# Patient Record
Sex: Male | Born: 1940
Health system: Southern US, Community
[De-identification: ages and names within clinical notes are randomized; demographics above are authoritative.]

## PROBLEM LIST (undated history)

## (undated) DIAGNOSIS — B029 Zoster without complications: Secondary | ICD-10-CM

## (undated) DIAGNOSIS — I639 Cerebral infarction, unspecified: Secondary | ICD-10-CM

## (undated) DIAGNOSIS — Z8619 Personal history of other infectious and parasitic diseases: Secondary | ICD-10-CM

## (undated) DIAGNOSIS — I10 Essential (primary) hypertension: Secondary | ICD-10-CM

## (undated) DIAGNOSIS — Z8673 Personal history of transient ischemic attack (TIA), and cerebral infarction without residual deficits: Secondary | ICD-10-CM

## (undated) DIAGNOSIS — N400 Enlarged prostate without lower urinary tract symptoms: Secondary | ICD-10-CM

## (undated) DIAGNOSIS — I252 Old myocardial infarction: Secondary | ICD-10-CM

## (undated) DIAGNOSIS — I2510 Atherosclerotic heart disease of native coronary artery without angina pectoris: Secondary | ICD-10-CM

## (undated) DIAGNOSIS — N529 Male erectile dysfunction, unspecified: Secondary | ICD-10-CM

## (undated) DIAGNOSIS — K449 Diaphragmatic hernia without obstruction or gangrene: Secondary | ICD-10-CM

## (undated) DIAGNOSIS — E785 Hyperlipidemia, unspecified: Secondary | ICD-10-CM

## (undated) HISTORY — PX: CORONARY ANGIOPLASTY: SHX604

## (undated) HISTORY — DX: Personal history of other infectious and parasitic diseases: Z86.19

## (undated) HISTORY — DX: Old myocardial infarction: I25.2

## (undated) HISTORY — DX: Personal history of transient ischemic attack (TIA), and cerebral infarction without residual deficits: Z86.73

## (undated) HISTORY — DX: Hyperlipidemia, unspecified: E78.5

## (undated) HISTORY — DX: Benign prostatic hyperplasia without lower urinary tract symptoms: N40.0

## (undated) HISTORY — PX: CARDIAC CATHETERIZATION: SHX172

## (undated) HISTORY — DX: Male erectile dysfunction, unspecified: N52.9

## (undated) HISTORY — DX: Essential (primary) hypertension: I10

## (undated) HISTORY — DX: Zoster without complications: B02.9

---

## 1998-02-05 ENCOUNTER — Inpatient Hospital Stay (HOSPITAL_COMMUNITY): Admission: EM | Admit: 1998-02-05 | Discharge: 1998-02-09 | Payer: Self-pay | Admitting: Emergency Medicine

## 1998-03-25 ENCOUNTER — Encounter (HOSPITAL_COMMUNITY): Admission: RE | Admit: 1998-03-25 | Discharge: 1998-06-23 | Payer: Self-pay | Admitting: *Deleted

## 1998-06-24 ENCOUNTER — Encounter (HOSPITAL_COMMUNITY): Admission: RE | Admit: 1998-06-24 | Discharge: 1998-09-22 | Payer: Self-pay | Admitting: *Deleted

## 2004-04-13 ENCOUNTER — Ambulatory Visit: Payer: Self-pay | Admitting: Cardiology

## 2004-04-16 ENCOUNTER — Ambulatory Visit: Payer: Self-pay | Admitting: Cardiology

## 2004-05-03 DIAGNOSIS — I639 Cerebral infarction, unspecified: Secondary | ICD-10-CM

## 2004-05-03 HISTORY — DX: Cerebral infarction, unspecified: I63.9

## 2004-08-06 ENCOUNTER — Ambulatory Visit: Payer: Self-pay | Admitting: Cardiology

## 2004-10-23 ENCOUNTER — Ambulatory Visit: Payer: Self-pay | Admitting: Physical Medicine & Rehabilitation

## 2004-10-23 ENCOUNTER — Inpatient Hospital Stay (HOSPITAL_COMMUNITY): Admission: EM | Admit: 2004-10-23 | Discharge: 2004-10-30 | Payer: Self-pay | Admitting: *Deleted

## 2004-10-23 ENCOUNTER — Ambulatory Visit: Payer: Self-pay | Admitting: Cardiology

## 2004-10-23 ENCOUNTER — Encounter: Payer: Self-pay | Admitting: Cardiology

## 2004-10-30 ENCOUNTER — Inpatient Hospital Stay (HOSPITAL_COMMUNITY)
Admission: RE | Admit: 2004-10-30 | Discharge: 2004-12-09 | Payer: Self-pay | Admitting: Physical Medicine & Rehabilitation

## 2004-11-06 ENCOUNTER — Ambulatory Visit: Payer: Self-pay | Admitting: Cardiology

## 2005-01-27 ENCOUNTER — Encounter
Admission: RE | Admit: 2005-01-27 | Discharge: 2005-04-27 | Payer: Self-pay | Admitting: Physical Medicine & Rehabilitation

## 2005-03-17 ENCOUNTER — Ambulatory Visit: Payer: Self-pay | Admitting: Cardiology

## 2005-04-15 ENCOUNTER — Ambulatory Visit: Payer: Self-pay | Admitting: Cardiology

## 2005-07-21 ENCOUNTER — Ambulatory Visit: Payer: Self-pay | Admitting: Cardiology

## 2005-10-13 ENCOUNTER — Ambulatory Visit: Payer: Self-pay | Admitting: Family Medicine

## 2005-11-15 ENCOUNTER — Ambulatory Visit: Payer: Self-pay | Admitting: Internal Medicine

## 2005-12-10 ENCOUNTER — Ambulatory Visit: Payer: Self-pay | Admitting: Cardiology

## 2006-01-17 ENCOUNTER — Ambulatory Visit: Payer: Self-pay | Admitting: Internal Medicine

## 2006-01-28 ENCOUNTER — Ambulatory Visit: Payer: Self-pay | Admitting: Internal Medicine

## 2006-02-16 ENCOUNTER — Emergency Department (HOSPITAL_COMMUNITY): Admission: EM | Admit: 2006-02-16 | Discharge: 2006-02-16 | Payer: Self-pay | Admitting: *Deleted

## 2006-03-17 ENCOUNTER — Ambulatory Visit: Payer: Self-pay | Admitting: Internal Medicine

## 2006-03-17 LAB — CONVERTED CEMR LAB
Albumin: 3.6 g/dL (ref 3.5–5.2)
Alkaline Phosphatase: 84 units/L (ref 39–117)
LDL DIRECT: 206.8 mg/dL
Total Protein: 6.2 g/dL (ref 6.0–8.3)
Triglyceride fasting, serum: 138 mg/dL (ref 0–149)

## 2006-07-14 ENCOUNTER — Ambulatory Visit: Payer: Self-pay | Admitting: Internal Medicine

## 2006-07-14 LAB — CONVERTED CEMR LAB
Eosinophils Absolute: 0.2 10*3/uL (ref 0.0–0.6)
Eosinophils Relative: 2.8 % (ref 0.0–5.0)
Folate: >20 ng/mL
HCT: 42.2 % (ref 39.0–52.0)
LDL Cholesterol: 84 mg/dL (ref 0–99)
Lymphocytes Relative: 30.7 % (ref 12.0–46.0)
Monocytes Absolute: 0.5 10*3/uL (ref 0.2–0.7)
Monocytes Relative: 8.2 % (ref 3.0–11.0)
Neutro Abs: 3.7 10*3/uL (ref 1.4–7.7)
Platelets: 268 10*3/uL (ref 150–400)
RDW: 12.4 % (ref 11.5–14.6)
Triglycerides: 146 mg/dL (ref 0–149)
VLDL: 29 mg/dL (ref 0–40)
WBC: 6.3 10*3/uL (ref 4.5–10.5)

## 2006-08-04 ENCOUNTER — Encounter: Payer: Self-pay | Admitting: Internal Medicine

## 2006-10-10 ENCOUNTER — Ambulatory Visit: Payer: Self-pay | Admitting: Internal Medicine

## 2006-10-10 LAB — CONVERTED CEMR LAB
ALT: 20 units/L (ref 0–40)
Albumin: 3.5 g/dL (ref 3.5–5.2)
Alkaline Phosphatase: 80 units/L (ref 39–117)
Cholesterol: 141 mg/dL (ref 0–200)
LDL Cholesterol: 88 mg/dL (ref 0–99)
PSA: 3.92 ng/mL (ref 0.10–4.00)
Total Bilirubin: 0.5 mg/dL (ref 0.3–1.2)
Total Protein: 5.9 g/dL — ABNORMAL LOW (ref 6.0–8.3)
Triglycerides: 120 mg/dL (ref 0–149)

## 2006-11-28 ENCOUNTER — Telehealth: Payer: Self-pay | Admitting: Internal Medicine

## 2006-12-13 ENCOUNTER — Telehealth (INDEPENDENT_AMBULATORY_CARE_PROVIDER_SITE_OTHER): Payer: Self-pay | Admitting: *Deleted

## 2007-01-05 DIAGNOSIS — I252 Old myocardial infarction: Secondary | ICD-10-CM | POA: Insufficient documentation

## 2007-01-05 DIAGNOSIS — N400 Enlarged prostate without lower urinary tract symptoms: Secondary | ICD-10-CM | POA: Insufficient documentation

## 2007-01-05 DIAGNOSIS — Z86718 Personal history of other venous thrombosis and embolism: Secondary | ICD-10-CM | POA: Insufficient documentation

## 2007-01-05 DIAGNOSIS — G9511 Acute infarction of spinal cord (embolic) (nonembolic): Secondary | ICD-10-CM | POA: Insufficient documentation

## 2007-01-05 DIAGNOSIS — E785 Hyperlipidemia, unspecified: Secondary | ICD-10-CM | POA: Insufficient documentation

## 2007-01-05 DIAGNOSIS — I10 Essential (primary) hypertension: Secondary | ICD-10-CM | POA: Insufficient documentation

## 2007-01-09 ENCOUNTER — Ambulatory Visit: Payer: Self-pay | Admitting: Internal Medicine

## 2007-01-09 DIAGNOSIS — N390 Urinary tract infection, site not specified: Secondary | ICD-10-CM | POA: Insufficient documentation

## 2007-03-06 ENCOUNTER — Telehealth: Payer: Self-pay | Admitting: Internal Medicine

## 2007-03-16 ENCOUNTER — Telehealth (INDEPENDENT_AMBULATORY_CARE_PROVIDER_SITE_OTHER): Payer: Self-pay | Admitting: *Deleted

## 2007-04-11 ENCOUNTER — Telehealth: Payer: Self-pay | Admitting: Internal Medicine

## 2007-05-01 ENCOUNTER — Telehealth: Payer: Self-pay | Admitting: Internal Medicine

## 2007-05-16 ENCOUNTER — Ambulatory Visit: Payer: Self-pay | Admitting: Internal Medicine

## 2007-05-16 DIAGNOSIS — N529 Male erectile dysfunction, unspecified: Secondary | ICD-10-CM

## 2007-05-16 HISTORY — DX: Male erectile dysfunction, unspecified: N52.9

## 2007-05-16 LAB — CONVERTED CEMR LAB
ALT: 25 units/L (ref 0–53)
AST: 22 units/L (ref 0–37)
Alkaline Phosphatase: 76 units/L (ref 39–117)
Bilirubin, Direct: 0.2 mg/dL (ref 0.0–0.3)
Cholesterol: 135 mg/dL (ref 0–200)
LDL Goal: 100 mg/dL
Total CHOL/HDL Ratio: 4.2
VLDL: 23 mg/dL (ref 0–40)

## 2007-06-08 ENCOUNTER — Telehealth: Payer: Self-pay | Admitting: *Deleted

## 2007-08-07 ENCOUNTER — Telehealth: Payer: Self-pay | Admitting: Internal Medicine

## 2007-08-24 ENCOUNTER — Ambulatory Visit: Payer: Self-pay | Admitting: Internal Medicine

## 2007-08-24 DIAGNOSIS — K219 Gastro-esophageal reflux disease without esophagitis: Secondary | ICD-10-CM | POA: Insufficient documentation

## 2007-11-16 ENCOUNTER — Ambulatory Visit: Payer: Self-pay | Admitting: Internal Medicine

## 2007-11-16 DIAGNOSIS — T887XXA Unspecified adverse effect of drug or medicament, initial encounter: Secondary | ICD-10-CM | POA: Insufficient documentation

## 2007-11-16 LAB — CONVERTED CEMR LAB
ALT: 20 units/L (ref 0–53)
AST: 18 units/L (ref 0–37)
Alkaline Phosphatase: 78 units/L (ref 39–117)
Total Protein: 6.1 g/dL (ref 6.0–8.3)

## 2007-11-23 ENCOUNTER — Ambulatory Visit: Payer: Self-pay | Admitting: Internal Medicine

## 2008-02-14 ENCOUNTER — Ambulatory Visit: Payer: Self-pay | Admitting: Internal Medicine

## 2008-02-14 LAB — CONVERTED CEMR LAB
ALT: 20 units/L (ref 0–53)
Bilirubin, Direct: 0.2 mg/dL (ref 0.0–0.3)
Cholesterol: 153 mg/dL (ref 0–200)
HDL: 32.9 mg/dL — ABNORMAL LOW (ref >39.0)
LDL Cholesterol: 97 mg/dL (ref 0–99)
Total CHOL/HDL Ratio: 4.7
Triglycerides: 115 mg/dL (ref 0–149)
VLDL: 23 mg/dL (ref 0–40)

## 2008-02-21 ENCOUNTER — Ambulatory Visit: Payer: Self-pay | Admitting: Internal Medicine

## 2008-05-23 ENCOUNTER — Ambulatory Visit: Payer: Self-pay | Admitting: Internal Medicine

## 2008-05-23 DIAGNOSIS — I2510 Atherosclerotic heart disease of native coronary artery without angina pectoris: Secondary | ICD-10-CM | POA: Insufficient documentation

## 2008-05-23 LAB — CONVERTED CEMR LAB
AST: 21 units/L (ref 0–37)
Alkaline Phosphatase: 80 units/L (ref 39–117)
BUN: 19 mg/dL (ref 6–23)
Basophils Relative: 0.1 % (ref 0.0–3.0)
Bilirubin, Direct: 0.1 mg/dL (ref 0.0–0.3)
CRP, High Sensitivity: <1 — ABNORMAL LOW (ref 0.00–5.00)
Calcium: 9.3 mg/dL (ref 8.4–10.5)
Cholesterol: 156 mg/dL (ref 0–200)
Creatinine, Ser: 0.8 mg/dL (ref 0.4–1.5)
GFR calc Af Amer: 125 mL/min
GFR calc non Af Amer: 103 mL/min
Glucose, Bld: 80 mg/dL (ref 70–99)
HCT: 42.1 % (ref 39.0–52.0)
LDL Cholesterol: 93 mg/dL (ref 0–99)
Lymphocytes Relative: 30.7 % (ref 12.0–46.0)
MCHC: 34.4 g/dL (ref 30.0–36.0)
Neutro Abs: 3.1 10*3/uL (ref 1.4–7.7)
PSA: 0.79 ng/mL (ref 0.10–4.00)
RDW: 12.1 % (ref 11.5–14.6)
Sodium: 141 meq/L (ref 135–145)
Total Bilirubin: 0.9 mg/dL (ref 0.3–1.2)
VLDL: 24 mg/dL (ref 0–40)
WBC: 5.5 10*3/uL (ref 4.5–10.5)

## 2008-05-29 ENCOUNTER — Encounter: Payer: Self-pay | Admitting: Internal Medicine

## 2008-05-29 ENCOUNTER — Ambulatory Visit: Payer: Self-pay

## 2008-06-27 ENCOUNTER — Telehealth: Payer: Self-pay | Admitting: *Deleted

## 2008-10-17 ENCOUNTER — Ambulatory Visit: Payer: Self-pay | Admitting: Internal Medicine

## 2008-10-17 DIAGNOSIS — E538 Deficiency of other specified B group vitamins: Secondary | ICD-10-CM | POA: Insufficient documentation

## 2008-10-17 LAB — CONVERTED CEMR LAB
Albumin: 3.8 g/dL (ref 3.5–5.2)
Alkaline Phosphatase: 81 units/L (ref 39–117)
Basophils Relative: 0 % (ref 0.0–3.0)
CO2: 28 meq/L (ref 19–32)
Direct LDL: 95.5 mg/dL
Eosinophils Absolute: 0.2 10*3/uL (ref 0.0–0.7)
Eosinophils Relative: 3 % (ref 0.0–5.0)
GFR calc non Af Amer: 102.82 mL/min (ref >60)
HDL: 38.8 mg/dL — ABNORMAL LOW (ref >39.00)
Lymphocytes Relative: 30 % (ref 12.0–46.0)
Monocytes Absolute: 0.5 10*3/uL (ref 0.1–1.0)
Monocytes Relative: 8.5 % (ref 3.0–12.0)
Potassium: 4.2 meq/L (ref 3.5–5.1)
RDW: 12.1 % (ref 11.5–14.6)
Sodium: 140 meq/L (ref 135–145)
Total Bilirubin: 0.9 mg/dL (ref 0.3–1.2)

## 2009-02-18 ENCOUNTER — Ambulatory Visit: Payer: Self-pay | Admitting: Internal Medicine

## 2009-02-18 LAB — CONVERTED CEMR LAB
Basophils Relative: 0.5 % (ref 0.0–3.0)
Lymphocytes Relative: 28.2 % (ref 12.0–46.0)
Lymphs Abs: 1.8 10*3/uL (ref 0.7–4.0)
MCHC: 34.3 g/dL (ref 30.0–36.0)
Monocytes Absolute: 0.5 10*3/uL (ref 0.1–1.0)
Neutro Abs: 4 10*3/uL (ref 1.4–7.7)
Neutrophils Relative %: 59.4 % (ref 43.0–77.0)
Platelets: 232 10*3/uL (ref 150.0–400.0)
RBC: 4.7 M/uL (ref 4.22–5.81)
RDW: 12.3 % (ref 11.5–14.6)
WBC: 6.5 10*3/uL (ref 4.5–10.5)

## 2009-04-05 ENCOUNTER — Telehealth: Payer: Self-pay | Admitting: Family Medicine

## 2009-04-07 ENCOUNTER — Ambulatory Visit: Payer: Self-pay | Admitting: Internal Medicine

## 2009-04-07 ENCOUNTER — Encounter (INDEPENDENT_AMBULATORY_CARE_PROVIDER_SITE_OTHER): Payer: Self-pay | Admitting: *Deleted

## 2009-04-07 ENCOUNTER — Telehealth: Payer: Self-pay | Admitting: Internal Medicine

## 2009-04-07 DIAGNOSIS — G909 Disorder of the autonomic nervous system, unspecified: Secondary | ICD-10-CM | POA: Insufficient documentation

## 2009-04-07 DIAGNOSIS — B029 Zoster without complications: Secondary | ICD-10-CM

## 2009-04-07 DIAGNOSIS — Z8619 Personal history of other infectious and parasitic diseases: Secondary | ICD-10-CM

## 2009-04-07 HISTORY — DX: Personal history of other infectious and parasitic diseases: Z86.19

## 2009-04-07 HISTORY — DX: Zoster without complications: B02.9

## 2009-04-08 ENCOUNTER — Telehealth: Payer: Self-pay | Admitting: Internal Medicine

## 2009-04-21 ENCOUNTER — Telehealth (INDEPENDENT_AMBULATORY_CARE_PROVIDER_SITE_OTHER): Payer: Self-pay | Admitting: *Deleted

## 2009-04-21 ENCOUNTER — Telehealth: Payer: Self-pay | Admitting: Internal Medicine

## 2009-07-07 ENCOUNTER — Telehealth: Payer: Self-pay | Admitting: Internal Medicine

## 2009-08-19 ENCOUNTER — Ambulatory Visit: Payer: Self-pay | Admitting: Internal Medicine

## 2009-08-19 LAB — CONVERTED CEMR LAB
ALT: 22 units/L (ref 0–53)
Albumin: 3.9 g/dL (ref 3.5–5.2)
Alkaline Phosphatase: 80 units/L (ref 39–117)
BUN: 14 mg/dL (ref 6–23)
Basophils Absolute: 0 10*3/uL (ref 0.0–0.1)
Basophils Relative: 0.6 % (ref 0.0–3.0)
Chloride: 105 meq/L (ref 96–112)
Eosinophils Absolute: 0.2 10*3/uL (ref 0.0–0.7)
Eosinophils Relative: 3.4 % (ref 0.0–5.0)
GFR calc non Af Amer: 89.52 mL/min (ref >60)
HDL: 44.3 mg/dL (ref >39.00)
Lymphocytes Relative: 29.3 % (ref 12.0–46.0)
Lymphs Abs: 1.8 10*3/uL (ref 0.7–4.0)
Platelets: 232 10*3/uL (ref 150.0–400.0)
Total Bilirubin: 0.5 mg/dL (ref 0.3–1.2)
Total Protein: 6.6 g/dL (ref 6.0–8.3)
WBC: 6 10*3/uL (ref 4.5–10.5)

## 2009-10-31 ENCOUNTER — Ambulatory Visit: Payer: Self-pay | Admitting: Internal Medicine

## 2009-12-09 ENCOUNTER — Telehealth: Payer: Self-pay | Admitting: *Deleted

## 2009-12-10 ENCOUNTER — Encounter: Payer: Self-pay | Admitting: Internal Medicine

## 2010-02-16 ENCOUNTER — Ambulatory Visit: Payer: Self-pay | Admitting: Internal Medicine

## 2010-02-16 LAB — CONVERTED CEMR LAB: Direct LDL: 90.2 mg/dL

## 2010-02-25 DIAGNOSIS — G9519 Other vascular myelopathies: Secondary | ICD-10-CM | POA: Insufficient documentation

## 2010-03-03 ENCOUNTER — Telehealth: Payer: Self-pay | Admitting: Internal Medicine

## 2010-03-05 ENCOUNTER — Telehealth: Payer: Self-pay | Admitting: Internal Medicine

## 2010-04-20 ENCOUNTER — Encounter: Payer: Self-pay | Admitting: Internal Medicine

## 2010-04-24 ENCOUNTER — Telehealth: Payer: Self-pay | Admitting: Internal Medicine

## 2010-05-08 ENCOUNTER — Encounter: Payer: Self-pay | Admitting: Internal Medicine

## 2010-06-02 NOTE — Miscellaneous (Signed)
Summary: Orders Update  Clinical Lists Changes  Orders: Added new Service order of UA Dipstick w/Micro (automated) (81001) - Signed  Appended Document: Orders Update  Laboratory Results   Urine Tests    Routine Urinalysis   Color: yellow Appearance: Clear Glucose: negative   (Normal Range: Negative) Bilirubin: negative   (Normal Range: Negative) Ketone: negative   (Normal Range: Negative) Spec. Gravity: 1.020   (Normal Range: 1.003-1.035) Blood: trace-lysed   (Normal Range: Negative) pH: 7.0   (Normal Range: 5.0-8.0) Protein: negative   (Normal Range: Negative) Urobilinogen: 0.2   (Normal Range: 0-1) Nitrite: negative   (Normal Range: Negative) Leukocyte Esterace: trace   (Normal Range: Negative)    Comments: Rita Ohara  October 31, 2009 2:33 PM

## 2010-06-02 NOTE — Progress Notes (Signed)
Summary:  fyi---requesting rx  Phone Note Call from Patient Call back at Home Phone [phone] Call back at [phone]   Caller: Spouse---triage vm Summary of Call: medical supply office will be contacting the office for rx of catherters. he would need a 90 day supply, because he uses them everyday. Initial call taken by: Warnell Forester,  March 03, 2010 2:59 PM  Follow-up for Phone Call        ok Follow-up by: Willy Eddy, LPN,  March 03, 2010 3:01 PM

## 2010-06-02 NOTE — Medication Information (Signed)
Summary: Geophysical data processor   Imported By: Maryln Gottron 12/11/2009 14:49:48  _____________________________________________________________________  External Attachment:    Type:   Image     Comment:   External Document

## 2010-06-02 NOTE — Progress Notes (Signed)
Summary: WC cushion  Phone Note From Other Clinic   Caller: [facility],DME provider,[phone] Summary of Call: Patient requesting WC cusion change,other one worn out according to wife.  Faxing request.   Initial call taken by: Rudy Jew, RN,  December 09, 2009 1:24 PM

## 2010-06-02 NOTE — Assessment & Plan Note (Signed)
Summary: 6 month follow up/cjr   Vital Signs:  Patient profile:   70 year old male Temp:     98.2 degrees F oral Pulse rate:   72 / minute Pulse rhythm:   regular Resp:     14 per minute BP sitting:   124 / 78  (left arm)  Vitals Entered By: Willy Eddy, LPN (August 19, 2009 10:16 AM) CC: roa, Hypertension Management, Lipid Management   CC:  roa, Hypertension Management, and Lipid Management.  History of Present Illness: Follow up still participating the PT mood is stable blood pressure is stable  Hypertension History:      He denies headache, chest pain, palpitations, dyspnea with exertion, orthopnea, PND, peripheral edema, visual symptoms, neurologic problems, syncope, and side effects from treatment.        Positive major cardiovascular risk factors include male age 41 years old or older, hyperlipidemia, and hypertension.  Negative major cardiovascular risk factors include negative family history for ischemic heart disease and non-tobacco-user status.        Positive history for target organ damage include ASHD (either angina/prior MI/prior CABG) and prior stroke (or TIA).  Further assessment for target organ damage reveals no history of peripheral vascular disease.    Lipid Management History:      Positive NCEP/ATP III risk factors include male age 58 years old or older, HDL cholesterol less than 40, hypertension, ASHD (either angina/prior MI/prior CABG), and prior stroke (or TIA).  Negative NCEP/ATP III risk factors include no family history for ischemic heart disease, non-tobacco-user status, no peripheral vascular disease, and no history of aortic aneurysm.      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 One By Mouth Three Times A Day As Needed 2)  Neurpath-B 3-35-2 Mg Tabs (L-Methylfolate-B6-B12) .Marland Kitchen.. 1 Two Times A Day 3)  Bl Aspirin 325 Mg  Tabs (Aspirin) .... Once Daily 4)  Metoprolol Tartrate 25 Mg  Tabs (Metoprolol Tartrate) .... 1/2 By Mouth Two Times A Day 5)  Viagra 100 Mg  Tabs (Sildenafil Citrate) .... One By Mouth Daily Prn 6)  Cipro 500 Mg  Tabs (Ciprofloxacin Hcl) .... Two Times A Day As Needed 7)  Omeprazole 20 Mg  Tbec (Omeprazole) .Marland Kitchen.. 1 Once Daily 8)  Zocor 40 Mg  Tabs (Simvastatin) .... One By Mouth  Daily 9)  Medrol (Pak) 4 Mg Tabs (Methylprednisolone) .... Take As Directed For 5 Days 10)  Bactroban 2 % Oint (Mupirocin) .... Apply Two Times A Day & As Needed To Affected Area 11)  Amitriptyline Hcl 25 Mg Tabs (Amitriptyline Hcl) .... One By Mouth Q Hs  Allergies (verified): No Known Drug Allergies  Past History:  Family History: Last updated: 05/16/2007 Family History of Alcoholism/Addiction Family History High cholesterol Family History Hypertension Family History of Cardiovascular disorder  father in 25's  Social History: Last updated: 01/05/2007 Occupation: Self employed Married Former Smoker  Risk Factors: Smoking Status: quit (08/19/2009) Passive Smoke Exposure: no (08/19/2009)  Past medical, surgical, family and social histories (including risk factors) reviewed, and no changes noted (except as noted below).  Past  Medical History: Reviewed history from 05/16/2007 and no changes required. Hyperlipidemia Hypertension Benign prostatic hypertrophy Cerebrovascular accident, hx of DVT, hx of Myocardial infarction, hx of CVA  Past Surgical History: Reviewed history from 01/09/2007 and no changes required. Denies surgical history  Family History: Reviewed history from 05/16/2007 and no changes required. Family History of Alcoholism/Addiction Family History High cholesterol Family History Hypertension Family History of Cardiovascular disorder  father in 31's  Social History: Reviewed history from 01/05/2007 and no changes required. Occupation: Self employed Married Former Smoker  Review of Systems  The patient denies anorexia, fever, weight loss, weight gain, vision loss, decreased hearing, hoarseness, chest pain, syncope, dyspnea on exertion, peripheral edema, prolonged cough, headaches, hemoptysis, abdominal pain, melena, hematochezia, severe indigestion/heartburn, hematuria, incontinence, genital sores, muscle weakness, suspicious skin lesions, transient blindness, difficulty walking, depression, unusual weight change, abnormal bleeding, enlarged lymph nodes, angioedema, breast masses, and testicular masses.    Physical Exam  General:  alert and pale.  well-developed.   Head:  atraumatic and male-pattern balding.   Eyes:  pupils equal, pupils round, and pupils reactive to light.   Ears:  R ear normal and L ear normal.   Nose:  rash with vesicles Neck:  No deformities, masses, or tenderness noted. Lungs:  Normal respiratory effort, chest expands symmetrically. Lungs are clear to auscultation, no crackles or wheezes. Heart:  normal rate and regular rhythm.   Abdomen:  Bowel sounds positive,abdomen soft and non-tender without masses, organomegaly or hernias noted.   Impression & Recommendations:  Problem # 1:  HYPERTENSION (ICD-401.9) Assessment Improved  His updated medication list for  this problem includes:    Metoprolol Tartrate 25 Mg Tabs (Metoprolol tartrate) .Marland Kitchen... 1/2 by mouth two times a day  BP today: 124/78 Prior BP: 130/80 (04/07/2009)  Prior 10 Yr Risk Heart Disease: N/A (01/09/2007)  Labs Reviewed: K+: 4.2 (10/17/2008) Creat: : 0.8 (10/17/2008)   Chol: 150 (10/17/2008)   HDL: 38.80 (10/17/2008)   LDL: 93 (05/23/2008)   TG: 120 (05/23/2008)  Orders: TLB-BMP (Basic Metabolic Panel-BMET) (80048-METABOL) Prescription Created Electronically [phone])  Problem # 2:  GERD (ICD-530.81) Assessment: Unchanged  has to take the priolosec daily but if overeat or lays down too soon has increased symtoms His updated medication list for this problem includes:    Omeprazole 20 Mg Tbec (Omeprazole) .Marland Kitchen... 1 once daily  Labs Reviewed: Hgb: 14.7 (02/18/2009)   Hct: 42.9 (02/18/2009)  Orders: Venipuncture (56213) TLB-CBC Platelet - w/Differential (85025-CBCD)  Problem # 3:  HYPERLIPIDEMIA (ICD-272.4)  His updated medication list for this problem includes:    Zocor 40 Mg Tabs (Simvastatin) ..... One by mouth  daily  Labs Reviewed: SGOT: 18 (10/17/2008)   SGPT: 22 (10/17/2008)  Lipid Goals: Chol Goal: 200 (05/16/2007)   HDL Goal: 40 (05/16/2007)   LDL Goal: 100 (05/16/2007)   TG Goal: 150 (05/16/2007)  Prior 10 Yr Risk Heart Disease: N/A (01/09/2007)   HDL:38.80 (10/17/2008), 38.8 (05/23/2008)  LDL:93 (05/23/2008), 97 (02/14/2008)  Chol:150 (10/17/2008), 156 (05/23/2008)  Trig:120 (05/23/2008), 115 (02/14/2008)  Orders: TLB-Cholesterol, HDL (83718-HDL) TLB-Cholesterol, Direct LDL (83721-DIRLDL) TLB-Cholesterol,  Total (82465-CHO)  Complete Medication List: 1)  Antivert 25 Mg Tabs (Meclizine hcl) .... One by mouth three times a day as needed 2)  Neurpath-b 3-35-2 Mg Tabs (L-methylfolate-b6-b12) .Marland Kitchen.. 1 two times a day 3)  Bl Aspirin 325 Mg Tabs (Aspirin) .... Once daily 4)  Metoprolol Tartrate 25 Mg Tabs (Metoprolol tartrate) .... 1/2 by mouth two times a day 5)   Viagra 100 Mg Tabs (Sildenafil citrate) .... One by mouth daily prn 6)  Cipro 500 Mg Tabs (Ciprofloxacin hcl) .... Two times a day as needed 7)  Omeprazole 20 Mg Tbec (Omeprazole) .Marland Kitchen.. 1 once daily 8)  Zocor 40 Mg Tabs (Simvastatin) .... One by mouth  daily 9)  Medrol (pak) 4 Mg Tabs (Methylprednisolone) .... Take as directed for 5 days 10)  Bactroban 2 % Oint (Mupirocin) .... Apply two times a day & as needed to affected area 11)  Amitriptyline Hcl 25 Mg Tabs (Amitriptyline hcl) .... One by mouth q hs  Other Orders: TLB-Hepatic/Liver Function Pnl (80076-HEPATIC)  Hypertension Assessment/Plan:      The patient's hypertensive risk group is category C: Target organ damage and/or diabetes.  Today's blood pressure is 124/78.  His blood pressure goal is < 140/90.  Lipid Assessment/Plan:      Based on NCEP/ATP III, the patient's risk factor category is "history of coronary disease, peripheral vascular disease, cerebrovascular disease, or aortic aneurysm".  The patient's lipid goals are as follows: Total cholesterol goal is 200; LDL cholesterol goal is 100; HDL cholesterol goal is 40; Triglyceride goal is 150.  His LDL cholesterol goal has been met.    Patient Instructions: 1)  will send a cup home for ua 2)  Please schedule a follow-up appointment in 6 months. Prescriptions: ZOCOR 40 MG  TABS (SIMVASTATIN) ONE by mouth  DAILY  #90 x 2   Entered by:   Willy Eddy, LPN   Authorized by:   [name] MD   Signed by:   Willy Eddy, LPN on 81/19/1478   Method used:   [facility]        [facility] (retail)       [address]       [place], [place]  [address]       Ph: [phone] or [phone]       Fax: [phone]   RxID:   [id number] CIPRO 500 MG  TABS (CIPROFLOXACIN HCL) two times a day as needed  #20 Tablet x 3   Entered by:   Willy Eddy, LPN   Authorized by:   [name] MD   Signed by:   Willy Eddy, LPN on 87/56/4332    Method used:   [facility]        [id number] (retail)       [address]       [place], [place]  [address]       Ph: [phone] or [phone]       Fax: [phone]   RxID:   [id number] METOPROLOL TARTRATE 25 MG  TABS (METOPROLOL TARTRATE) 1/2 by mouth two times a day  #90 Tablet x 2   Entered by:   Willy Eddy, LPN   Authorized by:   [name] MD   Signed by:   Willy Eddy, LPN on 10/27/9483   Method used:   [facility]        [facility] [phone][facility] (retail)  [address]       [place], [place]  [address]       Ph: [phone] or [phone]       Fax: [phone]   RxID:   [id number] NEURPATH-B 3-35-2 MG TABS (L-METHYLFOLATE-B6-B12) 1 two times a day  #60 x 5   Entered by:   Willy Eddy, LPN   Authorized by:   [name] MD   Signed by:   Willy Eddy, LPN on 27/25/3664   Method used:   [facility]        [facility]  [place] (retail)       [address]       [place], [place]  [address]       Ph: [phone] or [phone]       Fax: [phone]   RxID:   [id number]

## 2010-06-02 NOTE — Progress Notes (Signed)
Summary: Call-A-Nurse Report  Phone Note Call from Patient   Reason for Call: Acute Illness Summary of Call: talked with wife and they request cipr be refilled - cipro was refilled  Initial call taken by: Willy Eddy, LPN,  July 07, 2009 7:58 AM      Call-A-Nurse Triage Call Report Triage Record Num: [id number] Operator: Coralee [place] Patient Name: [facility] Call Date & Time: 07/04/2009 7:01:05PM Patient Phone: [phone] PCP: Darryll Capers Patient Gender: Male PCP Fax : [phone] Patient DOB: 1941-02-06 Practice Name: [facility] Reason for Call: Calling about having urinary tract symptoms. Onset: 3/04, afebrile. Cloudy urine. Pt self caths. Bactrim DS per standing order [facility] [address] @ [phone] and spoke with pharmacist. Dr Karleen Hampshire Copland on call. Pt advised to f/u with office on monday Protocol(s) Used: Urinary Symptoms / Prostate Problems Recommended Outcome per Protocol: Call Provider Immediately Reason for Outcome: Current or recent urinary tract instrumentation (e.g., temporary catheter) AND urinary tract symptoms OR no urine flow Care Advice:  ~ IMMEDIATE ACTION 07/04/2009 7:18:19PM Page 1 of 1 [email or web address]

## 2010-06-02 NOTE — Progress Notes (Signed)
Summary: Evaluation form  Phone Note Call from Patient   Reason for Call: Talk to Nurse, Lab or Test Results Summary of Call: Patient's wife would like wheelchair evaluation form faxed to: [phone] Atten: Diannia Ruder.  Thank you Initial call taken by: Everrett Coombe,  March 05, 2010 9:35 AM  Follow-up for Phone Call        faxed Follow-up by: Willy Eddy, LPN,  March 05, 2010 9:43 AM

## 2010-06-02 NOTE — Progress Notes (Signed)
Summary:  lab results request  Phone Note Call from Patient   Caller: Spouse Call For: Joseph Hayes Summary of Call: Pt wife called, Metoprolol 25 mg recalled, her husbands Rx wa filled with Toprol.  FYI Also, they are waiting for results of lab work done in January. Best number [phone], message left on machine OK Initial call taken by: Sid Falcon LPN,  June 08, 2007 9:31 AM  Follow-up for Phone Call        to correct thing the message is stated in the opposite... toprol has been recalled and metoprolol is the substitue Follow-up by: [name] MD,  June 08, 2007 1:24 PM  Additional Follow-up for Phone Call Additional follow up Details #1::        Pt also requesting results of labs done in January    Additional Follow-up for Phone Call Additional follow up Details #2::    my give copy of results they were all good Follow-up by: [name] MD,  June 08, 2007 3:32 PM  Additional Follow-up for Phone Call Additional follow up Details #3:: Details for Additional Follow-up Action Taken: left message on machine all labs wnl Additional Follow-up by: Willy Eddy, LPN,  June 08, 2007 3:56 PM

## 2010-06-02 NOTE — Assessment & Plan Note (Signed)
Summary: 6 month rov/njr   Vital Signs:  Patient profile:   70 year old male Temp:     98.2 degrees F oral Pulse rate:   72 / minute Pulse rhythm:   regular Resp:     14 per minute BP sitting:   120 / 80  (left arm)  Vitals Entered By: Willy Eddy, LPN (February 16, 2010 9:23 AM) CC: roa, Hypertension Management Is Patient Diabetic? No   Primary Care Provider:  [name] MD  CC:  roa and Hypertension Management.  History of Present Illness: follow up  the pt has been on zocar for lipids he has been in continupous rehab for CVA blood pressure control excelent the pt has been taking viagra 1/2 every other day  and this has helped his muscle?!   Hypertension History:      He denies headache, chest pain, palpitations, dyspnea with exertion, orthopnea, PND, peripheral edema, visual symptoms, neurologic problems, syncope, and side effects from treatment.        Positive major cardiovascular risk factors include male age 13 years old or older, hyperlipidemia, and hypertension.  Negative major cardiovascular risk factors include negative family history for ischemic heart disease and non-tobacco-user status.        Positive history for target organ damage include ASHD (either angina/prior MI/prior CABG) and prior stroke (or TIA).  Further assessment for target organ damage reveals no history of peripheral vascular disease.     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) 2)  Shingles  (ICD-053.9) 3)  Vitamin B12 Deficiency  (ICD-266.2) 4)  Cad  (ICD-414.00) 5)  Uns Advrs Eff Uns Rx Medicinal&biological Sbstnc  (ICD-995.20) 6)  Gerd  (ICD-530.81) 7)  Erectile Dysfunction, Organic  (ICD-607.84) 8)  Uti's, Recurrent  (ICD-599.0) 9)  Family History of Alcoholism/addiction  (ICD-V61.41) 10)  Myocardial Infarction, Hx  of  (ICD-412) 11)  Dvt, Hx of  (ICD-V12.51) 12)  Cerebrovascular Accident, Hx of  (ICD-V12.50) 13)  Benign Prostatic Hypertrophy  (ICD-600.00) 14)  Hypertension  (ICD-401.9) 15)  Hyperlipidemia  (ICD-272.4)  Current Problems (verified): 1)  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) 2)  Shingles  (ICD-053.9) 3)  Vitamin B12 Deficiency  (ICD-266.2) 4)  Cad  (ICD-414.00) 5)  Uns Advrs Eff Uns Rx Medicinal&biological Sbstnc  (ICD-995.20) 6)  Gerd  (ICD-530.81) 7)  Erectile Dysfunction, Organic  (ICD-607.84) 8)  Uti's, Recurrent  (ICD-599.0) 9)  Family History of Alcoholism/addiction  (ICD-V61.41) 10)  Myocardial Infarction, Hx of  (ICD-412) 11)  Dvt, Hx of  (ICD-V12.51) 12)  Cerebrovascular Accident, Hx of  (ICD-V12.50) 13)  Benign Prostatic Hypertrophy  (ICD-600.00) 14)  Hypertension  (ICD-401.9) 15)  Hyperlipidemia  (ICD-272.4)  Medications Prior to Update: 1)  Antivert 25 Mg  Tabs (Meclizine Hcl) .... One By Mouth Three Times A Day As Needed 2)  Neurpath-B 3-35-2 Mg Tabs (L-Methylfolate-B6-B12) .Marland Kitchen.. 1 Two Times A Day 3)  Bl Aspirin 325 Mg  Tabs (Aspirin) .... Once Daily 4)  Metoprolol Tartrate 25 Mg  Tabs (Metoprolol Tartrate) .... 1/2 By Mouth Two Times A Day 5)  Viagra 100 Mg  Tabs (Sildenafil Citrate) .... One By Mouth Daily Prn 6)  Cipro 500 Mg  Tabs (Ciprofloxacin Hcl) .... Two Times A Day As Needed 7)  Omeprazole 20 Mg  Tbec (Omeprazole) .Marland Kitchen.. 1 Once  Daily 8)  Zocor 40 Mg  Tabs (Simvastatin) .... One By Mouth  Daily 9)  Medrol (Pak) 4 Mg Tabs (Methylprednisolone) .... Take As Directed For 5 Days 10)  Bactroban 2 % Oint (Mupirocin) .... Apply Two Times A Day & As Needed To Affected Area 11)  Amitriptyline Hcl 25 Mg Tabs (Amitriptyline Hcl) .... One By Mouth Q Hs  Current Medications (verified): 1)  Antivert 25 Mg  Tabs (Meclizine Hcl) .... One By Mouth Three Times A Day As Needed 2)  Neurpath-B 3-35-2 Mg Tabs (L-Methylfolate-B6-B12) .Marland Kitchen.. 1 Two Times A Day 3)  Bl Aspirin 325 Mg  Tabs  (Aspirin) .... Once Daily 4)  Metoprolol Tartrate 25 Mg  Tabs (Metoprolol Tartrate) .... 1/2 By Mouth Two Times A Day 5)  Viagra 100 Mg  Tabs (Sildenafil Citrate) .... One By Mouth Daily Prn 6)  Cipro 500 Mg  Tabs (Ciprofloxacin Hcl) .... Two Times A Day As Needed 7)  Omeprazole 20 Mg  Tbec (Omeprazole) .Marland Kitchen.. 1 Once Daily 8)  Zocor 40 Mg  Tabs (Simvastatin) .... One By Mouth  Daily 9)  Medrol (Pak) 4 Mg Tabs (Methylprednisolone) .... Take As Directed For 5 Days 10)  Bactroban 2 % Oint (Mupirocin) .... Apply Two Times A Day & As Needed To Affected Area 11)  Amitriptyline Hcl 25 Mg Tabs (Amitriptyline Hcl) .... One By Mouth Q Hs  Allergies (verified): No Known Drug Allergies  Past History:  Family History: Last updated: 05/16/2007 Family History of Alcoholism/Addiction Family History High cholesterol Family History Hypertension Family History of Cardiovascular disorder  father in 58's  Social History: Last updated: 01/05/2007 Occupation: Self employed Married Former Smoker  Risk Factors: Smoking Status: quit (02/16/2010) Passive Smoke Exposure: no (02/16/2010)  Past medical, surgical, family and social histories (including risk factors) reviewed, and no changes noted (except as noted below).  Past Medical History: Reviewed history from 05/16/2007 and no changes required. Hyperlipidemia Hypertension Benign prostatic hypertrophy Cerebrovascular accident, hx of DVT, hx of Myocardial infarction, hx of CVA  Past Surgical History: Reviewed history from 01/09/2007 and no changes required. Denies surgical history  Family History: Reviewed history from 05/16/2007 and no changes required. Family History of Alcoholism/Addiction Family History High cholesterol Family History Hypertension Family History of Cardiovascular disorder  father in 58's  Social History: Reviewed history from 01/05/2007 and no changes required. Occupation: Self employed Married Former  Smoker  Review of Systems  The patient denies anorexia, fever, weight loss, weight gain, vision loss, decreased hearing, hoarseness, chest pain, syncope, dyspnea on exertion, peripheral edema, prolonged cough, headaches, hemoptysis, abdominal pain, melena, hematochezia, severe indigestion/heartburn, hematuria, incontinence, genital sores, muscle weakness, suspicious skin lesions, transient blindness, difficulty walking, depression, unusual weight change, abnormal bleeding, enlarged lymph nodes, angioedema, and breast masses.         Flu Vaccine Consent Questions     Do you have a history of severe allergic reactions to this vaccine? no    Any prior history of allergic reactions to egg and/or gelatin? no    Do you have a sensitivity to the preservative Thimersol? no    Do you have a past history of Guillan-Barre Syndrome? no    Do you currently have an acute febrile illness? no    Have you ever had a severe reaction to latex? no    Vaccine information given and explained to patient? yes    Are you currently pregnant? no    Lot Number:[id number]   Exp Date:10/31/2010   Site Given  Left Deltoid IM   Physical Exam  General:  alert and pale.  well-developed.   Head:  atraumatic and male-pattern balding.   Eyes:  pupils equal, pupils round, and pupils reactive to light.   Ears:  R ear normal and L ear normal.   Nose:  rash with vesicles Mouth:  pharynx pink and moist and no erythema.   Neck:  No deformities, masses, or tenderness noted. Lungs:  Normal respiratory effort, chest expands symmetrically. Lungs are clear to auscultation, no crackles or wheezes. Heart:  normal rate and regular rhythm.   Abdomen:  Bowel sounds positive,abdomen soft and non-tender without masses, organomegaly or hernias noted. Msk:  No deformity or scoliosis noted of thoracic or lumbar spine.   Neurologic:  alert & oriented X3, cranial nerves II-XII intact, analert & oriented X3, cranial nerves II-XII intact,  aphasic, RUE hyporeflexia, and RUE weakness.     Impression & Recommendations:  Problem # 1:  ERECTILE DYSFUNCTION, ORGANIC (ICD-607.84)  His updated medication list for this problem includes:    Viagra 100 Mg Tabs (Sildenafil citrate) ..... One by mouth daily prn  Discussed proper use of medications, as well as side effects.   Problem # 2:  HYPERTENSION (ICD-401.9) Assessment: Unchanged  His updated medication list for this problem includes:    Metoprolol Tartrate 25 Mg Tabs (Metoprolol tartrate) .Marland Kitchen... 1/2 by mouth two times a day  BP today: 120/80 Prior BP: 124/78 (08/19/2009)  Prior 10 Yr Risk Heart Disease: N/A (01/09/2007)  Labs Reviewed: K+: 4.1 (08/19/2009) Creat: : 0.9 (08/19/2009)   Chol: 149 (08/19/2009)   HDL: 44.30 (08/19/2009)   LDL: 93 (05/23/2008)   TG: 120 (05/23/2008)  Problem # 3:  HYPERLIPIDEMIA (ICD-272.4)  His updated medication list for this problem includes:    Zocor 40 Mg Tabs (Simvastatin) ..... One by mouth  daily  Labs Reviewed: SGOT: 18 (08/19/2009)   SGPT: 22 (08/19/2009)  Lipid Goals: Chol Goal: 200 (05/16/2007)   HDL Goal: 40 (05/16/2007)   LDL Goal: 100 (05/16/2007)   TG Goal: 150 (05/16/2007)  Prior 10 Yr Risk Heart Disease: N/A (01/09/2007)   HDL:44.30 (08/19/2009), 38.80 (10/17/2008)  LDL:93 (05/23/2008), 97 (02/14/2008)  Chol:149 (08/19/2009), 150 (10/17/2008)  Trig:120 (05/23/2008), 115 (02/14/2008)  Orders: TLB-Cholesterol, HDL (83718-HDL) TLB-Cholesterol, Direct LDL (83721-DIRLDL) TLB-Cholesterol, Total (82465-CHO)  Complete Medication List: 1)  Antivert 25 Mg Tabs (Meclizine hcl) .... One by mouth three times a day as needed 2)  Neurpath-b 3-35-2 Mg Tabs (L-methylfolate-b6-b12) .Marland Kitchen.. 1 two times a day 3)  Bl Aspirin 325 Mg Tabs (Aspirin) .... Once daily 4)  Metoprolol Tartrate 25 Mg Tabs (Metoprolol tartrate) .... 1/2 by mouth two times a day 5)  Viagra 100 Mg Tabs (Sildenafil citrate) .... One by mouth daily prn 6)  Cipro 500  Mg Tabs (Ciprofloxacin hcl) .... Two times a day as needed 7)  Omeprazole 20 Mg Tbec (Omeprazole) .Marland Kitchen.. 1 once daily 8)  Zocor 40 Mg Tabs (Simvastatin) .... One by mouth  daily 9)  Medrol (pak) 4 Mg Tabs (Methylprednisolone) .... Take as directed for 5 days 10)  Bactroban 2 % Oint (Mupirocin) .... Apply two times a day & as needed to affected area 11)  Amitriptyline Hcl 25 Mg Tabs (Amitriptyline hcl) .... One by mouth q hs  Other Orders: Flu Vaccine 45yrs + MEDICARE PATIENTS (D2202) Administration Flu vaccine - MCR (R4270) TLB-Hepatic/Liver Function Pnl (80076-HEPATIC) Venipuncture (62376)  Hypertension Assessment/Plan:      The patient's hypertensive risk group is category C: Target  organ damage and/or diabetes.  Today's blood pressure is 120/80.  His blood pressure goal is < 140/90.  Patient Instructions: 1)  Please schedule a follow-up appointment in 6 months. 2)  medicare wellness exam 30 min Prescriptions: VIAGRA 100 MG  TABS (SILDENAFIL CITRATE) one by mouth daily prn  #15 x 11   Entered and Authorized by:   [name] MD   Signed by:   [name] MD on 02/16/2010   Method used:   Print then Give to Patient   RxID:   [id number]    Orders Added: 1)  Flu Vaccine 82yrs + MEDICARE PATIENTS [Q2039] 2)  Administration Flu vaccine - MCR [G0008] 3)  Est. Patient Level IV [14782] 4)  TLB-Cholesterol, HDL [83718-HDL] 5)  TLB-Cholesterol, Direct LDL [83721-DIRLDL] 6)  TLB-Cholesterol, Total [82465-CHO] 7)  TLB-Hepatic/Liver Function Pnl [80076-HEPATIC] 8)  Venipuncture [95621]  Appended Document: Orders Update    Clinical Lists Changes  Orders: Added new Service order of Specimen Handling (30865) - Signed      Appended Document: Orders Update    Clinical Lists Changes  Problems: Added new problem of VASCULAR MYELOPATHIES (ICD-336.1) Orders: Added new Referral order of Misc. Referral (Misc. Ref) - Signed      Appended Document: Orders  Update    Clinical Lists Changes  Orders: Added new Referral order of Misc. Referral (Misc. Ref) - Signed

## 2010-06-04 NOTE — Progress Notes (Signed)
  Phone Note Call from Patient   Reason for Call: Lab or Test Results Summary of Call: pt's wife called and states Joseph Hayes has rash like shingles that he had 1 year ago on face- requesting valacyclovir as before- per dr swords ok to send in valacyclovir 1 gm three times a day for 10 days  Initial call taken by: Willy Eddy, LPN,  April 24, 2010 8:44 AM    New/Updated Medications: VALACYCLOVIR HCL 1 GM TABS (VALACYCLOVIR HCL) 1 three times a day for 10 days Prescriptions: VALACYCLOVIR HCL 1 GM TABS (VALACYCLOVIR HCL) 1 three times a day for 10 days  #30 x 0   Entered by:   Willy Eddy, LPN   Authorized by:   [name] MD   Signed by:   Willy Eddy, LPN on 16/02/9603   Method used:   Electronically to        [facility] (retail)       [address]       [place], [place]  [address]       Ph: [phone] or [phone]       Fax: [phone]   RxID:   [id number]

## 2010-06-04 NOTE — Miscellaneous (Signed)
Summary: Initial Assessment and Plan of Care/[facility]  Initial Assessment and Plan of Care/[facility]   Imported By: Maryln Gottron 05/14/2010 09:19:43  _____________________________________________________________________  External Attachment:    Type:   Image     Comment:   External Document

## 2010-06-04 NOTE — Miscellaneous (Signed)
Summary: Initial Assessment/[facility]  Initial Assessment/[facility]   Imported By: Maryln Gottron 04/28/2010 14:32:48  _____________________________________________________________________  External Attachment:    Type:   Image     Comment:   External Document

## 2010-07-15 ENCOUNTER — Other Ambulatory Visit: Payer: Self-pay | Admitting: Internal Medicine

## 2010-08-11 ENCOUNTER — Encounter: Payer: Self-pay | Admitting: Internal Medicine

## 2010-08-26 ENCOUNTER — Encounter: Payer: Self-pay | Admitting: Internal Medicine

## 2010-09-03 ENCOUNTER — Ambulatory Visit (INDEPENDENT_AMBULATORY_CARE_PROVIDER_SITE_OTHER): Payer: Medicare Other | Admitting: Internal Medicine

## 2010-09-03 ENCOUNTER — Other Ambulatory Visit: Payer: Self-pay | Admitting: *Deleted

## 2010-09-03 ENCOUNTER — Encounter: Payer: Self-pay | Admitting: Internal Medicine

## 2010-09-03 VITALS — BP 130/80 | HR 76 | Temp 98.2°F | Resp 16

## 2010-09-03 DIAGNOSIS — Z125 Encounter for screening for malignant neoplasm of prostate: Secondary | ICD-10-CM

## 2010-09-03 DIAGNOSIS — N390 Urinary tract infection, site not specified: Secondary | ICD-10-CM

## 2010-09-03 DIAGNOSIS — E559 Vitamin D deficiency, unspecified: Secondary | ICD-10-CM

## 2010-09-03 DIAGNOSIS — E785 Hyperlipidemia, unspecified: Secondary | ICD-10-CM

## 2010-09-03 DIAGNOSIS — Z0000 Encounter for general adult medical examination without abnormal findings: Secondary | ICD-10-CM

## 2010-09-03 DIAGNOSIS — K219 Gastro-esophageal reflux disease without esophagitis: Secondary | ICD-10-CM

## 2010-09-03 DIAGNOSIS — I10 Essential (primary) hypertension: Secondary | ICD-10-CM

## 2010-09-03 DIAGNOSIS — N400 Enlarged prostate without lower urinary tract symptoms: Secondary | ICD-10-CM

## 2010-09-03 LAB — CBC WITH DIFFERENTIAL/PLATELET
Basophils Absolute: 0 10*3/uL (ref 0.0–0.1)
Eosinophils Relative: 4.7 % (ref 0.0–5.0)
Hemoglobin: 13.9 g/dL (ref 13.0–17.0)
Lymphocytes Relative: 31.1 % (ref 12.0–46.0)
MCHC: 34.2 g/dL (ref 30.0–36.0)
MCV: 90.6 fl (ref 78.0–100.0)
Monocytes Relative: 9.5 % (ref 3.0–12.0)
Neutro Abs: 2.9 10*3/uL (ref 1.4–7.7)
RBC: 4.48 Mil/uL (ref 4.22–5.81)
WBC: 5.3 10*3/uL (ref 4.5–10.5)

## 2010-09-03 LAB — BASIC METABOLIC PANEL
CO2: 26 mEq/L (ref 19–32)
Calcium: 8.9 mg/dL (ref 8.4–10.5)
GFR: 106.85 mL/min (ref >60.00)
Potassium: 4.3 mEq/L (ref 3.5–5.1)
Sodium: 138 mEq/L (ref 135–145)

## 2010-09-03 LAB — LIPID PANEL
Total CHOL/HDL Ratio: 4
Triglycerides: 129 mg/dL (ref 0.0–149.0)
VLDL: 25.8 mg/dL (ref 0.0–40.0)

## 2010-09-03 LAB — HEPATIC FUNCTION PANEL: AST: 21 U/L (ref 0–37)

## 2010-09-03 LAB — TSH: TSH: 1.45 u[IU]/mL (ref 0.35–5.50)

## 2010-09-03 NOTE — Progress Notes (Signed)
  Subjective:    Patient ID: Joseph Hayes, male    DOB: 08/06/1940, 70 y.o.   MRN: [id number]  HPI    Review of Systems     Past Medical History  Diagnosis Date  . Hyperlipidemia   . Hypertension   . BPH (benign prostatic hypertrophy)   . H/O: CVA (cardiovascular accident)   . Acute DVT (deep venous thrombosis)   . MI, old   . SHINGLES 04/07/2009   History reviewed. No pertinent past surgical history.  reports that he quit smoking about 36 years ago. He does not have any smokeless tobacco history on file. He reports that he does not drink alcohol or use illicit drugs. family history includes Alcohol abuse in an unspecified family member; Heart disease in his father; Hyperlipidemia in an unspecified family member; and Hypertension in an unspecified family member. No Known Allergies  Objective:   Physical Exam Blood pressure 130/80, pulse 76, temperature 98.2 F (36.8 C), temperature source Oral, resp. rate 16. The patient is in no apparent distress he is wheelchair bound.  HEENT reveals pupils are equal round reactive to light and accommodation his neck was supple lung fields are clear his heart examination showed a regular rate and rhythm with 1/6 systolic murmur his abdomen is soft and nontender examination of his neck reveals no adenopathy there are skin tags present around the neck there is no axillary adenopathy present.  Examination as is his extremities reveals contractures and the right upper extremity. He is alert and oriented percent place and time and appropriate judgment and behavior examination of his prostate revealed it to be normal in size and architecture without masses rectal tone was decreased         Assessment & Plan:   Patient presents for yearly preventative medicine examination.   all immunizations and health maintenance protocols were reviewed with the patient and they are up to date with these protocols.   screening laboratory values were ordered  with the patient including screening of hyperlipidemia PSA renal function and hepatic function.   There medications past medical history social history problem list and allergies were reviewed in detail.   Goals were established with regard to weight loss exercise diet in compliance with medications His blood pressure is well controlled on his current medications will monitor a lipid panel to see that it is lipid as well controlled he has a history of CAD but no current complaints risk factor stratification includes blood pressure and lipids which will be monitored today his blood pressure stable History of multiple urinary tract infections due to an in out catheter from the dystonic bladder right now he is without symptoms and is afebrile

## 2010-09-04 LAB — VITAMIN D 25 HYDROXY (VIT D DEFICIENCY, FRACTURES): Vit D, 25-Hydroxy: 33 ng/mL (ref 30–89)

## 2010-09-09 NOTE — Progress Notes (Signed)
  Subjective:    Patient ID: Joseph Hayes, male    DOB: Jan 28, 1941, 70 y.o.   MRN: [id number]  HPI    Review of Systems     Objective:   Physical Exam        Assessment & Plan:  On the visit dated 09/03/2010, this patient was evaluated for power mobility needs.  He has mobility limitations due to  cva (v12.50) which has limited his upper body , and vascular myelopathies(336.1) that prevent accomplishing ADL'S.  This cannot be resolved by use of a fitted cane or walker.  This patient does not have sufficient arm function to propel a wheelchair to perform mobility related ADL's.  This patient could benefit from use of specifically configured power wheelchair and can safely transfer to and from a power wheelchair and operate the controls.

## 2010-09-10 ENCOUNTER — Other Ambulatory Visit: Payer: Self-pay | Admitting: *Deleted

## 2010-09-10 MED ORDER — CYANOCOBALAMIN 500 MCG SL SUBL: 1.0000 | SUBLINGUAL_TABLET | Freq: Every day | SUBLINGUAL | Status: DC

## 2010-09-10 MED ORDER — FOLIC ACID 1 MG PO TABS: 1.0000 mg | ORAL_TABLET | Freq: Every day | ORAL | Status: DC

## 2010-09-18 NOTE — Consult Note (Signed)
NAME:  Joseph Hayes, Joseph Hayes NO.:  [id number]   MEDICAL RECORD NO.:  [id number]          PATIENT TYPE:  EMS   LOCATION:  [facility]                         FACILITY:  [facility]   PHYSICIAN:  Rollene Rotunda, M.D.   DATE OF BIRTH:  12-Dec-1940   DATE OF CONSULTATION:  10/23/2004  DATE OF DISCHARGE:                                   CONSULTATION   PRIMARY CARE PHYSICIAN:  None.   CARDIOLOGIST:  Salvadore Farber, M.D.   REASON FOR CONSULTATION:  Evaluate patient with chest pain.   HISTORY OF PRESENT ILLNESS:  Patient is a pleasant 70 year old gentleman  with a past history of inferior myocardial infarction and stenting of his  right coronary artery in October of 1999.  He had done very well since then.  He did have a stress perfusion study in 2002 which was negative for any  evidence of ischemia.  He gets along well and works hard.  He says he never  gets any cardiac symptoms.  Recently he has been feeling well and does not  get any chest discomfort, neck discomfort, arm discomfort, activity induced  nausea, vomiting, excessive diaphoresis.  He has had no palpitations,  presyncope or syncope.  He denies any PND or orthopnea.  He does  occasionally get reflux.   PAST MEDICAL HISTORY:  Today he was driving and developed very severe (he  described it as 25/10) chest discomfort.  He initially described it as a  chest pressure.  It was slightly to the right side of his sternum.  He did  have radiation to his right shoulder down into his right arm with numbness.  He stopped and went into a place of business and asked some folks to call an  ambulance.  I unfortunately, do not have the ambulance records.  He came to  the emergency room where he was in significant pain.  The initial EKG did  not show any acute changes.  He was given morphine.  Soon after this, he  developed hypotension with blood pressures of 95.  We were called at that  point.  He was diaphoretic.  When I arrived, he was  in Trendelenburg.  He  was being resuscitated with fluid.  He responded very quickly to all of  this. His symptoms resolved and he became completely pain-free.  His blood  pressure rose quickly into the 160s.  He had 2/10 chest discomfort at that  point.  Chest x-ray was unremarkable.  The initial plan was to take him for  cardiac catheterization.  He was given bolus of heparin.  At this same time,  he was noted to have weakness in his arms and hands.  He had completely lost  grip strength.  Plans for cardiac catheterization were halted and a code  stroke was called.   ALLERGIES:  There was a questionable angioedema to ACE INHIBITORS and ARBs.   MEDICATIONS:  1.  Norvasc 2.5 mg daily.  2.  Toprol 25 mg daily.  3.  Vytorin 10/80.  4.  Aspirin 81 mg daily.  5.  Prilosec.   PAST  MEDICAL HISTORY:  1.  Inferior myocardial infarction with stent to his right coronary artery      in 1999.  2.  Hyperlipidemia.  3.  Hypertension.   PAST SURGICAL HISTORY:  None.   SOCIAL HISTORY:  [place]  [place].  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  He has two sons and one grandchild.  He has not smoked  in 30 years.  He does not drink alcohol.   FAMILY HISTORY:  Contributory for one sibling with a stent.   REVIEW OF SYMPTOMS:  As stated in the HPI and negative for all other  systems.   PHYSICAL EXAMINATION:  GENERAL APPEARANCE:  The patient is in no distress.  VITAL SIGNS:  Blood pressure 160/70, heart rate 54 and regular.  HEENT:  Eyes unremarkable.  Pupils are equal, round and reactive to light.  Fundi not visualized.  NECK:  No jugular venous distension.  Wave form within normal limits.  Carotid upstroke brisk and symmetric, no bruits, no thyromegaly.  LYMPHATICS:  No cervical, axillary or inguinal adenopathy.  LUNGS:  Clear to auscultation bilaterally.  BACK:  No costovertebral anklets.  CHEST:   Unremarkable.  CARDIOVASCULAR:  PMI not displaced or sustained.  S1 and S2 within normal  limits with no S3, no S4, no murmurs.  ABDOMEN:  Flat, positive bowel sounds, normal in frequency and pitch.  No  bruits, no rebound, no guarding, no midline pulsatile masses.  No  hepatomegaly, splenomegaly.  SKIN:  No rashes, no nodules.  EXTREMITIES:  There are 2+ pulses throughout.  No clubbing, cyanosis, or  edema.  NEUROLOGIC:  Oriented to person, place and time.  Cranial nerves II-XII  grossly intact.  Motor grossly intact throughout.   Chest x-ray with COPD and otherwise unremarkable.   LABORATORY DATA:  WBC 7.9, hemoglobin 14.2, platelets 289.  Sodium 138,  potassium 3.9, BUN 19, creatinine 1.0.  AST 23, ALT 21.  INR 1.0.   EKG with sinus rhythm, rate 61, axis within normal limits, intervals within  normal limits, no acute STT wave changes.   ASSESSMENT/PLAN:  1.  Chest:  The patient's chest discomfort is worrisome for dissection.  The      plan will be consultation with Dr. Sandria Manly.  Will have a low threshold for      angiogram of his aorta.  At this point, he is pain-free, however, and      with his neurologic deficits, cannot be taken to the cardiac      catheterization lab.  He will have further evaluation and suggestions      per Dr. Sandria Manly.  We have discontinued all his blood thinners.  Currently      he remains only on saline      and is comfortable.  Will continue to cycle enzymes and follow serial      EKGs while neurologic work-up is ongoing.  2.  Dyslipidemia.  He will continue his Statin and have this followed.       JH/MEDQ  D:  10/23/2004  T:  10/23/2004  Job:  [id number]   cc:   Salvadore Farber, M.D. [facility]  [address]  [address]  [place]  [place] [address]   Genene Churn. Love, M.D.  [address]  [address]  [place]  [place] [address]  Fax: [phone]

## 2010-09-18 NOTE — Discharge Summary (Signed)
NAME:  Joseph Hayes, Joseph Hayes NO.:  [id number]   MEDICAL RECORD NO.:  [id number]          PATIENT TYPE:  IPS   LOCATION:  4003                         FACILITY:  [facility]   PHYSICIAN:  Ellwood Dense, M.D.   DATE OF BIRTH:  09/19/1940   DATE OF ADMISSION:  10/30/2004  DATE OF DISCHARGE:  12/08/2004                                 DISCHARGE SUMMARY   DISCHARGE DIAGNOSES:  1.  Status post spinal cord infarction, C4-T1, with paraplegia.  2.  Pain management.  3.  Subcutaneous Lovenox for deep venous thrombosis.  4.  Hypertension.  5.  Hyperlipidemia.  6.  Escherichia coli urinary tract infection.  7.  Coronary artery disease with percutaneous transluminal coronary      angioplasty.   HISTORY OF PRESENT ILLNESS:  This is a 70 year old right-handed white male  who was admitted October 23, 2004 with chest pain and weakness in his legs and  hands; cranial CT scan was negative.  CT angiogram negative for pulmonary  emboli or aortic dissection.  MR angiogram of the head with questionable  tiny aneurysm of the right internal carotid artery and the cavernous  segment.  A followup MRI of the brain after cranial CT scan was negative.  MRI of cervical spine with left-sided neural foraminal narrowing of C3-4,  right-sided neural foraminal narrowing, C6-7.  No inter-cord abnormality.  Repeat MRI of cervical spine with non-enhancing area, C4 to T1-2 in the  central portion of the cord most consistent with ischemic cord.  Neurology  followup with Dr. Sandria Manly.  Hospital course for hypotension with workup per Dr.  Samule Ohm; the patient did receive intravenous fluids at that time.  Subcutaneous Lovenox for deep venous thrombosis prophylaxis.  Toprol was  held on October 24, 2004 for hypotension and bradycardia, and at that time, the  patient was transferred to [facility].  Sodium chloride added to help  maintain blood pressure for a short time.  Patient had completed steroid  protocol.  As well as  completion of Cipro, October 30, 2004, for an  Enterococcus urinary tract infection.   PAST MEDICAL HISTORY:  See discharge diagnoses.   HABITS:  No alcohol.  Remote smoker.   SOCIAL HISTORY:  Lives with his wife in [place], wife can assist on  discharge, 1-level home, 2 steps to entry, local son in the area.   ALLERGIES:  None.   MEDICATIONS PRIOR TO ADMISSION:  1.  Norvasc 2.5 mg daily.  2.  Toprol-XL 25 mg daily.  3.  Vytorin 10/40 daily.  4.  Ecotrin 81 mg daily.  5.  Prilosec 20 mg daily.   HOSPITAL COURSE:  Patient with progressive gains while on Rehab Services  with therapies initiated on a b.i.d. basis.  The following issues were  followed during patient's rehab course:  Pertaining to Mr. Hingle spinal  cord infarction, C4-T1, with paraplegia, followup ongoing per neurology  services of Dr. Sandria Manly.  Latest followup films, MRI, of cervical spine, November 17, 2004, showed persistent abnormal signal in the cord from C4 to T1  ventrally, felt most likely to represent cord  infarction.  MRI of thoracic  spine showed no disk pathology, no abnormality seen between T1 level.  His  paraplegia remained essentially unchanged; he would continue to be followed  by Neurology Services as well as outpatient Rehab Services as advised.  Noted neurogenic bladder per cord infarction, family and patient independent  with intermittent catheterizations as needed.  During his rehab course, he  was treated for an E. coli urinary tract infection with Cipro as well as  recently completing a course of Cipro for an Enterococcus urinary tract  infection on October 30, 2004.  He remained on subcutaneous Lovenox for deep  venous thrombosis prophylaxis.  His calves remained cool without any  swelling or erythema and nontender.  Pain management with the addition of  Neurontin of 600 mg three times daily and the addition of Lyrica 75 mg daily  on December 04, 2004.  His blood pressure and heart rate remained  monitored  with no present antihypertensive medication.  His Toprol had recently been  held on October 24, 2004 for bouts of bradycardia; heart rates ranged from 70-  74, diastolic pressures 52-75.  He remained on his Vytorin for history of  hyperlipidemia.  Overall for his functional status, the patient remained  motivated, attending full therapies.  He continued to progress in all areas.  He was presently  minimum-to-moderate assist for bed mobility, max assist  for car transfers; he did have a power wheelchair in the room.  He was able  to use the phone at supervision level, set up for upper body activities of  daily living; minimal assist, lower body activities of daily living.  Home  health therapies had been arranged.  Full family teaching completed.  Day  passes did go well.  He was discharged to home.   Latest labs showed a hemoglobin of 13.1, hematocrit 37.7, platelets 264,000,  WBC of 7.7; sodium 137, potassium 4.2, BUN 25, creatinine 0.8.   DISCHARGE MEDICATIONS AT TIME OF DICTATION:  1.  Neurontin 600 mg three times daily.  2.  Lyrica 75 mg daily.  3.  Ecotrin 81 mg daily.  4.  Vytorin 10/40 daily.  5.  Vitamin B12 1000 mcg intramuscularly every 7 days.  6.  Protonix 40 mg daily.  7.  Senokot tablets two every 48 hours.  8.  Dulcolax suppository 10 mg every 48 hours.  9.  Cipro 250 mg twice daily until December 10, 2004 to complete for E. coli      urinary tract infection.  10. Ultram 40 mg four times daily as needed -- pain.   ACTIVITY:  Activity as tolerated.  Home health physical and occupational  therapy.   DISCHARGE INSTRUCTIONS:  Intermittent catheterizations every 6 hours.   DIET:  Regular.   FOLLOWUP:  Follow up with Dr. Ellwood Dense, Rehab Services, as advised,  Dr. Sandria Manly, Neurology Services.      Danie   DA/MEDQ  D:  12/07/2004  T:  12/07/2004  Job:  [id number]   cc:   Genene Churn. Love, M.D.  [address]  [address] [place]  [place] [address]  Fax:  [phone]   [facility]   Salvadore Farber, M.D. [facility]  [address]  [address]  [place]  [place] [address]

## 2010-09-18 NOTE — Op Note (Signed)
NAME:  Joseph Hayes, Joseph Hayes NO.:  [id number]   MEDICAL RECORD NO.:  [id number]          PATIENT TYPE:  INP   LOCATION:  3111                         FACILITY:  [facility]   PHYSICIAN:  Genene Churn. Love, M.D.    DATE OF BIRTH:  [birth year]   DATE OF PROCEDURE:  10/23/2004  DATE OF DISCHARGE:                                 OPERATIVE REPORT   CLINICAL INDICATIONS:  This patient has a history of a progressive  quadriparesis secondary to cervical myelopathy.   PROCEDURE NOTE:  The patient was prepped and draped in the left lateral  decubitus position using Betadine and 1% Xylocaine.  The L3-L4 interspace  was entered without difficulty.  Opening pressure was 240 mm of water and  clear, colorless CSF was obtained and sent for studies including RPR,  protein and glucose, cell count and diff, IgG, and oligoclonal IgG.  One  tube was to be held.  Patient tolerated the procedure well.       JML/MEDQ  D:  10/23/2004  T:  10/24/2004  Job:  [id number]

## 2010-09-18 NOTE — H&P (Signed)
NAME:  Joseph Hayes, Joseph Hayes NO.:  [id number]   MEDICAL RECORD NO.:  [id number]          PATIENT TYPE:  EMS   LOCATION:  MAJO                         FACILITY:  [facility]   PHYSICIAN:  Genene Churn. Love, M.D.    DATE OF BIRTH:  1940/08/05   DATE OF ADMISSION:  10/23/2004  DATE OF DISCHARGE:                                HISTORY & PHYSICAL   HISTORY OF PRESENT ILLNESS:  This is one of several [facility]  admissions for this 70 year old right-handed white male, down from  [place], [place], admitted for evaluation of chest pain and  bilateral arm weakness.  Mr. Costlow has no known history of stroke but does  have a history of known atherosclerotic vascular disease and hyperlipidemia.  He had a myocardial infarction February 05, 1998 and underwent at [facility] with 2  stents placed at that time.  This morning while driving his car about 5:62  in the morning, he noted the onset of severe chest pain 25/10 and came to  the emergency room.  In the emergency room, he received nitroglycerin,  heparin, morphine, metoprolol.  He had a drop in his blood pressure and was  placed in reverse Trendelenburg position.  His blood pressure was documented  to be 95/60 range.  He noted right arm and bilateral hand weakness without  headache, syncope, double vision, hiccups, swallowing problems, nausea and  vomiting, etc.   PAST MEDICAL HISTORY:  Significant for atherosclerotic vascular disease with  myocardial infarction and stents x2 October of 1999.  He has had  gastroesophageal reflux disease.   ALLERGIES:  He has no known history of allergies.   CURRENT MEDICATIONS:  Norvasc 2.5 mg daily, Toprol XL 25 mg daily, Vytorin  10/80 one daily, Ecotrin 81 mg daily, Prilosec 20 mg daily.   SOCIAL HISTORY:  He does not smoke cigarettes or drink alcohol.  He finished  the 11th grade in school.  He is a Visual merchandiser.   FAMILY HISTORY:  Medical history reveals that his mother is 29 living and  well, his father is 46 living and well.  He has brothers 66, 93, and 63  living and well.   PHYSICAL EXAMINATION:  Examination revealed a well-developed white male,  blood pressure right and left arm lying 140/80, heart rate was 69.  There  were no bruits.  NECK:  Supple.  MENTAL STATUS:  He was alert and oriented x3.  There was no aphasia.  His  cranial nerve exam revealed right ptosis, his extraocular movements full, no  nystagmus.  Visual fields full.  _________, corneals present.  Facial  sensation equal.  Tongue midline, uvula midline, gags present.  He had  decreased ability to shrug his right shoulder.  Motor examination:  He could  lift his right arm off the bed.  He had significant triceps weakness and  weakness in the deltoids and interspinous and supraspinous region.  He could  not grip with his right hand.  He had decreased grip also with his left hand  and could not open either right or left hand well.  He  had decreased  pinprick in the right hand and arm, more so than the left, extending to the  forearm.  He  had deep tendon reflexes which were 2+ in the upper and lower  extremity, except for absent right triceps jerk.  Right plantar responses  downgoing. left plantar responses upgoing.  GENERAL EXAMINATION:  Tympanic membranes were clear, mouth was in good  repair.  LUNGS:  Clear to auscultation.  HEART:  Without murmurs.  Bowel  sounds normal.  Good pulses in the upper and lower extremities.   LABORATORIES:  CT scan of the brain which was normal.  Chest x-ray showed chronic obstructive pulmonary disease and enlarged heart  without edema.   IMPRESSION:  1.  Chest pain rule out aortic dissection. 786.5  2.  Bicerebral or spinal cord findings.  434.01 and 724.1.   Plan at this time is to do a CT angiogram and consider MRI study of the  brain and cervical spine thereafter.  He is not a candidate for tPA, having  received heparin.       JML/MEDQ  D:  10/23/2004   T:  10/23/2004  Job:  [id number]

## 2010-09-18 NOTE — Discharge Summary (Signed)
NAME:  Joseph Hayes, Joseph Hayes NO.:  [id number]   MEDICAL RECORD NO.:  [id number]          PATIENT TYPE:  INP   LOCATION:  3012                         FACILITY:  [facility]   PHYSICIAN:  Genene Churn. Love, M.D.    DATE OF BIRTH:  February 06, 1941   DATE OF ADMISSION:  10/23/2004  DATE OF DISCHARGE:  10/30/2004                                 DISCHARGE SUMMARY   This is one of several [facility] admissions for this 70 year old  right-handed white married male from [place], [place], admitted  10/23/04 for evaluation of a sudden onset of chest pain extending into his  right arm and subsequent weakness in his hands and legs.   HISTORY OF PRESENT ILLNESS:  Mr. Memoli has no known prior history of stroke.  He does have a known history of atherosclerotic vascular disease with a  myocardial infarction 02/05/98 for which he underwent [facility] with two stents  placed at that time.  He has had hyperlipidemia.  The morning of admission  about 6:45 in the morning he noted the onset of severe chest pain described  as a 25/10 and came to the emergency room with pain radiating into his  right shoulder and right arm.  In the emergency room he received  nitroglycerin, heparin, morphine, and was to receive metoprolol, but had a  drop in his blood pressure.  He was placed in reversed Trendelenburg  position.  He was seen by Dr. Antoine Poche and noted to have evidence of right  arm weakness and bilateral hand weakness.  His blood pressure dropped to the  lowest of 95/60 which was recorded.  The patient noted right arm and  bilateral leg and hand weakness without headache, syncope, double vision,  hiccups, swallowing problems, nausea and vomiting.   PAST MEDICAL HISTORY:  Significant for atherosclerotic vascular disease with  myocardial infarction and stents xc2 in 10/99.  He has a known history of  gastroesophageal reflux disease.  He has no known history of allergies.   CURRENT MEDICATIONS:  1.  Norvasc 2.5 mg daily.  2.  Toprol XL 25 mg daily.  3.  Vytorin 10/80 one daily.  4.  Ecotrin 81 mg daily.  5.  Prilosec 20 mg daily.   He did not smoke cigarettes or drink alcohol.  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   FAMILY HISTORY:  Has been reported elsewhere.   PHYSICAL EXAMINATION:  Blood pressure right arm and left arm lying 140/80,  heart rate was 69.  His mental status was normal.  Cranial nerve examination  revealed mild right ptosis.  Motor examination revealed a significant  weakness in the right triceps, bilateral hand weakness, and weakness in his  legs, but he could still lift his legs off the bed when first seen by me.  Subsequently he could not lift his legs off the bed.  He had 2+ reflexes in  the upper and lower extremities.  He had an absent right triceps jerk.  He  had right plantar response which was downgoing, left plantar response which  was upgoing.  GENERAL:  Unremarkable.   LABORATORY DATA:  His initial sodium was 138, potassium 3.9, chloride 105,  CO2 27, glucose 121, BUN 19, creatinine 1.0.  Calcium 8.9, total protein  5.8, albumin 3.7.  AST 23, ALT 21, LP 77, total bilirubin 0.9.  CK was 210,  CK-MB 4.0, relative index 1.9, troponin 1 was less than 0.01.  Subsequent  studies were normal of CK and CK-MB.  B12 was 293.  His initial urinalysis  was yellow, clear, trace hemoglobin, many bacteria, 0-2 white blood cells, 0-  2 red blood cells.  Subsequent urinalysis showed 7-10 whit blood cells, 0-2  red blood cells, hyaline cast were noted.  He had a urine culture which was  positive for Enterococcus species sensitive to ampicillin and levofloxacin,  nitrofurantoin, and vancomycin.  PT was 13.6, INR 1.0, PTT 23.  ESR was 3.  His white blood cell count was 7900, hemoglobin 14.2, hematocrit 41.6,  platelet count 289 K.  Differential was 74% polys, 17% lymphs, 6% monocytes,  3% eosinophils, 1% basophil.  Subsequent white blood cell  counts after the  use of high dose steroids were 14,000, 13,700, 10,900, but subsequently  8900.  His repeat electrolytes were normal in the hospital.  HIV testing was  nonreactive.  CSF showed protein of 56 which was high.  CSF glucose was in  the 80s.  He had one red blood cell, no white blood cells.  ACE in the fluid  was 1.3 which was normal.  His VDRL in the CSF was nonreactive.  CSF IgG and  oligoclonal IgG are still pending.  Methylmalonic acid level is still  pending.  Serum RPR was nonreactive.  ANA was negative.   His radiology procedures revealed a CT angiogram of the chest showed sliding  type hiatal hernia with dilated fluid filled esophagus, question of reflux  esophagitis.  No evidence of pulmonary embolus, aortic dissection.  No  evidence for infiltrates.  No compression fractures.  CT angiogram of the  abdomen showed numerous water density cysts in the abdomen.  Again, there  was no evidence of a dissection present.  Portable chest x-ray showed  cardiomegaly, pulmonary versus hypertension, congestion, hiatal hernia  noted, probable COPD.  Initial MRI study of the brain showed no acute  infarction, nonspecific white matter changes.  MR angiogram of the head  without contrast showed question of tiny aneurysm of right internal carotid  artery in the cavernous segment as noted above, slight narrowing distal of  the right vertebral artery, poor delineation of right pica.  MRI of the  cervical spine showed left-sided neuroforaminal narrowing at 3-4, right-  sided neuroforaminal narrowing at C6-7.  No intercord abnormality.  Subsequent repeat MRI of the cervical spine showed a nonenhancing area from  C4 through T1-2 in the central portion of the cord, most consistent with an  ischemic cord.  This was from C4-T1.  Myelitis was also another  consideration though considered less likely.  Further notes on the abdominal aortic angiography revealed a complex cystic lesion in the right  kidney  associated with 11 x 16 mm calcification and sliding hiatal hernia.  EKG  showed sinus bradycardia.   HOSPITAL COURSE:  The patient was seen in the emergency room with  development of acute onset of right triceps bilateral hand weakness and  progressive lower extremity weakness secondary to cord abnormality.  He was  initially treated with high dose steroids with spinal cord injury protocol  without any  definite benefit.  In the hospital he complained of coldness in  his hands.  He had a Foley catheter placed for his bladder.  He had episodes  of hypotension noted for which he received IV saline and was placed on salt  and abdominal binder.  Strength did not improve in his legs, possibly some  strength improvement in his right triceps and left triceps.  At the time of  transfer deltoid, infraspinatus, supraspinatus, subscapularis, biceps, and  brachioradialis were in the 5/5 range bilaterally.  He had a 2/5 strength  right triceps, 4/5 left triceps.  Wrist extensor is 3/5 on the right, 4/5 on  the left.  Wrist flexor is 0/5 on the right, 1/5 on the left.  Abductor  pollicis brevis 0/5 on the right, 1/5 on the left.  Abductor pollicis brevis  0/0.  Extensor pollicis 0/0 bilaterally.  Finger flexors 0/5 on the right  and 1/5 on the left.  He had 0/5 strength in the iliopsoas, hamstrings,  adductor magnus, gluteus medius, gluteus maximus, anterior tibialis, EHL,  and gastroc-soleus muscle groups.  He had joint position and fibration  intact in the extremities.  He had decreased pinprick to C5 on the right and  the left.  He had 2+ reflexes in the upper extremities except 0 right  triceps jerk.  He had 1+ lower extremity reflexes.  The right plantar  response seemed to be downgoing.  Left plantar response was upgoing.   CURRENT MEDICATIONS:  1.  Lovenox 40.  2.  Toprol XL 25.  3.  Aspirin 81.  4.  Zetia 10.  5.  Zocor 80.  6.  Senokot.  7.  Protonix.  8.  Dulcolax suppository  every other day.  9.  B12 shots because of a low B12 level.  10. Salt tablets two t.i.d.  11. Gabapentin 200 t.i.d. for coldness in his hands.  12. Sodium chloride.  13. Ambien.   He was seen by OT and PT.  He showed no definite improvement in his lower  extremities.  He had some improvement in his upper extremities.  It was felt  that he most likely had ischemic stroke of the spinal cord.  The etiology of  this was unknown at this time.   IMPRESSION:  1.  C7 motor level, C5 pinprick level with cervical myelopathy, code 721.41,      most likely secondary to ischemia of the cord.  2.  Atherosclerotic vascular disease with known coronary artery disease,      status post stent placement x2 and myocardial infarction in October of      1999, code 429.2.  3.  Gastroesophageal reflux disease with large hiatal hernia.  4.  Hypertension, code 796.2.  5.  Enterococcus urinary tract infection treated with Levaquin in the     hospital, code 599.0.  6.  Hyperlipidemia, code 272.4.   PLAN:  Transfer the patient on above medications to the rehab service.   DISCHARGE MEDICATIONS:  1.  Lovenox 40 mg subcu daily.  2.  Toprol XL 25 mg daily.  3.  Aspirin 81 mg daily.  4.  Zetia 10 mg daily.  5.  Zocor 80 mg daily.  6.  Senokot b.i.d.  7.  Protonix 40 mg daily.  8.  Dulcolax suppository every other day per rectum.  9.  Vitamin B12 shots daily.  10. Salt tablets two p.o. t.i.d.  11. Gabapentin 200 mg t.i.d.  12. Sodium chloride 75 mL IV/h.  13. Ambien  10 mg at night p.r.n.   PENDING STUDIES AT TIME OF DISCHARGE:  Methylmalonic acid levels and CSF IgG  oligoclonal IgG.   Of note is that there is a questionable aneurysm noted on the MRA and that  he had calcification in the cystic area of his right kidney.       JML/MEDQ  D:  10/30/2004  T:  10/30/2004  Job:  [id number]

## 2010-09-18 NOTE — Assessment & Plan Note (Signed)
[facility]                              CARDIOLOGY OFFICE NOTE   Joseph Hayes, Joseph Hayes                      MRN:          [id number]  DATE:12/10/2005                            DOB:          [birth year]    HISTORY OF PRESENT ILLNESS:  Joseph Hayes is a 70 year old gentleman who  suffered inferior myocardial infarction in 1999.  This was treated with  stenting of his right coronary artery using bare-metal stent.  Subsequent  studies have demonstrated normal left ventricular systolic function with an  inferior scarring and no ischemia.  Approximately 1 year ago, he suffered a  spinal cord infarction with resultant paraplegia.  He had extensive  rehabilitation, and is now gaining a bit of strength in his legs, and  substantial strength in his left arm.  He is keeping extremely active.  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   He has not had any chest discomfort, PND, orthopnea, edema, or exertional  dyspnea.   He had stopped his Lipitor out of concern that it may be contributing to a  backsliding and weakness that he had.   CURRENT MEDICATIONS:  1. Aspirin 81 mg per day.  2. Prilosec 20 mg per day.  3. Coenzyme Q10 100 mg per day.  4. Stool softener.  5. [place]or 4 daily.   PHYSICAL EXAMINATION:  GENERAL:  He is generally well-appearing, in no  distress, sitting in a wheelchair.  VITAL SIGNS:  Heart rate is 71, blood pressure 128/76.  He states that his  weight is 180 pounds, and is stable.  He was not weighed due to being in a  wheelchair.  NEUROLOGIC:  Exam is notable for the ability to raise his right thigh a bit.  The right arm has some motion, and the left arm has quite substantial  motion, which is a major improvement over the past several months.  NECK:  He has no jugular venous distention and no thyromegaly.  LUNGS:  Clear to auscultation.  CARDIAC:  He has a nondisplaced point of maximal cardiac impulse.   There is  a regular rate and rhythm without murmur, rub or gallop.  ABDOMEN:  Soft, nondistended and nontender.  There is no hepatosplenomegaly.  Bowel sounds are normal.  EXTREMITIES:  Warm without clubbing, cyanosis, edema or ulceration.  Carotid  pulses are 2+ bilaterally without bruit.   Electrocardiogram today demonstrates normal sinus rhythm and is a normal  EKG.   IMPRESSION/RECOMMENDATIONS:  1. Coronary disease:  Prior inferior myocardial infarction.  Beta blocker      was stopped in the course of rehab out of concern for hypotension.  Now      that he is a number of months out, we will try restarting it.  We will      start Toprol XL 25 mg per day.  We will see him back in 3 months for      consideration of up-titration.  2. Hypertension:  Well-controlled.  3. Hypercholesterolemia:  The patient is understandably unwilling to take  statins out of concern that they may slow his progress on regaining      some strength.  He is willing to take Zetia, after I explained that it      has no impact on his muscular strength.  However, he says that he has      undertaken a great deal of dietary modification over the past several      months, and thinks he may have seen some substantial improvement.  We      will therefore check a lipid profile today, including a direct LDL.  If      the LDL is not at goal, we will plan on starting Zetia alone.   I will plan on seeing him back in three months' time.                                 Salvadore Farber, MD    WED/MedQ  DD:  12/10/2005  DT:  12/11/2005  Job #:  [id number]   cc:   Genene Churn. Love, MD

## 2010-09-18 NOTE — Discharge Summary (Signed)
NAME:  Joseph Hayes, Joseph Hayes NO.:  [id number]   MEDICAL RECORD NO.:  [id number]          PATIENT TYPE:  IPS   LOCATION:  4003                         FACILITY:  [facility]   PHYSICIAN:  Ellwood Dense, M.D.   DATE OF BIRTH:  December 01, 1940   DATE OF ADMISSION:  10/30/2004  DATE OF DISCHARGE:  12/09/2004                                 DISCHARGE SUMMARY   ADDENDUM:  The patient remained stable throughout his rehab stay.  Initial  discharge was set for December 08, 2004, however, this was held until December 09, 2004 in regards to arranging his outpatient therapies and completing family  teaching all remained stable throughout that time.  The case manage was to  followup regarding his outpatient therapies. He would complete his Cipro for  E. Coli urinary tract infection December 10, 2004.  His Lyrica had been  adjusted to 50 mg three times daily, Neurontin decreased to 300 mg three  times daily for pain control with plans to followup with Dr. Ellwood Dense  at the outpatient rehab service office in six weeks.  The remaining  medications remained unchanged.      Danie   DA/MEDQ  D:  12/09/2004  T:  12/09/2004  Job:  [id number]

## 2010-09-30 ENCOUNTER — Ambulatory Visit: Payer: Medicare Other | Admitting: Physical Therapy

## 2010-10-13 ENCOUNTER — Ambulatory Visit: Payer: Medicare Other | Attending: Internal Medicine | Admitting: Physical Therapy

## 2010-10-13 DIAGNOSIS — I69998 Other sequelae following unspecified cerebrovascular disease: Secondary | ICD-10-CM | POA: Insufficient documentation

## 2010-10-13 DIAGNOSIS — IMO0001 Reserved for inherently not codable concepts without codable children: Secondary | ICD-10-CM | POA: Insufficient documentation

## 2010-10-13 DIAGNOSIS — M6281 Muscle weakness (generalized): Secondary | ICD-10-CM | POA: Insufficient documentation

## 2010-10-18 ENCOUNTER — Other Ambulatory Visit: Payer: Self-pay | Admitting: Internal Medicine

## 2010-11-06 ENCOUNTER — Other Ambulatory Visit: Payer: Self-pay | Admitting: Internal Medicine

## 2010-11-23 ENCOUNTER — Other Ambulatory Visit: Payer: Self-pay | Admitting: Internal Medicine

## 2011-01-14 ENCOUNTER — Ambulatory Visit (INDEPENDENT_AMBULATORY_CARE_PROVIDER_SITE_OTHER): Payer: Medicare Other | Admitting: Internal Medicine

## 2011-01-14 VITALS — BP 130/78 | HR 68 | Temp 98.3°F | Resp 14 | Ht 69.0 in | Wt 180.0 lb

## 2011-01-14 DIAGNOSIS — I10 Essential (primary) hypertension: Secondary | ICD-10-CM

## 2011-01-14 DIAGNOSIS — Z8673 Personal history of transient ischemic attack (TIA), and cerebral infarction without residual deficits: Secondary | ICD-10-CM

## 2011-01-14 DIAGNOSIS — Z23 Encounter for immunization: Secondary | ICD-10-CM

## 2011-01-14 DIAGNOSIS — I2510 Atherosclerotic heart disease of native coronary artery without angina pectoris: Secondary | ICD-10-CM

## 2011-01-14 DIAGNOSIS — I951 Orthostatic hypotension: Secondary | ICD-10-CM

## 2011-01-14 NOTE — Progress Notes (Signed)
  Subjective:    Patient ID: Dayton Scrape Calica, male    DOB: [birth year], 70 y.o.   MRN: [id number]  HPI The patient is a 70 year old with hemiparesis from a CVA who presents for followup of hypertension hyperlipidemia and recurrent stroke wrist reduction therapy.  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  His mood is excellent and his continued activity level good.  He is compliant with all his medications denies any current side effects denies any shortness of breath   Review of Systems  Constitutional: Negative for fever and fatigue.  HENT: Negative for hearing loss, congestion, neck pain and postnasal drip.   Eyes: Negative for discharge, redness and visual disturbance.  Respiratory: Negative for cough, shortness of breath and wheezing.   Cardiovascular: Negative for leg swelling.  Gastrointestinal: Negative for abdominal pain, constipation and abdominal distention.  Genitourinary: Negative for urgency and frequency.  Musculoskeletal: Negative for joint swelling and arthralgias.  Skin: Negative for color change and rash.  Neurological: Negative for weakness and light-headedness.  Hematological: Negative for adenopathy.  Psychiatric/Behavioral: Negative for behavioral problems.       Objective:   Physical Exam  Nursing note reviewed. Constitutional: He appears well-developed and well-nourished.  HENT:  Head: Normocephalic and atraumatic.  Eyes: Conjunctivae are normal. Pupils are equal, round, and reactive to light.  Neck: Normal range of motion. Neck supple.  Cardiovascular: Normal rate and regular rhythm.   Pulmonary/Chest: Effort normal and breath sounds normal.  Abdominal: Soft. Bowel sounds are normal.   Past Medical History    Diagnosis Date  . Hyperlipidemia   . Hypertension   . BPH (benign prostatic hypertrophy)   . H/O: CVA (cardiovascular accident)   . Acute DVT (deep venous thrombosis)   . MI, old   . SHINGLES 04/07/2009   No past surgical history on file.  reports that he quit smoking about 36 years ago. He does not have any smokeless tobacco history on file. He reports that he does not drink alcohol or use illicit drugs. family history includes Alcohol abuse in an unspecified family member; Heart disease in his father; Hyperlipidemia in an unspecified family member; and Hypertension in an unspecified family member. No Known Allergies        Assessment & Plan:  Hypertension is stable and is current medications these are reviewed prescriptions were updated.  His history of vitamin B12 deficiency we discussed oral B12 supplementation this is secondary to hope that high levels of vitamin B12 might assist in nerve regeneration but I fear that he has reached the maximum recovery from his stroke and heart attack.  However he is doing well he is functioning appropriately he has no depression and he is able to perform many of the activities of daily living without assistance.  I would not adjust medications at this time refills obese at this pharmacy appropriate screening laboratory data to be ordered

## 2011-01-28 ENCOUNTER — Encounter: Payer: Self-pay | Admitting: Internal Medicine

## 2011-03-19 ENCOUNTER — Encounter: Payer: Self-pay | Admitting: *Deleted

## 2011-03-19 ENCOUNTER — Other Ambulatory Visit: Payer: Self-pay | Admitting: *Deleted

## 2011-03-24 ENCOUNTER — Other Ambulatory Visit: Payer: Self-pay | Admitting: Internal Medicine

## 2011-04-15 ENCOUNTER — Ambulatory Visit: Payer: Medicare Other | Admitting: Internal Medicine

## 2011-04-20 ENCOUNTER — Other Ambulatory Visit: Payer: Self-pay | Admitting: Internal Medicine

## 2011-05-31 ENCOUNTER — Ambulatory Visit (INDEPENDENT_AMBULATORY_CARE_PROVIDER_SITE_OTHER): Payer: Medicare Other | Admitting: Internal Medicine

## 2011-05-31 ENCOUNTER — Encounter: Payer: Self-pay | Admitting: Internal Medicine

## 2011-05-31 DIAGNOSIS — I10 Essential (primary) hypertension: Secondary | ICD-10-CM

## 2011-05-31 DIAGNOSIS — L209 Atopic dermatitis, unspecified: Secondary | ICD-10-CM

## 2011-05-31 DIAGNOSIS — R972 Elevated prostate specific antigen [PSA]: Secondary | ICD-10-CM

## 2011-05-31 DIAGNOSIS — T887XXA Unspecified adverse effect of drug or medicament, initial encounter: Secondary | ICD-10-CM

## 2011-05-31 DIAGNOSIS — E785 Hyperlipidemia, unspecified: Secondary | ICD-10-CM

## 2011-05-31 DIAGNOSIS — L2089 Other atopic dermatitis: Secondary | ICD-10-CM

## 2011-05-31 LAB — LIPID PANEL
Cholesterol: 160 mg/dL (ref 0–200)
HDL: 46.2 mg/dL (ref >39.00)
LDL Cholesterol: 92 mg/dL (ref 0–99)
Total CHOL/HDL Ratio: 3
Triglycerides: 109 mg/dL (ref 0.0–149.0)
VLDL: 21.8 mg/dL (ref 0.0–40.0)

## 2011-05-31 LAB — HEPATIC FUNCTION PANEL
ALT: 21 U/L (ref 0–53)
Bilirubin, Direct: 0.2 mg/dL (ref 0.0–0.3)
Total Bilirubin: 0.6 mg/dL (ref 0.3–1.2)

## 2011-05-31 LAB — CBC WITH DIFFERENTIAL/PLATELET
Basophils Absolute: 0 10*3/uL (ref 0.0–0.1)
Eosinophils Relative: 4.2 % (ref 0.0–5.0)
HCT: 41.9 % (ref 39.0–52.0)
Lymphocytes Relative: 27.4 % (ref 12.0–46.0)
Lymphs Abs: 1.8 10*3/uL (ref 0.7–4.0)
Monocytes Relative: 7.3 % (ref 3.0–12.0)
Neutrophils Relative %: 60.6 % (ref 43.0–77.0)
Platelets: 164 10*3/uL (ref 150.0–400.0)
RDW: 13.3 % (ref 11.5–14.6)
WBC: 6.6 10*3/uL (ref 4.5–10.5)

## 2011-05-31 LAB — PSA: PSA: 0.79 ng/mL (ref 0.10–4.00)

## 2011-05-31 LAB — BASIC METABOLIC PANEL
Chloride: 108 mEq/L (ref 96–112)
Creatinine, Ser: 0.8 mg/dL (ref 0.4–1.5)
GFR: 103.5 mL/min (ref >60.00)

## 2011-05-31 MED ORDER — CLOTRIMAZOLE-BETAMETHASONE 1-0.05 % EX CREA: TOPICAL_CREAM | Freq: Two times a day (BID) | CUTANEOUS | Status: DC

## 2011-05-31 MED ORDER — CLOTRIMAZOLE-BETAMETHASONE 1-0.05 % EX CREA: TOPICAL_CREAM | Freq: Two times a day (BID) | CUTANEOUS | Status: AC

## 2011-05-31 NOTE — Patient Instructions (Signed)
The patient is instructed to continue all medications as prescribed. Schedule followup with check out clerk upon leaving the clinic  

## 2011-05-31 NOTE — Progress Notes (Signed)
Subjective:    Patient ID: Joseph Hayes, male    DOB: [birth year], 71 y.o.   MRN: [id number]  HPI The patient has been going to the pool for therapy His blood pressure is stable No new symptoms monitoring of statin therapy He is a recurring rash on his shoulder that is pleuritic but not painful   Review of Systems  Constitutional: Negative for fever and fatigue.  HENT: Negative for hearing loss, congestion, neck pain and postnasal drip.   Eyes: Negative for discharge, redness and visual disturbance.  Respiratory: Negative for cough, shortness of breath and wheezing.   Cardiovascular: Negative for leg swelling.  Gastrointestinal: Negative for abdominal pain, constipation and abdominal distention.  Genitourinary: Negative for urgency and frequency.  Musculoskeletal: Negative for joint swelling and arthralgias.  Skin: Negative for color change and rash.  Neurological: Negative for weakness and light-headedness.  Hematological: Negative for adenopathy.  Psychiatric/Behavioral: Negative for behavioral problems.   Past Medical History  Diagnosis Date  . Hyperlipidemia   . Hypertension   . BPH (benign prostatic hypertrophy)   . H/O: CVA (cardiovascular accident)   . Acute DVT (deep venous thrombosis)   . MI, old   . SHINGLES 04/07/2009    History   Social History  . Marital Status: Married    Spouse Name: N/A    Number of Children: N/A  . Years of Education: N/A   Occupational History  . self employed    Social History Main Topics  . Smoking status: Former Smoker    Quit date: 05/03/1974  . Smokeless tobacco: Not on file  . Alcohol Use: No  . Drug Use: No  . Sexually Active: Yes   Other Topics Concern  . Not on file   Social History Narrative  . No narrative on file    No past surgical history on file.  Family History  Problem Relation Age of Onset  . Hypertension    . Alcohol abuse    . Hyperlipidemia    . Heart disease Father     No Known  Allergies  Current Outpatient Prescriptions on File Prior to Visit  Medication Sig Dispense Refill  . aspirin 325 MG tablet Take 325 mg by mouth daily.        . ciprofloxacin (CIPRO) 500 MG tablet TAKE 1 TABLET TWICE A DAY AS NEEDED  20 tablet  3  . Cyanocobalamin (B-12 DOTS) 500 MCG SUBL Place 1 tablet (500 mcg total) under the tongue daily.  30 tablet  6  . folic acid (FOLVITE) 1 MG tablet Take 1 tablet (1 mg total) by mouth daily.  30 tablet  11  . meclizine (ANTIVERT) 25 MG tablet Take 25 mg by mouth 3 (three) times daily as needed.        . metoprolol tartrate (LOPRESSOR) 25 MG tablet TAKE HALF TABLET BY MOUTH TWO TIMES A DAY  90 tablet  2  . mupirocin ointment (BACTROBAN) 2 % APPLY TWO TIMES A DAY & AS NEEDED TO AFFECTED AREA  22 g  0  . omeprazole (PRILOSEC) 20 MG capsule Take 20 mg by mouth daily.        . simvastatin (ZOCOR) 40 MG tablet TAKE 1 TABLET EVERY DAY BY MOUTH  90 tablet  2  . VIAGRA 100 MG tablet ONE BY MOUTH DAILY AS NEEDED  5 tablet  9    BP 134/80  Pulse 72  Temp 98.2 F (36.8 C)  Resp 16  Objective:   Physical Exam  Nursing note and vitals reviewed. Constitutional: He appears well-developed and well-nourished.  HENT:  Head: Normocephalic and atraumatic.  Eyes: Conjunctivae are normal. Pupils are equal, round, and reactive to light.  Neck: Normal range of motion. Neck supple.  Cardiovascular: Normal rate and regular rhythm.   Pulmonary/Chest: Effort normal and breath sounds normal.  Abdominal: Soft. Bowel sounds are normal.          Assessment & Plan:  Patient's blood pressure stable today His lipids will be monitored with a lipid panel today his renal function will be assessed on antihypertensive medications we addressed the atopic dermatitis by prescription of Lotrisone to apply twice a day to the site of the rash and we removed 3 skin tags

## 2011-07-30 ENCOUNTER — Other Ambulatory Visit: Payer: Self-pay | Admitting: Internal Medicine

## 2011-09-26 ENCOUNTER — Other Ambulatory Visit: Payer: Self-pay | Admitting: Internal Medicine

## 2011-09-28 ENCOUNTER — Other Ambulatory Visit: Payer: Self-pay | Admitting: Internal Medicine

## 2011-11-29 ENCOUNTER — Encounter: Payer: Self-pay | Admitting: Internal Medicine

## 2011-11-29 ENCOUNTER — Ambulatory Visit (INDEPENDENT_AMBULATORY_CARE_PROVIDER_SITE_OTHER): Payer: Medicare Other | Admitting: Internal Medicine

## 2011-11-29 VITALS — BP 110/80 | HR 72 | Temp 98.2°F | Resp 16 | Ht 69.0 in | Wt 180.0 lb

## 2011-11-29 DIAGNOSIS — K219 Gastro-esophageal reflux disease without esophagitis: Secondary | ICD-10-CM

## 2011-11-29 DIAGNOSIS — E785 Hyperlipidemia, unspecified: Secondary | ICD-10-CM

## 2011-11-29 DIAGNOSIS — Z8679 Personal history of other diseases of the circulatory system: Secondary | ICD-10-CM

## 2011-11-29 DIAGNOSIS — I10 Essential (primary) hypertension: Secondary | ICD-10-CM

## 2011-11-29 LAB — BASIC METABOLIC PANEL
Chloride: 104 mEq/L (ref 96–112)
Creatinine, Ser: 0.8 mg/dL (ref 0.4–1.5)
Sodium: 137 mEq/L (ref 135–145)

## 2011-11-29 MED ORDER — FOLIC ACID 1 MG PO TABS: 1.0000 mg | ORAL_TABLET | Freq: Every day | ORAL | Status: DC

## 2011-11-29 NOTE — Patient Instructions (Signed)
The patient is instructed to continue all medications as prescribed. Schedule followup with check out clerk upon leaving the clinic  

## 2011-11-29 NOTE — Progress Notes (Signed)
Subjective:    Patient ID: Joseph Hayes, male    DOB: 09-21-1940, 71 y.o.   MRN: [id number]  HPI  The patient is stable HTN stable GERD stable No recent UTI's Drinking green tea monitering renal     Review of Systems  HENT: Positive for congestion and rhinorrhea.   Eyes: Negative.   Respiratory: Negative.   Cardiovascular: Negative.   Gastrointestinal: Negative.   Genitourinary:       In and out caths  Musculoskeletal: Positive for gait problem.  Neurological: Positive for weakness.  Psychiatric/Behavioral: Negative for dysphoric mood and decreased concentration.   Past Medical History  Diagnosis Date  . Hyperlipidemia   . Hypertension   . BPH (benign prostatic hypertrophy)   . H/O: CVA (cardiovascular accident)   . Acute DVT (deep venous thrombosis)   . MI, old   . SHINGLES 04/07/2009    History   Social History  . Marital Status: Married    Spouse Name: N/A    Number of Children: N/A  . Years of Education: N/A   Occupational History  . self employed    Social History Main Topics  . Smoking status: Former Smoker    Quit date: 05/03/1974  . Smokeless tobacco: Not on file  . Alcohol Use: No  . Drug Use: No  . Sexually Active: Yes   Other Topics Concern  . Not on file   Social History Narrative  . No narrative on file    No past surgical history on file.  Family History  Problem Relation Age of Onset  . Hypertension    . Alcohol abuse    . Hyperlipidemia    . Heart disease Father     No Known Allergies  Current Outpatient Prescriptions on File Prior to Visit  Medication Sig Dispense Refill  . aspirin 325 MG tablet Take 325 mg by mouth daily.        . ciprofloxacin (CIPRO) 500 MG tablet TAKE 1 TABLET TWICE A DAY AS NEEDED  20 tablet  3  . clotrimazole-betamethasone (LOTRISONE) cream Apply topically 2 (two) times daily.  45 g  1  . [facility] VITAMIN B-12 500 MCG tablet PLACE 1 TABLET UNDER TONGUE DAILY  100 tablet  3  . Cyanocobalamin (B-12  DOTS) 500 MCG SUBL Place 1 tablet (500 mcg total) under the tongue daily.  30 tablet  6  . meclizine (ANTIVERT) 25 MG tablet Take 25 mg by mouth 3 (three) times daily as needed.        . metoprolol tartrate (LOPRESSOR) 25 MG tablet TAKE HALF TABLET BY MOUTH TWO TIMES A DAY  90 tablet  2  . mupirocin ointment (BACTROBAN) 2 % APPLY TWO TIMES A DAY & AS NEEDED TO AFFECTED AREA  22 g  0  . omeprazole (PRILOSEC) 20 MG capsule Take 20 mg by mouth daily.        . simvastatin (ZOCOR) 40 MG tablet TAKE 1 TABLET EVERY DAY BY MOUTH  90 tablet  2  . VIAGRA 100 MG tablet ONE BY MOUTH DAILY AS NEEDED  5 tablet  9    BP 110/80  Pulse 72  Temp 98.2 F (36.8 C)  Resp 16  Ht 5\' 9"  (1.753 m)  Wt 180 lb (81.647 kg)  BMI 26.58 kg/m2       Objective:   Physical Exam  Nursing note and vitals reviewed. Constitutional: He appears well-developed and well-nourished.  HENT:  Head: Normocephalic and atraumatic.  Eyes: Conjunctivae are  normal. Pupils are equal, round, and reactive to light.  Neck: Normal range of motion. Neck supple.  Cardiovascular: Normal rate and regular rhythm.   Pulmonary/Chest: Effort normal and breath sounds normal.  Abdominal: Soft. Bowel sounds are normal.          Assessment & Plan:  Late effects of stroke Sample of ED meds and hard copy RX Callus on ear form pressure

## 2012-02-03 ENCOUNTER — Other Ambulatory Visit: Payer: Self-pay | Admitting: Internal Medicine

## 2012-02-14 ENCOUNTER — Other Ambulatory Visit: Payer: Self-pay | Admitting: Internal Medicine

## 2012-03-10 ENCOUNTER — Telehealth: Payer: Self-pay | Admitting: Internal Medicine

## 2012-03-10 NOTE — Telephone Encounter (Signed)
Pts wife called and said that pt needs rx for catheter faxed to [facility]. The fax # [phone]. Attn Johnathan.  The rx needs to say, 14 french 16" intermittent straight catheters #150 per month with 12 refills to last for 1 yr. Pls call pt if there are any questions.

## 2012-03-13 NOTE — Telephone Encounter (Signed)
Correct fax number is [phone] Faxed again

## 2012-05-29 ENCOUNTER — Ambulatory Visit (INDEPENDENT_AMBULATORY_CARE_PROVIDER_SITE_OTHER): Payer: Medicare Other | Admitting: Internal Medicine

## 2012-05-29 ENCOUNTER — Encounter: Payer: Self-pay | Admitting: Internal Medicine

## 2012-05-29 VITALS — BP 140/80 | HR 72 | Temp 98.6°F | Resp 16 | Ht 69.0 in

## 2012-05-29 DIAGNOSIS — N400 Enlarged prostate without lower urinary tract symptoms: Secondary | ICD-10-CM

## 2012-05-29 DIAGNOSIS — Z8679 Personal history of other diseases of the circulatory system: Secondary | ICD-10-CM

## 2012-05-29 DIAGNOSIS — I10 Essential (primary) hypertension: Secondary | ICD-10-CM

## 2012-05-29 DIAGNOSIS — T887XXA Unspecified adverse effect of drug or medicament, initial encounter: Secondary | ICD-10-CM

## 2012-05-29 DIAGNOSIS — E785 Hyperlipidemia, unspecified: Secondary | ICD-10-CM

## 2012-05-29 LAB — BASIC METABOLIC PANEL
CO2: 27 mEq/L (ref 19–32)
GFR: 78.62 mL/min (ref >60.00)
Glucose, Bld: 86 mg/dL (ref 70–99)
Potassium: 4.4 mEq/L (ref 3.5–5.1)
Sodium: 137 mEq/L (ref 135–145)

## 2012-05-29 LAB — CBC WITH DIFFERENTIAL/PLATELET
Basophils Absolute: 0 10*3/uL (ref 0.0–0.1)
Basophils Relative: 0.7 % (ref 0.0–3.0)
HCT: 38.6 % — ABNORMAL LOW (ref 39.0–52.0)
Hemoglobin: 12.8 g/dL — ABNORMAL LOW (ref 13.0–17.0)
Lymphs Abs: 1.6 10*3/uL (ref 0.7–4.0)
Monocytes Relative: 8.2 % (ref 3.0–12.0)
Neutro Abs: 3.2 10*3/uL (ref 1.4–7.7)
RDW: 13.7 % (ref 11.5–14.6)

## 2012-05-29 LAB — LIPID PANEL
HDL: 47.3 mg/dL (ref >39.00)
Total CHOL/HDL Ratio: 3
VLDL: 24.2 mg/dL (ref 0.0–40.0)

## 2012-05-29 LAB — HEPATIC FUNCTION PANEL: Total Bilirubin: 0.4 mg/dL (ref 0.3–1.2)

## 2012-05-29 NOTE — Progress Notes (Signed)
Subjective:    Patient ID: Joseph Hayes, male    DOB: 12-Dec-1940, 72 y.o.   MRN: [id number]  HPI    Review of Systems  Constitutional: Positive for fatigue. Negative for fever.  HENT: Positive for rhinorrhea and neck stiffness. Negative for hearing loss, congestion, neck pain and postnasal drip.   Eyes: Negative for discharge, redness and visual disturbance.  Respiratory: Negative for cough, shortness of breath and wheezing.   Cardiovascular: Negative for leg swelling.  Gastrointestinal: Negative for abdominal pain, constipation and abdominal distention.  Genitourinary: Negative for urgency and frequency.  Musculoskeletal: Negative for joint swelling and arthralgias.  Skin: Negative for color change and rash.  Neurological: Positive for weakness. Negative for light-headedness.  Hematological: Negative for adenopathy.  Psychiatric/Behavioral: Negative for behavioral problems.   Past Medical History  Diagnosis Date  . Hyperlipidemia   . Hypertension   . BPH (benign prostatic hypertrophy)   . H/O: CVA (cardiovascular accident)   . Acute DVT (deep venous thrombosis)   . MI, old   . SHINGLES 04/07/2009    History   Social History  . Marital Status: Married    Spouse Name: N/A    Number of Children: N/A  . Years of Education: N/A   Occupational History  . self employed    Social History Main Topics  . Smoking status: Former Smoker    Quit date: 05/03/1974  . Smokeless tobacco: Not on file  . Alcohol Use: No  . Drug Use: No  . Sexually Active: Yes   Other Topics Concern  . Not on file   Social History Narrative  . No narrative on file    No past surgical history on file.  Family History  Problem Relation Age of Onset  . Hypertension    . Alcohol abuse    . Hyperlipidemia    . Heart disease Father     No Known Allergies  Current Outpatient Prescriptions on File Prior to Visit  Medication Sig Dispense Refill  . aspirin 325 MG tablet Take 325 mg by  mouth daily.        . ciprofloxacin (CIPRO) 500 MG tablet TAKE 1 TABLET TWICE A DAY AS NEEDED  20 tablet  2  . clotrimazole-betamethasone (LOTRISONE) cream Apply topically 2 (two) times daily.  45 g  1  . CVS VITAMIN B-12 500 MCG tablet PLACE 1 TABLET UNDER TONGUE DAILY  100 tablet  3  . Cyanocobalamin (B-12 DOTS) 500 MCG SUBL Place 1 tablet (500 mcg total) under the tongue daily.  30 tablet  6  . folic acid (FOLVITE) 1 MG tablet Take 1 tablet (1 mg total) by mouth daily.  90 tablet  3  . meclizine (ANTIVERT) 25 MG tablet Take 25 mg by mouth 3 (three) times daily as needed.        . metoprolol tartrate (LOPRESSOR) 25 MG tablet TAKE HALF TABLET BY MOUTH TWO TIMES A DAY  90 tablet  2  . mupirocin ointment (BACTROBAN) 2 % APPLY TWO TIMES A DAY & AS NEEDED TO AFFECTED AREA  22 g  0  . omeprazole (PRILOSEC) 20 MG capsule Take 20 mg by mouth daily.        . simvastatin (ZOCOR) 40 MG tablet TAKE 1 TABLET EVERY DAY BY MOUTH  90 tablet  2  . VIAGRA 100 MG tablet ONE BY MOUTH DAILY AS NEEDED  5 tablet  9    BP 140/80  Pulse 72  Temp 98.6 F (37 C)  Resp 16  Ht 5\' 9"  (1.753 m)       Objective:   Physical Exam  Constitutional: He appears well-developed and well-nourished.  HENT:  Head: Normocephalic and atraumatic.  Eyes: Conjunctivae normal are normal. Pupils are equal, round, and reactive to light.  Neck: Normal range of motion. Neck supple.  Cardiovascular: Normal rate and regular rhythm.   Murmur heard. Pulmonary/Chest: Effort normal and breath sounds normal.  Abdominal: Soft. Bowel sounds are normal.          Assessment & Plan:  Patient stable in medical conditions.  Hypertension is stable hyperlipidemia will be measured today with a lipid and liver.  His a history of gastroesophageal reflux and a CBC differential should be drawn today.  His benign prostatic hypertrophy with need in out catheter in the PSA will be drawn today.  Appropriate followup for screening blood work  through my chart the patient is recommended to initiate my chart protocols that we can communicate him via the Internet

## 2012-05-29 NOTE — Patient Instructions (Signed)
He should sign up for my chart

## 2012-06-01 ENCOUNTER — Other Ambulatory Visit: Payer: Self-pay

## 2012-06-01 ENCOUNTER — Other Ambulatory Visit: Payer: Self-pay | Admitting: Internal Medicine

## 2012-07-09 ENCOUNTER — Encounter (HOSPITAL_COMMUNITY): Payer: Self-pay | Admitting: *Deleted

## 2012-07-09 DIAGNOSIS — B952 Enterococcus as the cause of diseases classified elsewhere: Secondary | ICD-10-CM | POA: Diagnosis present

## 2012-07-09 DIAGNOSIS — I69965 Other paralytic syndrome following unspecified cerebrovascular disease, bilateral: Secondary | ICD-10-CM

## 2012-07-09 DIAGNOSIS — K920 Hematemesis: Principal | ICD-10-CM | POA: Diagnosis present

## 2012-07-09 DIAGNOSIS — Z7982 Long term (current) use of aspirin: Secondary | ICD-10-CM

## 2012-07-09 DIAGNOSIS — N200 Calculus of kidney: Secondary | ICD-10-CM | POA: Diagnosis present

## 2012-07-09 DIAGNOSIS — N390 Urinary tract infection, site not specified: Secondary | ICD-10-CM | POA: Diagnosis present

## 2012-07-09 DIAGNOSIS — R609 Edema, unspecified: Secondary | ICD-10-CM | POA: Diagnosis present

## 2012-07-09 DIAGNOSIS — G8220 Paraplegia, unspecified: Secondary | ICD-10-CM | POA: Diagnosis present

## 2012-07-09 DIAGNOSIS — Z87891 Personal history of nicotine dependence: Secondary | ICD-10-CM

## 2012-07-09 DIAGNOSIS — E876 Hypokalemia: Secondary | ICD-10-CM | POA: Diagnosis present

## 2012-07-09 DIAGNOSIS — I10 Essential (primary) hypertension: Secondary | ICD-10-CM | POA: Diagnosis present

## 2012-07-09 DIAGNOSIS — Z993 Dependence on wheelchair: Secondary | ICD-10-CM

## 2012-07-09 DIAGNOSIS — N400 Enlarged prostate without lower urinary tract symptoms: Secondary | ICD-10-CM | POA: Diagnosis present

## 2012-07-09 DIAGNOSIS — E785 Hyperlipidemia, unspecified: Secondary | ICD-10-CM | POA: Diagnosis present

## 2012-07-09 DIAGNOSIS — K449 Diaphragmatic hernia without obstruction or gangrene: Secondary | ICD-10-CM | POA: Diagnosis present

## 2012-07-09 DIAGNOSIS — Z86718 Personal history of other venous thrombosis and embolism: Secondary | ICD-10-CM

## 2012-07-09 DIAGNOSIS — I252 Old myocardial infarction: Secondary | ICD-10-CM

## 2012-07-09 DIAGNOSIS — Z79899 Other long term (current) drug therapy: Secondary | ICD-10-CM

## 2012-07-09 LAB — CBC WITH DIFFERENTIAL/PLATELET
Basophils Relative: 0 % (ref 0–1)
Eosinophils Absolute: 0 10*3/uL (ref 0.0–0.7)
HCT: 36.3 % — ABNORMAL LOW (ref 39.0–52.0)
Hemoglobin: 12.1 g/dL — ABNORMAL LOW (ref 13.0–17.0)
MCH: 27.7 pg (ref 26.0–34.0)
MCHC: 33.3 g/dL (ref 30.0–36.0)
Monocytes Absolute: 0.8 10*3/uL (ref 0.1–1.0)
Monocytes Relative: 9 % (ref 3–12)
Neutrophils Relative %: 79 % — ABNORMAL HIGH (ref 43–77)

## 2012-07-09 LAB — COMPREHENSIVE METABOLIC PANEL
Albumin: 3.2 g/dL — ABNORMAL LOW (ref 3.5–5.2)
BUN: 31 mg/dL — ABNORMAL HIGH (ref 6–23)
Creatinine, Ser: 0.91 mg/dL (ref 0.50–1.35)
Total Protein: 6.3 g/dL (ref 6.0–8.3)

## 2012-07-09 LAB — TYPE AND SCREEN
ABO/RH(D): A POS
Antibody Screen: NEGATIVE

## 2012-07-09 MED ORDER — ONDANSETRON 4 MG PO TBDP
8.0000 mg | ORAL_TABLET | Freq: Once | ORAL | Status: AC
  Administered 2012-07-09: 8 mg via ORAL
  Filled 2012-07-09: qty 2

## 2012-07-09 NOTE — ED Notes (Signed)
Pt states that he started feeling sick yesterday around 13:00. Pt states that he was vomiting clear fluids at first but then he started vomited coffee colored emesis. Pt unable to eat or drink due to vomiting. Pt denies diarrhea. Pt states also having chills.

## 2012-07-10 ENCOUNTER — Inpatient Hospital Stay (HOSPITAL_COMMUNITY): Payer: Medicare Other

## 2012-07-10 ENCOUNTER — Telehealth: Payer: Self-pay | Admitting: Internal Medicine

## 2012-07-10 ENCOUNTER — Inpatient Hospital Stay (HOSPITAL_COMMUNITY)
Admission: EM | Admit: 2012-07-10 | Discharge: 2012-07-11 | DRG: 378 | Disposition: A | Discharge status: Home / Self Care | Payer: Medicare Other | Attending: Internal Medicine | Admitting: Internal Medicine

## 2012-07-10 ENCOUNTER — Encounter (HOSPITAL_COMMUNITY): Payer: Self-pay | Admitting: Emergency Medicine

## 2012-07-10 ENCOUNTER — Emergency Department (HOSPITAL_COMMUNITY): Payer: Medicare Other

## 2012-07-10 DIAGNOSIS — R112 Nausea with vomiting, unspecified: Secondary | ICD-10-CM | POA: Diagnosis present

## 2012-07-10 DIAGNOSIS — N390 Urinary tract infection, site not specified: Secondary | ICD-10-CM | POA: Diagnosis present

## 2012-07-10 DIAGNOSIS — Z86718 Personal history of other venous thrombosis and embolism: Secondary | ICD-10-CM

## 2012-07-10 DIAGNOSIS — R609 Edema, unspecified: Secondary | ICD-10-CM

## 2012-07-10 DIAGNOSIS — K922 Gastrointestinal hemorrhage, unspecified: Secondary | ICD-10-CM

## 2012-07-10 DIAGNOSIS — I2510 Atherosclerotic heart disease of native coronary artery without angina pectoris: Secondary | ICD-10-CM

## 2012-07-10 DIAGNOSIS — Z8719 Personal history of other diseases of the digestive system: Secondary | ICD-10-CM | POA: Diagnosis present

## 2012-07-10 HISTORY — DX: Atherosclerotic heart disease of native coronary artery without angina pectoris: I25.10

## 2012-07-10 HISTORY — DX: Cerebral infarction, unspecified: I63.9

## 2012-07-10 HISTORY — DX: Diaphragmatic hernia without obstruction or gangrene: K44.9

## 2012-07-10 LAB — CBC
Hemoglobin: 10.4 g/dL — ABNORMAL LOW (ref 13.0–17.0)
MCH: 27.8 pg (ref 26.0–34.0)
Platelets: 184 10*3/uL (ref 150–400)
RBC: 3.74 MIL/uL — ABNORMAL LOW (ref 4.22–5.81)
RDW: 14 % (ref 11.5–15.5)
WBC: 7.4 10*3/uL (ref 4.0–10.5)
WBC: 7 10*3/uL (ref 4.0–10.5)

## 2012-07-10 LAB — ABO/RH: ABO/RH(D): A POS

## 2012-07-10 LAB — COMPREHENSIVE METABOLIC PANEL
Alkaline Phosphatase: 49 U/L (ref 39–117)
BUN: 28 mg/dL — ABNORMAL HIGH (ref 6–23)
Chloride: 104 mEq/L (ref 96–112)
GFR calc Af Amer: >90 mL/min (ref >90)
GFR calc non Af Amer: 84 mL/min — ABNORMAL LOW (ref >90)
Glucose, Bld: 82 mg/dL (ref 70–99)
Potassium: 3.4 mEq/L — ABNORMAL LOW (ref 3.5–5.1)
Total Bilirubin: 0.2 mg/dL — ABNORMAL LOW (ref 0.3–1.2)
Total Protein: 5.1 g/dL — ABNORMAL LOW (ref 6.0–8.3)

## 2012-07-10 LAB — URINE MICROSCOPIC-ADD ON

## 2012-07-10 LAB — URINALYSIS, ROUTINE W REFLEX MICROSCOPIC
Ketones, ur: NEGATIVE mg/dL
Nitrite: NEGATIVE
Urobilinogen, UA: 1 mg/dL (ref 0.0–1.0)

## 2012-07-10 LAB — OCCULT BLOOD, POC DEVICE: Fecal Occult Bld: NEGATIVE

## 2012-07-10 MED ORDER — SODIUM CHLORIDE 0.9 % IV SOLN
INTRAVENOUS | Status: AC
  Administered 2012-07-10: 06:00:00 via INTRAVENOUS

## 2012-07-10 MED ORDER — PNEUMOCOCCAL VAC POLYVALENT 25 MCG/0.5ML IJ INJ
0.5000 mL | INJECTION | INTRAMUSCULAR | Status: AC
  Administered 2012-07-11: 0.5 mL via INTRAMUSCULAR
  Filled 2012-07-10 (×3): qty 0.5

## 2012-07-10 MED ORDER — POTASSIUM CHLORIDE IN NACL 20-0.9 MEQ/L-% IV SOLN
INTRAVENOUS | Status: AC
  Administered 2012-07-10 (×2): via INTRAVENOUS
  Filled 2012-07-10 (×4): qty 1000

## 2012-07-10 MED ORDER — PANTOPRAZOLE SODIUM 40 MG IV SOLR: 40.0000 mg | INTRAVENOUS | Status: DC

## 2012-07-10 MED ORDER — SODIUM CHLORIDE 0.9 % IV BOLUS (SEPSIS)
1000.0000 mL | Freq: Once | INTRAVENOUS | Status: AC
  Administered 2012-07-10: 1000 mL via INTRAVENOUS

## 2012-07-10 MED ORDER — METOPROLOL TARTRATE 12.5 MG HALF TABLET
12.5000 mg | ORAL_TABLET | Freq: Two times a day (BID) | ORAL | Status: DC
Start: 2012-07-10 — End: 2012-07-11
  Administered 2012-07-10 – 2012-07-11 (×3): 12.5 mg via ORAL
  Filled 2012-07-10 (×5): qty 1

## 2012-07-10 MED ORDER — POTASSIUM CHLORIDE 10 MEQ/100ML IV SOLN
10.0000 meq | INTRAVENOUS | Status: AC
  Administered 2012-07-10: 10 meq via INTRAVENOUS
  Filled 2012-07-10 (×2): qty 100

## 2012-07-10 MED ORDER — INFLUENZA VIRUS VACC SPLIT PF IM SUSP
0.5000 mL | INTRAMUSCULAR | Status: AC
  Administered 2012-07-11: 0.5 mL via INTRAMUSCULAR
  Filled 2012-07-10 (×2): qty 0.5

## 2012-07-10 MED ORDER — SODIUM CHLORIDE 0.9 % IV SOLN
8.0000 mg/h | INTRAVENOUS | Status: DC
  Administered 2012-07-10: 8 mg/h via INTRAVENOUS
  Filled 2012-07-10 (×2): qty 80

## 2012-07-10 MED ORDER — DEXTROSE 5 % IV SOLN: 1.0000 g | INTRAVENOUS | Status: DC

## 2012-07-10 MED ORDER — PANTOPRAZOLE SODIUM 40 MG IV SOLR
40.0000 mg | Freq: Once | INTRAVENOUS | Status: AC
  Administered 2012-07-10: 40 mg via INTRAVENOUS
  Filled 2012-07-10: qty 40

## 2012-07-10 MED ORDER — DEXTROSE 5 % IV SOLN
1.0000 g | Freq: Once | INTRAVENOUS | Status: AC
  Administered 2012-07-10: 1 g via INTRAVENOUS
  Filled 2012-07-10: qty 10

## 2012-07-10 MED ORDER — PANTOPRAZOLE SODIUM 40 MG IV SOLR
40.0000 mg | Freq: Two times a day (BID) | INTRAVENOUS | Status: DC
  Administered 2012-07-10 – 2012-07-11 (×2): 40 mg via INTRAVENOUS
  Filled 2012-07-10 (×4): qty 40

## 2012-07-10 MED ORDER — PIPERACILLIN-TAZOBACTAM 3.375 G IVPB
3.3750 g | Freq: Three times a day (TID) | INTRAVENOUS | Status: DC
  Administered 2012-07-10 – 2012-07-11 (×5): 3.375 g via INTRAVENOUS
  Filled 2012-07-10 (×7): qty 50

## 2012-07-10 MED ORDER — ONDANSETRON HCL 4 MG PO TABS: 4.0000 mg | ORAL_TABLET | Freq: Four times a day (QID) | ORAL | Status: DC | PRN

## 2012-07-10 MED ORDER — SIMVASTATIN 40 MG PO TABS
40.0000 mg | ORAL_TABLET | Freq: Every evening | ORAL | Status: DC
  Administered 2012-07-10: 40 mg via ORAL
  Filled 2012-07-10 (×3): qty 1

## 2012-07-10 MED ORDER — ACETAMINOPHEN 650 MG RE SUPP: 650.0000 mg | Freq: Four times a day (QID) | RECTAL | Status: DC | PRN

## 2012-07-10 MED ORDER — SODIUM CHLORIDE 0.9 % IJ SOLN
3.0000 mL | Freq: Two times a day (BID) | INTRAMUSCULAR | Status: DC
  Administered 2012-07-10 (×2): 3 mL via INTRAVENOUS

## 2012-07-10 MED ORDER — ACETAMINOPHEN 325 MG PO TABS: 650.0000 mg | ORAL_TABLET | Freq: Four times a day (QID) | ORAL | Status: DC | PRN

## 2012-07-10 MED ORDER — ONDANSETRON HCL 4 MG/2ML IJ SOLN: 4.0000 mg | Freq: Four times a day (QID) | INTRAMUSCULAR | Status: DC | PRN

## 2012-07-10 MED ORDER — PANTOPRAZOLE SODIUM 40 MG IV SOLR: 40.0000 mg | Freq: Two times a day (BID) | INTRAVENOUS | Status: DC

## 2012-07-10 NOTE — ED Notes (Signed)
[place] on hold for now by [place] tech

## 2012-07-10 NOTE — Telephone Encounter (Signed)
[facility] Triage Call Report Triage Record Num: [id number] Operator: Tomasita Crumble Patient Name: [facility] Call Date & Time: 07/09/2012 8:02:30AM Patient Phone: [phone] PCP: Darryll Capers Patient Gender: Male PCP Fax : [phone] Patient DOB: January 25, 1941 Practice Name: [facility] Reason for Call: Caller: [name]/Patient; PCP: Darryll Capers (Adults only); CB#: [phone]; Call regarding Vomiting, chills and fever; Onset 07/08/12 multiple episodes and resumed @ 0130 on 07/09/12. Febrile/subjective. Caller reports at least 10 episodes of vomiting, unable to keep liquids down more than 15 minutes. Denies sx of UTI (self caths and has frequent UTI's). Emergent symptoms ruled out. Call Provider Immediately per Nausea or Vomiting protocol; downgraded to home care and used standing Rx for Zofran/ Ondansetron 8 mg po q 8 hours, disp 6, no refills. Rx. called to [facility]; Dr. Milinda Cave on call. Protocol(s) Used: Nausea or Vomiting Recommended Outcome per Protocol: Call Provider Immediately Reason for Outcome: Unable to take or keep down prescription medication (heart, respiratory, diabetes, thyroid, antibiotics, birth control pills, corticosteroids) because of nausea/vomiting Care Advice: ~ SYMPTOM / CONDITION MANAGEMENT Discontinue nonprescribed medications and complementary/alternative medications. Continue prescribed medication(s) at ordered dosage/frequency until discussed with provider. ~ Vomiting Care Advice: - Do not eat solid foods until vomiting subsides. - Begin taking fluids by sucking on ice chips or popsicles or taking sips of cool clear, nonprescription oral rehydration solution). - Gradually drink larger amounts of these fluids so that you are drinking six to eight 8 oz. (.2 liter) of fluids a day. - Keep activity to a minimum. - After vomiting subsides, eat smaller, more frequent meals of easily digested foods such as crackers, toast, bananas, rice, cooked  cereal, applesauce, broth, baked or mashed potatoes, chicken or [place] without skin. Eat slowly. - Take fluids 30 minutes before or 60 minutes after meals. - Avoid high fat, highly seasoned, high fiber or high sugar content foods. - Avoid extremely hot or cold foods. - Do not take pain medication (such as aspirin, NSAIDs) while nauseated or vomiting. - Consult your provider for advice regarding continuing prescription medication. - Rest as much as possible in a sitting or in a propped lying position. Do not lie flat for at least 2 hours after eating. ~ 07/09/2012 8:31:07AM Page 1 of 1 [email or web address]

## 2012-07-10 NOTE — ED Notes (Signed)
Patient transported to X-ray 

## 2012-07-10 NOTE — Progress Notes (Signed)
Joseph Hayes [id number] Admission Data: 07/10/2012 4:52 PM Attending Provider: Alba Cory, MD  WUJ:[id number],WGNF Ramon Dredge, MD Consults/ Treatment Team: Treatment Team:  Martina Sinner, MD  Joseph Hayes is a 72 y.o. male patient admitted from ED awake, alert  & orientated  X 3,  Full Code, VSS - Blood pressure 123/66, pulse 64, temperature 98.5 F (36.9 C), temperature source Oral, resp. rate 20, SpO2 94.00%.,     , no c/o shortness of breath, no c/o chest pain, no distress noted. Tele # [id number] placed and pt is currently running:normal sinus rhythm.   IV site WDL:  hand right, condition patent and no redness and antecubital left, condition patent and no redness with a transparent dsg that's clean dry and intact.  Allergies:  No Known Allergies   Past Medical History  Diagnosis Date  . Hyperlipidemia   . Hypertension   . BPH (benign prostatic hypertrophy)   . H/O: CVA (cardiovascular accident)   . Acute DVT (deep venous thrombosis)   . MI, old   . SHINGLES 04/07/2009  . Coronary artery disease   . Stroke 2006  . Hiatal hernia     wheelchair bound stroke residual    History:  obtained from the patient.   Pt orientation to unit, room and routine. Information packet given to patient/family and safety video watched.  Admission INP armband ID verified with patient/family, and in place. SR up x 2, fall risk assessment complete with Patient and family verbalizing understanding of risks associated with falls. Pt verbalizes an understanding of how to use the call bell and to call for help. Pt is bed bound due to CVA in 2006.  Skin, clean-dry- intact without evidence of bruising, or skin tears.   No evidence of skin break down noted on exam.     Will cont to monitor and assist as needed.  Cindra Eves, RN 07/10/2012 4:52 PM

## 2012-07-10 NOTE — ED Provider Notes (Signed)
History     CSN: [id number]  Arrival date & time 07/09/12  2138   First MD Initiated Contact with Patient 07/10/12 0053      Chief Complaint  Patient presents with  . Hematemesis    (Consider location/radiation/quality/duration/timing/severity/associated sxs/prior treatment) HPI Patient has had approximately 3 episodes of vomiting since afternoon of 07/08/2012. 3 episodes since yesterday afternoon. Emesis resembled coffee grounds. Last bowel movement yesterday, normal he admits to chills and subjective fever. He denies chest pain headache cough abdominal pain or other symptoms. He is presently asymptomatic. His PCP found in Zofran, however he continued to vomit. Past Medical History  Diagnosis Date  . Hyperlipidemia   . Hypertension   . BPH (benign prostatic hypertrophy)   . H/O: CVA (cardiovascular accident)   . Acute DVT (deep venous thrombosis)   . MI, old   . SHINGLES 04/07/2009  . Hiatal hernia     spinal stroke, paralyzed from nipples down  History reviewed. No pertinent past surgical history.  Family History  Problem Relation Age of Onset  . Hypertension    . Alcohol abuse    . Hyperlipidemia    . Heart disease Father     History  Substance Use Topics  . Smoking status: Former Smoker    Quit date: 05/03/1974  . Smokeless tobacco: Not on file  . Alcohol Use: No      Review of Systems  Constitutional: Negative.   HENT: Negative.   Respiratory: Negative.   Cardiovascular: Negative.   Gastrointestinal: Positive for vomiting.  Musculoskeletal: Negative.   Skin: Negative.   Neurological: Negative.        Paraplegic  Psychiatric/Behavioral: Negative.   All other systems reviewed and are negative.    Allergies  Review of patient's allergies indicates no known allergies.  Home Medications   Current Outpatient Rx  Name  Route  Sig  Dispense  Refill  . aspirin 325 MG tablet   Oral   Take 325 mg by mouth daily.           . ciprofloxacin (CIPRO)  500 MG tablet      TAKE 1 TABLET TWICE A DAY AS NEEDED   20 tablet   2   . CVS VITAMIN B-12 500 MCG tablet      PLACE 1 TABLET UNDER TONGUE DAILY   100 tablet   3   . Cyanocobalamin (B-12 DOTS) 500 MCG SUBL   Sublingual   Place 1 tablet (500 mcg total) under the tongue daily.   30 tablet   6   . folic acid (FOLVITE) 1 MG tablet   Oral   Take 1 tablet (1 mg total) by mouth daily.   90 tablet   3   . meclizine (ANTIVERT) 25 MG tablet   Oral   Take 25 mg by mouth 3 (three) times daily as needed.           . metoprolol tartrate (LOPRESSOR) 25 MG tablet      TAKE HALF TABLET BY MOUTH TWO TIMES A DAY   90 tablet   2   . mupirocin ointment (BACTROBAN) 2 %      APPLY TWO TIMES A DAY & AS NEEDED TO AFFECTED AREA   22 g   0   . omeprazole (PRILOSEC) 20 MG capsule   Oral   Take 20 mg by mouth daily.           . simvastatin (ZOCOR) 40 MG tablet  TAKE 1 TABLET EVERY DAY BY MOUTH   90 tablet   2   . VIAGRA 100 MG tablet      ONE BY MOUTH DAILY AS NEEDED   5 tablet   9     BP 142/86  Pulse 82  Temp(Src) 98.2 F (36.8 C) (Oral)  Resp 16  SpO2 97%  Physical Exam  Nursing note and vitals reviewed. Constitutional: He appears well-developed and well-nourished.  HENT:  Head: Normocephalic and atraumatic.  Eyes: Conjunctivae are normal. Pupils are equal, round, and reactive to light.  Neck: Neck supple. No tracheal deviation present. No thyromegaly present.  Cardiovascular: Normal rate and regular rhythm.   No murmur heard. Pulmonary/Chest: Effort normal and breath sounds normal.  Abdominal: Soft. Bowel sounds are normal. He exhibits no distension. There is no tenderness.  Genitourinary: Rectum normal.  Brown liquid stool no gross blood  Musculoskeletal: Normal range of motion. He exhibits no edema and no tenderness.  Atrophy of all 4 extremities,  Neurological: He is alert. A cranial nerve deficit is present.  Paraplegic  Skin: Skin is warm and  dry. No rash noted.  Psychiatric: He has a normal mood and affect.    ED Course  Procedures (including critical care time)  Labs Reviewed  CBC WITH DIFFERENTIAL - Abnormal; Notable for the following:    Hemoglobin 12.1 (*)    HCT 36.3 (*)    Neutrophils Relative 79 (*)    All other components within normal limits  COMPREHENSIVE METABOLIC PANEL - Abnormal; Notable for the following:    Potassium 3.4 (*)    Glucose, Bld 121 (*)    BUN 31 (*)    Albumin 3.2 (*)    AST 54 (*)    GFR calc non Af Amer 84 (*)    All other components within normal limits  TYPE AND SCREEN  ABO/RH   No results found. Results for orders placed during the hospital encounter of 07/10/12  CBC WITH DIFFERENTIAL      Result Value Range   WBC 8.8  4.0 - 10.5 K/uL   RBC 4.37  4.22 - 5.81 MIL/uL   Hemoglobin 12.1 (*) 13.0 - 17.0 g/dL   HCT 16.1 (*) 09.6 - 04.5 %   MCV 83.1  78.0 - 100.0 fL   MCH 27.7  26.0 - 34.0 pg   MCHC 33.3  30.0 - 36.0 g/dL   RDW 40.9  81.1 - 91.4 %   Platelets 202  150 - 400 K/uL   Neutrophils Relative 79 (*) 43 - 77 %   Neutro Abs 6.9  1.7 - 7.7 K/uL   Lymphocytes Relative 12  12 - 46 %   Lymphs Abs 1.1  0.7 - 4.0 K/uL   Monocytes Relative 9  3 - 12 %   Monocytes Absolute 0.8  0.1 - 1.0 K/uL   Eosinophils Relative 0  0 - 5 %   Eosinophils Absolute 0.0  0.0 - 0.7 K/uL   Basophils Relative 0  0 - 1 %   Basophils Absolute 0.0  0.0 - 0.1 K/uL  COMPREHENSIVE METABOLIC PANEL      Result Value Range   Sodium 136  135 - 145 mEq/L   Potassium 3.4 (*) 3.5 - 5.1 mEq/L   Chloride 99  96 - 112 mEq/L   CO2 26  19 - 32 mEq/L   Glucose, Bld 121 (*) 70 - 99 mg/dL   BUN 31 (*) 6 - 23 mg/dL   Creatinine,  Ser 0.91  0.50 - 1.35 mg/dL   Calcium 9.1  8.4 - 16.1 mg/dL   Total Protein 6.3  6.0 - 8.3 g/dL   Albumin 3.2 (*) 3.5 - 5.2 g/dL   AST 54 (*) 0 - 37 U/L   ALT 17  0 - 53 U/L   Alkaline Phosphatase 64  39 - 117 U/L   Total Bilirubin 0.5  0.3 - 1.2 mg/dL   GFR calc non Af Amer 84 (*)  >90 mL/min   GFR calc Af Amer >90  >90 mL/min  URINALYSIS, ROUTINE W REFLEX MICROSCOPIC      Result Value Range   Color, Urine AMBER (*) YELLOW   APPearance CLOUDY (*) CLEAR   Specific Gravity, Urine 1.036 (*) 1.005 - 1.030   pH 6.0  5.0 - 8.0   Glucose, UA NEGATIVE  NEGATIVE mg/dL   Hgb urine dipstick MODERATE (*) NEGATIVE   Bilirubin Urine SMALL (*) NEGATIVE   Ketones, ur NEGATIVE  NEGATIVE mg/dL   Protein, ur 30 (*) NEGATIVE mg/dL   Urobilinogen, UA 1.0  0.0 - 1.0 mg/dL   Nitrite NEGATIVE  NEGATIVE   Leukocytes, UA SMALL (*) NEGATIVE  URINE MICROSCOPIC-ADD ON      Result Value Range   Squamous Epithelial / LPF FEW (*) RARE   WBC, UA 11-20  <3 WBC/hpf   RBC / HPF 3-6  <3 RBC/hpf   Bacteria, UA MANY (*) RARE   Casts HYALINE CASTS (*) NEGATIVE   Urine-Other MUCOUS PRESENT    OCCULT BLOOD, POC DEVICE      Result Value Range   Fecal Occult Bld NEGATIVE  NEGATIVE  POCT GASTRIC OCCULT BLOOD (1-CARD TO LAB)      Result Value Range   pH, Gastric NOT DONE     Occult Blood, Gastric POSITIVE (*) NEGATIVE  TYPE AND SCREEN      Result Value Range   ABO/RH(D) A POS     Antibody Screen NEG     Sample Expiration 07/12/2012     Dg Abd Acute W/chest  07/10/2012  *RADIOLOGY REPORT*  Clinical Data: Vomiting  ACUTE ABDOMEN SERIES (ABDOMEN 2 VIEW & CHEST 1 VIEW)  Comparison: 10/23/2004 CT  Findings: The bowel gas pattern is non-obstructive. Calcific densities projecting over the right renal shadow, in keeping with renal stones. Nonspecific 1 cm rounded calcific density just left of the L2 vertebral body.  Otherwise, or outlines are normal where seen. No acute or aggressive osseous abnormality identified.  IMPRESSION: Nonobstructive bowel gas pattern.  Right renal stones.   Original Report Authenticated By: Jearld Lesch, M.D.      No diagnosis found.  Spoke with Dr.Kakrakandy  MDM  Plan admit, telemetry, telemetry proton pump inhibitor IV fluids, urine culture IV antibiotics Diagnosis  #1 upper GI bleed #2 urinary tract infection        Doug Sou, MD 07/10/12 0345

## 2012-07-10 NOTE — ED Notes (Signed)
Patient transported to Ultrasound 

## 2012-07-10 NOTE — H&P (Signed)
[facility] History and Physical  Del Overfelt Mccook ZOX:[id number] DOB: 05/11/1940 DOA: 07/10/2012  Referring physician: Dr.Jacubowictz. PCP: [name], MD  Specialists: None.  Chief Complaint: Nausea vomiting.  HPI: Joseph Hayes is a 72 y.o. male with history of CAD status post stenting, CVA with paraplegia, hypertension and hyperlipidemia has been experiencing nausea vomiting over the last 2-3 days. Denies any abdominal pain or diarrhea. Last 2-3 episodes of vomiting he had coffee-ground color vomitus. Patient also has been experiencing some subjective feeling of fever and chills. In the ER stool for occult blood was negative but gastric occult was positive. Initial hemoglobin is around 12 and repeat as been ordered. Patient will be admitted for further management. In addition patient has been experiencing some fever and chills and UA is compatible with UTI. Patient has been started by [place] for possible UTI and patient's acute abdominal series shows nonobstructive gas pattern but does show urinary stones for which I have ordered renal sonogram to rule out obstruction. Patient presently is hemodynamically stable. Denies any chest pain or shortness of breath.  Review of Systems: As presented in the history of presenting illness nothing else significant.  Past Medical History  Diagnosis Date  . Hyperlipidemia   . Hypertension   . BPH (benign prostatic hypertrophy)   . H/O: CVA (cardiovascular accident)   . Acute DVT (deep venous thrombosis)   . MI, old   . SHINGLES 04/07/2009  . Hiatal hernia    Past Surgical History  Procedure Laterality Date  . Cardiac catheterization    . Coronary angioplasty     Social History:  reports that he quit smoking about 38 years ago. He does not have any smokeless tobacco history on file. He reports that he does not drink alcohol or use illicit drugs. Lives at home with his wife. where does patient live--home, ALF, SNF? and with whom if at  home? Cannot do ADLs. Can patient participate in ADLs?  No Known Allergies  Family History  Problem Relation Age of Onset  . Hypertension    . Alcohol abuse    . Hyperlipidemia    . Heart disease Father       Prior to Admission medications   Medication Sig Start Date End Date Taking? Authorizing Provider  aspirin 325 MG tablet Take 325 mg by mouth daily.     Yes Historical Provider, MD  metoprolol tartrate (LOPRESSOR) 25 MG tablet Take 12.5 mg by mouth 2 (two) times daily.   Yes Historical Provider, MD  omeprazole (PRILOSEC) 20 MG capsule Take 20 mg by mouth daily.     Yes Historical Provider, MD  simvastatin (ZOCOR) 40 MG tablet Take 40 mg by mouth every evening.   Yes Historical Provider, MD  VIAGRA 100 MG tablet ONE BY MOUTH DAILY AS NEEDED 11/06/10   [name], MD   Physical Exam: Filed Vitals:   07/10/12 0300 07/10/12 0329 07/10/12 0330 07/10/12 0400  BP: 128/61 128/61 116/86 116/63  Pulse: 76 84 83 79  Temp:      TempSrc:      Resp:  16    SpO2: 96% 97% 97% 96%     General:  Well-developed and nourished.  Eyes: Anicteric no pallor.  ENT: No discharge from the ears eyes nose and mouth.  Neck: No mass felt.  Cardiovascular: S1-S2 heard.  Respiratory: No rhonchi or crepitations.  Abdomen: Soft nontender bowel sounds present.  Skin: No rash.  Musculoskeletal: Bilateral lower extremity edema.  Psychiatric: Appears  normal.  Neurologic: Alert awake oriented to time place and person. Paraplegic.  Labs on Admission:  Basic Metabolic Panel:  Recent Labs Lab 07/09/12 2212  NA 136  K 3.4*  CL 99  CO2 26  GLUCOSE 121*  BUN 31*  CREATININE 0.91  CALCIUM 9.1   Liver Function Tests:  Recent Labs Lab 07/09/12 2212  AST 54*  ALT 17  ALKPHOS 64  BILITOT 0.5  PROT 6.3  ALBUMIN 3.2*   No results found for this basename: LIPASE, AMYLASE,  in the last 168 hours No results found for this basename: AMMONIA,  in the last 168 hours CBC:  Recent  Labs Lab 07/09/12 2212 07/10/12 0431  WBC 8.8 7.4  NEUTROABS 6.9  --   HGB 12.1* 10.4*  HCT 36.3* 30.9*  MCV 83.1 82.6  PLT 202 195   Cardiac Enzymes: No results found for this basename: CKTOTAL, CKMB, CKMBINDEX, TROPONINI,  in the last 168 hours  BNP (last 3 results) No results found for this basename: PROBNP,  in the last 8760 hours CBG: No results found for this basename: GLUCAP,  in the last 168 hours  Radiological Exams on Admission: Dg Abd Acute W/chest  07/10/2012  *RADIOLOGY REPORT*  Clinical Data: Vomiting  ACUTE ABDOMEN SERIES (ABDOMEN 2 VIEW & CHEST 1 VIEW)  Comparison: 10/23/2004 CT  Findings: The bowel gas pattern is non-obstructive. Calcific densities projecting over the right renal shadow, in keeping with renal stones. Nonspecific 1 cm rounded calcific density just left of the L2 vertebral body.  Otherwise, or outlines are normal where seen. No acute or aggressive osseous abnormality identified.  IMPRESSION: Nonobstructive bowel gas pattern.  Right renal stones.   Original Report Authenticated By: Jearld Lesch, M.D.      Assessment/Plan Principal Problem:   Nausea & vomiting Active Problems:   CAD   UTI (lower urinary tract infection)   GI bleed   1. Nausea vomiting with GI bleed - a repeat hemoglobin has been ordered with type and screen. Closely follow in telemetry at this time patient is presently hemodynamically stable. Patient has been placed on protonix IV for GI bleed. Transfuse if hemoglobin falls below 8. Keep patient n.p.o. in anticipation of possible procedure. Not sure what exactly causes patient's vomiting could be from UTI x-rays don't show any obstructive pattern and abdomen is appearing benign. 2. UTI in a patient who does self-catheterization - urine cultures have been sent. Since patient's acute abdominal series shows renal stone I have ordered a renal sonogram to rule out a obstruction/hydronephrosis. Continue antibiotics. 3. CAD status post  stenting - denies any chest pain and in view of patient's possible GI bleed aspirin has been held for now. 4. History of CVA and paraplegia 5. History of hyperlipidemia - resume statins once patient can take orally. 6. Bilateral lower extremity edema - patient's charts state that he has had previous DVT but patient does not recall. At this time I have ordered duplex to rule out DVT.  Code Status: Full code. Family Communication: Patient's wife at bedside.  Disposition Plan: Admit to inpatient.   KAKRAKANDY,ARSHAD N. [facility] Pager [phone].  If 7PM-7AM, please contact night-coverage [email or web address] Password [facility] 07/10/2012, 4:52 AM

## 2012-07-10 NOTE — Consult Note (Signed)
[facility] Consult: 12:18 PM 07/10/2012   Referring Provider: Dr Lauraine Rinne  Primary Care Physician:  [name], MD Primary Gastroenterologist:   None  Reason for Consultation:  Coffee ground emesis  HPI: Joseph Hayes is a 72 y.o. male.  He is paraplegic secondary to C7 cord ischemia since 2006 with hx of DVT,MI with coronary angioplasty 1999. Hiatal hernia by 2006 CT scan.  No anticoagulants, just 325 mg ASA daily.  Saturday AM developed n/v of clear material, malaise, fevers, rigors.  Many episodes of clear emesis until sxs ceased at 11 PM Saturday.  Vomitting, chill, malaise recurred 11 AM Sunday after vomiting a few times, threw up CG material.  Urine on self cath (does this 4 x daily)  Never was cloudy or smelly as is usual for past UTIs.  However urine very dark on Sunday.  Never had an EGD.  Takes Prilosec OTC twice daily and this controls his reflux sxs of heartburn and regurgitation.  Denies dysphagia, epigastric pain.  He is able to experience abdominal pain despite the Paraplegia.  Never had an EGD In ED his urine reveal bacteruria, WBCs.  Started on Rocephin.   Blood reveals normal WBCs.  Hgb initially 12.1, now steady at 10.4.  Dr Toniann Fail has started Protonix drip. He is NPO.  Nausea improved with Zofran.     Past Medical History  Diagnosis Date  . Hyperlipidemia   . Hypertension   . BPH (benign prostatic hypertrophy)   . H/O: CVA (cardiovascular accident)   . Acute DVT (deep venous thrombosis)   . MI, old   . SHINGLES 04/07/2009  . Hiatal hernia     Past Surgical History  Procedure Laterality Date  . Cardiac catheterization    . Coronary angioplasty      Prior to Admission medications   Medication Sig Start Date End Date Taking? Authorizing Provider  aspirin 325 MG tablet Take 325 mg by mouth daily.     Yes Historical Provider, MD  metoprolol tartrate (LOPRESSOR) 25 MG tablet Take 12.5 mg by mouth 2  (two) times daily.   Yes Historical Provider, MD  omeprazole (PRILOSEC) 20 MG capsule Take 20 mg by mouth daily.     Yes Historical Provider, MD  simvastatin (ZOCOR) 40 MG tablet Take 40 mg by mouth every evening.   Yes Historical Provider, MD  VIAGRA 100 MG tablet ONE BY MOUTH DAILY AS NEEDED 11/06/10   [name], MD  Prilosec  OTC  20 mg             BID                              Scheduled Meds: . metoprolol tartrate  12.5 mg Oral BID  . simvastatin  40 mg Oral QPM  . sodium chloride  3 mL Intravenous Q12H   Infusions: . sodium chloride 125 mL/hr at 07/10/12 0629  . 0.9 % NaCl with KCl 20 mEq / L 75 mL/hr at 07/10/12 0842  . pantoprozole (PROTONIX) infusion 8 mg/hr (07/10/12 0554)  . piperacillin-tazobactam (ZOSYN)  IV 3.375 g (07/10/12 0924)  . potassium chloride 10 mEq (07/10/12 1119)   PRN Meds: acetaminophen, acetaminophen, ondansetron (ZOFRAN) IV, ondansetron   Allergies as of 07/09/2012  . (No Known Allergies)    Family History  Problem Relation Age of Onset  . Hypertension    . Alcohol abuse    . Hyperlipidemia    .  Heart disease Father     History   Social History  . Marital Status: Married    Spouse Name: N/A    Number of Children: N/A  . Years of Education: N/A   Occupational History  . self employed    Social History Main Topics  . Smoking status: Former Smoker    Quit date: 05/03/1974  . Smokeless tobacco: Not on file  . Alcohol Use: No  . Drug Use: No  . Sexually Active: Yes   Other Topics Concern  . Not on file   Social History Narrative  . No narrative on file    REVIEW OF SYSTEMS: Constitutional:  Weight down 10 or so # due to dietary efforts.   ENT:  No nose blleds Pulm:  No cough or dyspness CV:  No palpitations GU:  Per HPI GI:  Per HPI Heme:  No previous anemia.    Transfusions:  None ever Neuro:  No haeadache, no blurry vision or loss or vision.  Only partial UE strength left> right but is able to self transfer to manual  wheelchair wich he can push himslf.  Derm:  No sores, no rash Endocrine:  No excessive thirst. Immunization:  Missed this season's flu shot:  Office had run out Travel:  none   PHYSICAL EXAM: Vital signs in last 24 hours: Temp:  [98.2 F (36.8 C)-99.6 F (37.6 C)] 98.7 F (37.1 C) (03/10 1023) Pulse Rate:  [66-84] 70 (03/10 1023) Resp:  [16-19] 19 (03/10 1023) BP: (111-142)/(60-89) 131/61 mmHg (03/10 1023) SpO2:  [9 %-98 %] 96 % (03/10 1023)  General: pleasant, slightly diaphoretic but well appearing WM.Marland Kitchen Does not look 71. Head:  Normocephalic, no signs of trauma  Eyes:  No icterus or conj pallor.  EOMI Ears: Not HOH Nose:  No bleeding or discharge Mouth:  Clear, moist and pink oral MM.  Teeth fair but many are missing. Neck:  No JVD or masses Lungs:  Clear B.  No resp distress or cough Heart: RRR.  No MRG Abdomen:  Soft, protuberant, umbilical hernia present.  BS active.  No HSM or mass, no bruits.   Rectal: deferred   Musc/Skeltl: generalized atrophy of upper and lower limbs.  Extremities:  Edema in lower legs  Neurologic:  Pleasant, fully oriented Skin:  No rash or angiomata. No sores Tattoos:  none Nodes:  No inguinal adenopathy GU:  No scrotal edema   Psych:  Pleasant, in good spirits, relaxed.   Intake/Output from previous day: 03/09 0701 - 03/10 0700 In: -  Out: 150 [Urine:150] Intake/Output this shift:    LAB RESULTS:  Recent Labs  07/09/12 2212 07/10/12 0431 07/10/12 0720  WBC 8.8 7.4 7.0  HGB 12.1* 10.4* 10.4*  HCT 36.3* 30.9* 30.6*  PLT 202 195 184   BMET Lab Results  Component Value Date   NA 138 07/10/2012   NA 136 07/09/2012   NA 137 05/29/2012   K 3.4* 07/10/2012   K 3.4* 07/09/2012   K 4.4 05/29/2012   CL 104 07/10/2012   CL 99 07/09/2012   CL 102 05/29/2012   CO2 27 07/10/2012   CO2 26 07/09/2012   CO2 27 05/29/2012   GLUCOSE 82 07/10/2012   GLUCOSE 121* 07/09/2012   GLUCOSE 86 05/29/2012   BUN 28* 07/10/2012   BUN 31* 07/09/2012   BUN 19  05/29/2012   CREATININE 0.91 07/10/2012   CREATININE 0.91 07/09/2012   CREATININE 1.0 05/29/2012   CALCIUM 8.1* 07/10/2012  CALCIUM 9.1 07/09/2012   CALCIUM 9.3 05/29/2012   LFT  Recent Labs  07/09/12 2212 07/10/12 0720  PROT 6.3 5.1*  ALBUMIN 3.2* 2.6*  AST 54* 55*  ALT 17 17  ALKPHOS 64 49  BILITOT 0.5 0.2*   PT/INR No results found for this basename: INR, PROTIME   Urine:  Ref. Range 07/10/2012 02:06  Color, Urine Latest Range: YELLOW  AMBER (A)  APPearance Latest Range: CLEAR  CLOUDY (A)  Specific Gravity, Urine Latest Range: 1.005-1.030  1.036 (H)  pH Latest Range: 5.0-8.0  6.0  Glucose Latest Range: NEGATIVE mg/dL NEGATIVE  Bilirubin Urine Latest Range: NEGATIVE  SMALL (A)  Ketones, ur Latest Range: NEGATIVE mg/dL NEGATIVE  Protein Latest Range: NEGATIVE mg/dL 30 (A)  Urobilinogen, UA Latest Range: 0.0-1.0 mg/dL 1.0  Nitrite Latest Range: NEGATIVE  NEGATIVE  Leukocytes, UA Latest Range: NEGATIVE  SMALL (A)  Hgb urine dipstick Latest Range: NEGATIVE  MODERATE (A)  Urine-Other No range found MUCOUS PRESENT  WBC, UA Latest Range: <3 WBC/hpf 11-20  RBC / HPF Latest Range: <3 RBC/hpf 3-6  Squamous Epithelial / LPF Latest Range: RARE  FEW (A)  Bacteria, UA Latest Range: RARE  MANY (A)  Casts Latest Range: NEGATIVE  HYALINE CASTS (A)   RADIOLOGY STUDIES: US Renal 07/10/2012    IMPRESSION:  1.  No hydronephrosis. 2.  Probable right mid calyceal diverticulum with calculus as noted above.  Small cysts on the left.   Original Report Authenticated By: Dwyane Dee, M.D.    Dg Abd Acute W/chest  07/10/2012  *RADIOLOGY REPORT*  Clinical Data: Vomiting  ACUTE ABDOMEN SERIES (ABDOMEN 2 VIEW & CHEST 1 VIEW)  Comparison: 10/23/2004 CT  Findings: The bowel gas pattern is non-obstructive. Calcific densities projecting over the right renal shadow, in keeping with renal stones. Nonspecific 1 cm rounded calcific density just left of the L2 vertebral body.  Otherwise, or outlines are normal where  seen. No acute or aggressive osseous abnormality identified.  IMPRESSION: Nonobstructive bowel gas pattern.  Right renal stones.   Original Report Authenticated By: Jearld Lesch, M.D.     ENDOSCOPIC STUDIES: None ever.   IMPRESSION: *  CG emesis occurring after many hours of non cg emesis.  Rule out gastric or esophageal irritation from vomiting.  Suspect vomiting is due to the UTI. Could have caused a small MW tear as well.  HH by chest CT in 2006.  sxs well controlled with BID PPI at home.  *  UTI.  Recurrent.   *  Paraplegia with nipple down loss of sensation, partial strength in arms.  Cause was cord ischemia in 2006   PLAN: *  Per Dr Leone Payor.  EGD is not urgent if pursued.   *  I stopped the PPI drip, continue BID PPI as per home routine.  IV for now til emesis proves to be resolved.  *  Probably ok to have clears.  Will defer feeding decision to MD, incase EGD pursued.    LOS: 0 days   Jennye Moccasin  07/10/2012, 12:18 PM Pager: [phone]     [facility] Attending  I have also seen and assessed the patient and agree with the above note. I do not think EGD is needed - might have had some gastropathy after vomiting - would treat conservatively.  Have started clear liquids.  Call us back if ?'s.  I appreciate the opportunity to care for this patient.  Iva Boop, MD, [facility] [phone] (pager) 07/10/2012  3:57 PM

## 2012-07-10 NOTE — Telephone Encounter (Signed)
[facility] Triage Call Report Triage Record Num: [id number] Operator: Laren Boom Patient Name: [facility] Call Date & Time: 07/09/2012 8:29:40PM Patient Phone: [phone] PCP: Darryll Capers Patient Gender: Male PCP Fax : [phone] Patient DOB: [birth year] Practice Name: [facility] Reason for Call: Caller: [name]/Spouse; PCP: Darryll Capers (Adults only); CB#: [phone]. 07/09/12 - Patient called earlier this morning (see note 07/09/12 @ 8:02 am) and was given a prescription for Zofran to help the vomiting and he has taken them 2 times. Last pill given @ 4 pm and then he vomited x 2 since 7 pm. Less than he was. Emergent Sign/Symptom of "vomiting red,bloody or coffee-ground material" per Nausea or Vomiting Protocol (disposition-call 911). Advised to call (11. Spouse states that patient does not want to go by ambulance and that she will drive him to the ER herself. Protocol(s) Used: Nausea or Vomiting Recommended Outcome per Protocol: Activate EMS 911 Reason for Outcome: Vomiting red, bloody or coffee-ground material, more than streaks of blood or scant amount (not following nosebleed within past day) Care Advice: ~ 03/

## 2012-07-10 NOTE — Progress Notes (Addendum)
[facility] PROGRESS NOTE  Joseph Hayes WUJ:[id number] DOB: [birth year] DOA: 07/10/2012 PCP: [name], MD  Assessment/Plan: 1. GI bleed, upper gi, coffee ground emesis: increase BUN at 31. Hb decrease to 10, baseline at 12. Denies abdominal pain. Continue with protonix. [facility] consulted. NPO for now.  2. UTI, WBC 11 to 20 on UA. Continue with Zosyn. Will follow urine culture.  3. Probably Right calyceal diverticulum with calculus: Will review [place] with urologist on call. Waiting call back. 4. Hypokalemia: Replace with 10 meq IV times 2 run.   Code Status: Full Code.  Family Communication: Care discussed with wife who was at bedside.  Disposition Plan: Home when stable.    Consultants:  [facility].   Procedures: Renal US : 1. No hydronephrosis.                     2. Probable right mid calyceal diverticulum with calculus as noted above. Small cysts on the left.    Antibiotics:  Zosyn 3-10  HPI/Subjective: Feeling better. He vomit multiple time, coffee ground emesis.  No abdominal pain, no NSAID use. Only 325 mg aspirin.  No back pain.    Objective: Filed Vitals:   07/10/12 0600 07/10/12 0855 07/10/12 1000 07/10/12 1023  BP: 114/64  123/74 131/61  Pulse: 69  71 70  Temp:  99.6 F (37.6 C)  98.7 F (37.1 C)  TempSrc:  Oral  Oral  Resp: 18   19  SpO2: 95%  97% 96%    Intake/Output Summary (Last 24 hours) at 07/10/12 1042 Last data filed at 07/10/12 0526  Gross per 24 hour  Intake      0 ml  Output    150 ml  Net   -150 ml   There were no vitals filed for this visit.  Exam:   General:  No distress.   Cardiovascular: S 1, S 2 RRR  Respiratory: CTA  Abdomen: Bs present, soft, NR  Musculoskeletal: no edema. Paraplegic.   Data Reviewed: Basic Metabolic Panel:  Recent Labs Lab 07/09/12 2212 07/10/12 0720  NA 136 138  K 3.4* 3.4*  CL 99 104  CO2 26 27  GLUCOSE 121* 82  BUN 31* 28*  CREATININE 0.91 0.91  CALCIUM 9.1 8.1*    Liver Function Tests:  Recent Labs Lab 07/09/12 2212 07/10/12 0720  AST 54* 55*  ALT 17 17  ALKPHOS 64 49  BILITOT 0.5 0.2*  PROT 6.3 5.1*  ALBUMIN 3.2* 2.6*   No results found for this basename: LIPASE, AMYLASE,  in the last 168 hours No results found for this basename: AMMONIA,  in the last 168 hours CBC:  Recent Labs Lab 07/09/12 2212 07/10/12 0431 07/10/12 0720  WBC 8.8 7.4 7.0  NEUTROABS 6.9  --   --   HGB 12.1* 10.4* 10.4*  HCT 36.3* 30.9* 30.6*  MCV 83.1 82.6 82.9  PLT 202 195 184   Cardiac Enzymes: No results found for this basename: CKTOTAL, CKMB, CKMBINDEX, TROPONINI,  in the last 168 hours BNP (last 3 results) No results found for this basename: PROBNP,  in the last 8760 hours CBG: No results found for this basename: GLUCAP,  in the last 168 hours  No results found for this or any previous visit (from the past 240 hour(s)).   Studies: US Renal  07/10/2012  *RADIOLOGY REPORT*  Clinical Data: Evaluate for hydronephrosis, elevated BUN and creatinine  RENAL/URINARY TRACT ULTRASOUND COMPLETE  Comparison:  CT abdomen pelvis  of 10/23/2005  Findings:  Right Kidney:  No hydronephrosis is seen.  The right kidney measures 11.0 cm sagittally.  There is a cystic structure in the lower right kidney which contains a calculus as noted previously. This may represent a calyceal diverticulum of 3.6 x 2.8 x 3.7 cm with calcification of 1.5 x 0.9 x 1.1 cm.  Left Kidney:  No hydronephrosis is noted.  The left kidney measures 11.6 cm.  Small cysts are present of no more than 1.4 cm in diameter.  Bladder:  The urinary bladder is not well distended but is unremarkable.  IMPRESSION:  1.  No hydronephrosis. 2.  Probable right mid calyceal diverticulum with calculus as noted above.  Small cysts on the left.   Original Report Authenticated By: Dwyane Dee, M.D.    Dg Abd Acute W/chest  07/10/2012  *RADIOLOGY REPORT*  Clinical Data: Vomiting  ACUTE ABDOMEN SERIES (ABDOMEN 2 VIEW & CHEST 1  VIEW)  Comparison: 10/23/2004 CT  Findings: The bowel gas pattern is non-obstructive. Calcific densities projecting over the right renal shadow, in keeping with renal stones. Nonspecific 1 cm rounded calcific density just left of the L2 vertebral body.  Otherwise, or outlines are normal where seen. No acute or aggressive osseous abnormality identified.  IMPRESSION: Nonobstructive bowel gas pattern.  Right renal stones.   Original Report Authenticated By: Jearld Lesch, M.D.     Scheduled Meds: . metoprolol tartrate  12.5 mg Oral BID  . simvastatin  40 mg Oral QPM  . sodium chloride  3 mL Intravenous Q12H   Continuous Infusions: . sodium chloride 125 mL/hr at 07/10/12 0629  . 0.9 % NaCl with KCl 20 mEq / L 75 mL/hr at 07/10/12 0842  . pantoprozole (PROTONIX) infusion 8 mg/hr (07/10/12 0554)  . piperacillin-tazobactam (ZOSYN)  IV 3.375 g (07/10/12 0924)  . potassium chloride      Principal Problem:   Nausea & vomiting Active Problems:   CAD   UTI (lower urinary tract infection)   GI bleed    Time spent: 25 minutes.     REGALADO,BELKYS  [facility] Pager [phone]. If 7PM-7AM, please contact night-coverage at [email or web address], password [facility] 07/10/2012, 10:42 AM  LOS: 0 days     Reviewed [place] with urologist on call, treat for UTI, complicated. Follow up wit [facility] outpatient.

## 2012-07-10 NOTE — ED Notes (Signed)
PT wife Joseph Hayes [phone]

## 2012-07-10 NOTE — ED Notes (Signed)
Vomited several time since yesterday, coffee grand emesis, called [facility] ordered him Zofran SL, felt chills and hot at home, felt better but started getting bad again this evening. Unable to keep fluid down. Hx spinal CVA in 2006 the left pt paralysis from the nipples down. Pt is wheelchair band, assist with ADL with equipments.

## 2012-07-10 NOTE — Progress Notes (Signed)
*  PRELIMINARY RESULTS* Vascular Ultrasound Lower extremity venous duplex has been completed.  Preliminary findings:  Bilateral:  No evidence of DVT, superficial thrombosis, or Baker's Cyst.    Farrel Demark, RDMS, RVT  07/10/2012, 8:21 AM

## 2012-07-10 NOTE — Care Management Note (Signed)
    Page 1 of 1   07/12/2012     12:04:11 PM   CARE MANAGEMENT NOTE 07/12/2012  Patient:  Joseph Hayes, Joseph Hayes   Account Number:  [id number]  Date Initiated:  07/10/2012  Documentation initiated by:  Letha Cape  Subjective/Objective Assessment:   dx upper gib, hx paraplegia  admit- lives with spouse     Action/Plan:   Anticipated DC Date:  07/12/2012   Anticipated DC Plan:  HOME W HOME HEALTH SERVICES      DC Planning Services  CM consult      Choice offered to / List presented to:             Status of service:  Completed, signed off Medicare Important Message given?   (If response is "NO", the following Medicare IM given date fields will be blank) Date Medicare IM given:   Date Additional Medicare IM given:    Discharge Disposition:  HOME/SELF CARE  Per UR Regulation:  Reviewed for med. necessity/level of care/duration of stay  If discussed at Long Length of Stay Meetings, dates discussed:    Comments:  07/11/12 17:00 Letha Cape RN,BSN [phone] patient for dc today, lives with spouse, has medication coverage and transportation.  No needs anticipated.

## 2012-07-10 NOTE — Telephone Encounter (Signed)
noted 

## 2012-07-11 LAB — CBC
HCT: 31.5 % — ABNORMAL LOW (ref 39.0–52.0)
Hemoglobin: 10.6 g/dL — ABNORMAL LOW (ref 13.0–17.0)
WBC: 5.4 10*3/uL (ref 4.0–10.5)

## 2012-07-11 LAB — BASIC METABOLIC PANEL
Chloride: 105 mEq/L (ref 96–112)
GFR calc Af Amer: >90 mL/min (ref >90)
Potassium: 3.6 mEq/L (ref 3.5–5.1)
Sodium: 139 mEq/L (ref 135–145)

## 2012-07-11 LAB — URINE CULTURE

## 2012-07-11 MED ORDER — AMOXICILLIN 500 MG PO CAPS: 500.0000 mg | ORAL_CAPSULE | Freq: Three times a day (TID) | ORAL | Status: DC

## 2012-07-11 MED ORDER — OMEPRAZOLE 20 MG PO CPDR: 20.0000 mg | DELAYED_RELEASE_CAPSULE | Freq: Every day | ORAL | Status: DC

## 2012-07-11 NOTE — Progress Notes (Signed)
Pt. discharged to floor,verbalized understanding of discharged instruction,medication,restriction,diet and follow up appointment.Baseline Vitals sign stable,Pt comfortable,no sign and symptom of distress. 

## 2012-07-11 NOTE — Discharge Summary (Signed)
Physician Discharge Summary  Joseph Hayes RUE:[id number] DOB: 1940-09-19 DOA: 07/10/2012  PCP: Joseph Mew, MD  Admit date: 07/10/2012 Discharge date: 07/11/2012  Time spent: 30 minutes  Recommendations for Outpatient Follow-up:  1. Wife will Call PCP office to follow final result of urine culture.  2. Instruct to resume aspirin in 1 to 2 days.  3.   Discharge Diagnoses:    UTI (lower urinary tract infection), enterococcus    Nausea & vomiting, Gastritis ?    CAD   GI bleed   Discharge Condition: Stable.  Diet recommendation: Heart Healthy  Filed Weights   07/11/12 0100  Weight: 77.52 kg (170 lb 14.4 oz)    History of present illness:  Joseph Hayes is a 71 y.o. male with history of CAD status post stenting, CVA with paraplegia, hypertension and hyperlipidemia has been experiencing nausea vomiting over the last 2-3 days. Denies any abdominal pain or diarrhea. Last 2-3 episodes of vomiting he had coffee-ground color vomitus. Patient also has been experiencing some subjective feeling of fever and chills. In the ER stool for occult blood was negative but gastric occult was positive. Initial hemoglobin is around 12 and repeat as been ordered. Patient will be admitted for further management. In addition patient has been experiencing some fever and chills and UA is compatible with UTI. Patient has been started by [place] for possible UTI and patient's acute abdominal series shows nonobstructive gas pattern but does show urinary stones for which I have ordered renal sonogram to rule out obstruction. Patient presently is hemodynamically stable. Denies any chest pain or shortness of breath.   Hospital Course:  1. GI bleed, upper gi, coffee ground emesis: increase BUN at 31. Hb decrease to 10, baseline at 12. Denies abdominal pain. Continue with protonix. [facility] consulted. No endoscopy at this time. Patient tolerating diet. Willing to go home. Hb stable. He will be discharge on  increase dose of protonix. Resume aspirin in 1 to 2 days due to history of stroke..  2. UTI, WBC 11 to 20 on UA. Continue with Zosyn.  urine culture grew enterococcus. Wife and patient wants to go home, they will follow final culture report with PCP tomorrow. I will discharge patient on Amoxicillin for 10 more days. Long course of antibiotics due to kidney stone.  3. Probably Right calyceal diverticulum with calculus:  Review [place] with Joseph Joseph Hayes, recommend treatment of UTI. Follow [facility] with [facility].  4. Hypokalemia: Replace with 10 meq IV times 2 run. Resolved.  5.   Procedures:  Renal US:   Consultations:  Joseph Hayes.   Discharge Exam: Filed Vitals:   07/10/12 2239 07/11/12 0100 07/11/12 0608 07/11/12 0943  BP: 116/66  135/77 117/72  Pulse: 65  65 72  Temp:   98.9 F (37.2 C)   TempSrc:   Oral   Resp:   18   Height:  5\' 9"  (1.753 m)    Weight:  77.52 kg (170 lb 14.4 oz)    SpO2:   95%     General: no distress.  Cardiovascular: S 1, S 2 RRR Respiratory: CTA  Discharge Instructions   Future Appointments Joseph Hayes Department Dept Phone   10/02/2012 8:00 AM Joseph Glaze, MD [facility] [place] [phone]       Medication List    STOP taking these medications       aspirin 325 MG tablet      TAKE these medications  amoxicillin 500 MG capsule  Commonly known as:  AMOXIL  Take 1 capsule (500 mg total) by mouth 3 (three) times daily.     metoprolol tartrate 25 MG tablet  Commonly known as:  LOPRESSOR  Take 12.5 mg by mouth 2 (two) times daily.     omeprazole 20 MG capsule  Commonly known as:  PRILOSEC  Take 1 capsule (20 mg total) by mouth daily.     simvastatin 40 MG tablet  Commonly known as:  ZOCOR  Take 40 mg by mouth every evening.     VIAGRA 100 MG tablet  Generic drug:  sildenafil  ONE BY MOUTH DAILY AS NEEDED          The results of significant diagnostics from this hospitalization (including imaging,  microbiology, ancillary and laboratory) are listed below for reference.    Significant Diagnostic Studies: US Renal  07/10/2012  *RADIOLOGY REPORT*  Clinical Data: Evaluate for hydronephrosis, elevated BUN and creatinine  RENAL/URINARY TRACT ULTRASOUND COMPLETE  Comparison:  CT abdomen pelvis of 10/23/2005  Findings:  Right Kidney:  No hydronephrosis is seen.  The right kidney measures 11.0 cm sagittally.  There is a cystic structure in the lower right kidney which contains a calculus as noted previously. This may represent a calyceal diverticulum of 3.6 x 2.8 x 3.7 cm with calcification of 1.5 x 0.9 x 1.1 cm.  Left Kidney:  No hydronephrosis is noted.  The left kidney measures 11.6 cm.  Small cysts are present of no more than 1.4 cm in diameter.  Bladder:  The urinary bladder is not well distended but is unremarkable.  IMPRESSION:  1.  No hydronephrosis. 2.  Probable right mid calyceal diverticulum with calculus as noted above.  Small cysts on the left.   Original Report Authenticated By: Joseph Hayes, M.D.    Dg Abd Acute W/chest  07/10/2012  *RADIOLOGY REPORT*  Clinical Data: Vomiting  ACUTE ABDOMEN SERIES (ABDOMEN 2 VIEW & CHEST 1 VIEW)  Comparison: 10/23/2004 CT  Findings: The bowel gas pattern is non-obstructive. Calcific densities projecting over the right renal shadow, in keeping with renal stones. Nonspecific 1 cm rounded calcific density just left of the L2 vertebral body.  Otherwise, or outlines are normal where seen. No acute or aggressive osseous abnormality identified.  IMPRESSION: Nonobstructive bowel gas pattern.  Right renal stones.   Original Report Authenticated By: Joseph Hayes, M.D.     Microbiology: Recent Results (from the past 240 hour(s))  URINE CULTURE     Status: None   Collection Time    07/10/12  2:06 AM      Result Value Range Status   Specimen Description URINE, CATHETERIZED   Final   Special Requests ADDED 0245   Final   Culture  Setup Time 07/10/2012 03:05   Final    Colony Count 60,000 COLONIES/ML   Final   Culture ENTEROCOCCUS SPECIES   Final   Report Status PENDING   Incomplete     Labs: Basic Metabolic Panel:  Recent Labs Lab 07/09/12 2212 07/10/12 0720 07/11/12 0603  NA 136 138 139  K 3.4* 3.4* 3.6  CL 99 104 105  CO2 26 27 25   GLUCOSE 121* 82 73  BUN 31* 28* 17  CREATININE 0.91 0.91 0.95  CALCIUM 9.1 8.1* 8.2*   Liver Function Tests:  Recent Labs Lab 07/09/12 2212 07/10/12 0720  AST 54* 55*  ALT 17 17  ALKPHOS 64 49  BILITOT 0.5 0.2*  PROT 6.3 5.1*  ALBUMIN 3.2* 2.6*   No results found for this basename: LIPASE, AMYLASE,  in the last 168 hours No results found for this basename: AMMONIA,  in the last 168 hours CBC:  Recent Labs Lab 07/09/12 2212 07/10/12 0431 07/10/12 0720 07/11/12 0603  WBC 8.8 7.4 7.0 5.4  NEUTROABS 6.9  --   --   --   HGB 12.1* 10.4* 10.4* 10.6*  HCT 36.3* 30.9* 30.6* 31.5*  MCV 83.1 82.6 82.9 82.2  PLT 202 195 184 194   Cardiac Enzymes: No results found for this basename: CKTOTAL, CKMB, CKMBINDEX, TROPONINI,  in the last 168 hours BNP: BNP (last 3 results) No results found for this basename: PROBNP,  in the last 8760 hours CBG: No results found for this basename: GLUCAP,  in the last 168 hours     Signed:  REGALADO,BELKYS  [facility] 07/11/2012, 10:20 AM

## 2012-07-12 ENCOUNTER — Telehealth: Payer: Self-pay | Admitting: *Deleted

## 2012-07-12 NOTE — Telephone Encounter (Signed)
Transitional care management  Admit date:07/10/12 Discharge date:07/11/2012  Discharge diagnosis:  UTI (lower urinary tract infection), enterococcus  Nausea an d vomiting, Gastritis?  CAD  GI bleed  Wife informed of correct medication-results of urine c&s back,amoxicillin 500 tid  Talked with patient and wife.  Pt is doing well.  No further n&v since discharge.no fever or chills since discharge. Patient is compliant with medication and is taking amoxicillin as directed.  Patient and wife are both knowledgeable of his condition and treatment.   Appointment made with Dr Amador Cunas for 07-18-2012

## 2012-07-18 ENCOUNTER — Ambulatory Visit: Payer: Medicare Other | Admitting: Internal Medicine

## 2012-07-24 ENCOUNTER — Ambulatory Visit (INDEPENDENT_AMBULATORY_CARE_PROVIDER_SITE_OTHER): Payer: Medicare Other | Admitting: Internal Medicine

## 2012-07-24 ENCOUNTER — Encounter: Payer: Self-pay | Admitting: Internal Medicine

## 2012-07-24 VITALS — BP 120/70 | HR 62 | Temp 97.8°F | Resp 20

## 2012-07-24 DIAGNOSIS — K219 Gastro-esophageal reflux disease without esophagitis: Secondary | ICD-10-CM

## 2012-07-24 DIAGNOSIS — I2510 Atherosclerotic heart disease of native coronary artery without angina pectoris: Secondary | ICD-10-CM

## 2012-07-24 DIAGNOSIS — I10 Essential (primary) hypertension: Secondary | ICD-10-CM

## 2012-07-24 DIAGNOSIS — K922 Gastrointestinal hemorrhage, unspecified: Secondary | ICD-10-CM

## 2012-07-24 DIAGNOSIS — N390 Urinary tract infection, site not specified: Secondary | ICD-10-CM

## 2012-07-24 DIAGNOSIS — R112 Nausea with vomiting, unspecified: Secondary | ICD-10-CM

## 2012-07-24 LAB — CBC WITH DIFFERENTIAL/PLATELET
Basophils Absolute: 0.1 10*3/uL (ref 0.0–0.1)
Eosinophils Absolute: 0.1 10*3/uL (ref 0.0–0.7)
Eosinophils Relative: 1.3 % (ref 0.0–5.0)
HCT: 33 % — ABNORMAL LOW (ref 39.0–52.0)
Lymphs Abs: 1.7 10*3/uL (ref 0.7–4.0)
MCHC: 33.5 g/dL (ref 30.0–36.0)
MCV: 84.2 fl (ref 78.0–100.0)
Monocytes Absolute: 0.7 10*3/uL (ref 0.1–1.0)
Neutrophils Relative %: 67.2 % (ref 43.0–77.0)
Platelets: 325 10*3/uL (ref 150.0–400.0)
RDW: 14.4 % (ref 11.5–14.6)
WBC: 7.9 10*3/uL (ref 4.5–10.5)

## 2012-07-24 MED ORDER — ASPIRIN 81 MG PO TBEC: 81.0000 mg | DELAYED_RELEASE_TABLET | Freq: Every day | ORAL | Status: DC

## 2012-07-24 NOTE — Progress Notes (Signed)
Subjective:    Patient ID: Joseph Hayes, male    DOB: 1941-04-04, 72 y.o.   MRN: [id number]  HPI  72 year old patient who is seen post hospital discharge. He was discharged 6 days ago presenting with nausea vomiting fever. Stool is hematest negative but he did have some heme positive gastric contents. His hematocrit drop but stabilized. He was seen in consultation by GI. Medical regimen includes aspirin 325. It was felt the patient had some mild gastritis related to his active emesis. Since his discharge he has done quite well without fever weakness and he has maintained a good appetite. He has treated hypertension coronary artery disease. No melena.  Hospital records reviewed  Past Medical History  Diagnosis Date  . Hyperlipidemia   . Hypertension   . BPH (benign prostatic hypertrophy)   . H/O: CVA (cardiovascular accident)   . Acute DVT (deep venous thrombosis)   . MI, old   . SHINGLES 04/07/2009  . Coronary artery disease   . Stroke 2006  . Hiatal hernia     wheelchair bound stroke residual    History   Social History  . Marital Status: Married    Spouse Name: N/A    Number of Children: N/A  . Years of Education: N/A   Occupational History  . self employed    Social History Main Topics  . Smoking status: Former Smoker    Quit date: 05/03/1974  . Smokeless tobacco: Former Neurosurgeon  . Alcohol Use: No  . Drug Use: No  . Sexually Active: Yes   Other Topics Concern  . Not on file   Social History Narrative  . No narrative on file    Past Surgical History  Procedure Laterality Date  . Cardiac catheterization    . Coronary angioplasty      Family History  Problem Relation Age of Onset  . Hypertension    . Alcohol abuse    . Hyperlipidemia    . Heart disease Father     No Known Allergies  Current Outpatient Prescriptions on File Prior to Visit  Medication Sig Dispense Refill  . amoxicillin (AMOXIL) 500 MG capsule Take 1 capsule (500 mg total) by mouth 3  (three) times daily.  30 capsule  0  . metoprolol tartrate (LOPRESSOR) 25 MG tablet Take 12.5 mg by mouth 2 (two) times daily.      Marland Kitchen omeprazole (PRILOSEC) 20 MG capsule Take 1 capsule (20 mg total) by mouth daily.  30 capsule  0  . simvastatin (ZOCOR) 40 MG tablet Take 40 mg by mouth every evening.      Marland Kitchen VIAGRA 100 MG tablet ONE BY MOUTH DAILY AS NEEDED  5 tablet  9   No current facility-administered medications on file prior to visit.    BP 120/70  Pulse 62  Temp(Src) 97.8 F (36.6 C) (Oral)  Resp 20  SpO2 97%       Review of Systems  Constitutional: Negative for fever, chills, appetite change and fatigue.  HENT: Negative for hearing loss, ear pain, congestion, sore throat, trouble swallowing, neck stiffness, dental problem, voice change and tinnitus.   Eyes: Negative for pain, discharge and visual disturbance.  Respiratory: Negative for cough, chest tightness, wheezing and stridor.   Cardiovascular: Negative for chest pain, palpitations and leg swelling.  Gastrointestinal: Negative for nausea, vomiting, abdominal pain, diarrhea, constipation, blood in stool and abdominal distention.  Genitourinary: Negative for urgency, hematuria, flank pain, discharge, difficulty urinating and genital sores.  Musculoskeletal: Negative  for myalgias, back pain, joint swelling, arthralgias and gait problem.  Skin: Negative for rash.  Neurological: Negative for dizziness, syncope, speech difficulty, weakness, numbness and headaches.  Hematological: Negative for adenopathy. Does not bruise/bleed easily.  Psychiatric/Behavioral: Negative for behavioral problems and dysphoric mood. The patient is not nervous/anxious.        Objective:   Physical Exam  Constitutional: He is oriented to person, place, and time. He appears well-developed.  Wheelchair-bound Alert no distress Afebrile Blood pressure 120/70 Pulse 62  HENT:  Head: Normocephalic.  Right Ear: External ear normal.  Left Ear:  External ear normal.  Eyes: Conjunctivae and EOM are normal.  Neck: Normal range of motion.  Cardiovascular: Normal rate and normal heart sounds.   Pulmonary/Chest: Breath sounds normal.  Abdominal: Bowel sounds are normal.  Musculoskeletal: Normal range of motion. He exhibits no edema and no tenderness.  Neurological: He is alert and oriented to person, place, and time.  Psychiatric: He has a normal mood and affect. His behavior is normal.          Assessment & Plan:   History of mild upper GI bleeding and anemia. We'll check a followup CBC Hypertension stable CAD stable Dyslipidemia Nausea and vomiting resolved

## 2012-07-24 NOTE — Patient Instructions (Signed)
Limit your sodium (Salt) intake  Decrease aspirin to 81 mg daily  Return in 6 months for follow-up

## 2012-08-09 DIAGNOSIS — B952 Enterococcus as the cause of diseases classified elsewhere: Secondary | ICD-10-CM

## 2012-08-09 DIAGNOSIS — N390 Urinary tract infection, site not specified: Secondary | ICD-10-CM

## 2012-08-09 DIAGNOSIS — K922 Gastrointestinal hemorrhage, unspecified: Secondary | ICD-10-CM

## 2012-08-09 DIAGNOSIS — R112 Nausea with vomiting, unspecified: Secondary | ICD-10-CM

## 2012-08-09 DIAGNOSIS — K219 Gastro-esophageal reflux disease without esophagitis: Secondary | ICD-10-CM

## 2012-08-09 DIAGNOSIS — I2510 Atherosclerotic heart disease of native coronary artery without angina pectoris: Secondary | ICD-10-CM

## 2012-10-02 ENCOUNTER — Ambulatory Visit (INDEPENDENT_AMBULATORY_CARE_PROVIDER_SITE_OTHER): Payer: Medicare Other | Admitting: Internal Medicine

## 2012-10-02 ENCOUNTER — Encounter: Payer: Self-pay | Admitting: Internal Medicine

## 2012-10-02 VITALS — BP 136/82 | HR 72 | Temp 98.2°F | Resp 16 | Ht 69.0 in | Wt 180.0 lb

## 2012-10-02 DIAGNOSIS — Z8619 Personal history of other infectious and parasitic diseases: Secondary | ICD-10-CM

## 2012-10-02 DIAGNOSIS — E785 Hyperlipidemia, unspecified: Secondary | ICD-10-CM

## 2012-10-02 DIAGNOSIS — N390 Urinary tract infection, site not specified: Secondary | ICD-10-CM

## 2012-10-02 DIAGNOSIS — D649 Anemia, unspecified: Secondary | ICD-10-CM

## 2012-10-02 DIAGNOSIS — I10 Essential (primary) hypertension: Secondary | ICD-10-CM

## 2012-10-02 DIAGNOSIS — Z23 Encounter for immunization: Secondary | ICD-10-CM

## 2012-10-02 LAB — BASIC METABOLIC PANEL
BUN: 20 mg/dL (ref 6–23)
Calcium: 8.8 mg/dL (ref 8.4–10.5)
Chloride: 105 mEq/L (ref 96–112)
Creatinine, Ser: 0.8 mg/dL (ref 0.4–1.5)
GFR: 97.39 mL/min (ref >60.00)

## 2012-10-02 LAB — IRON: Iron: 26 ug/dL — ABNORMAL LOW (ref 42–165)

## 2012-10-02 LAB — LIPID PANEL
Cholesterol: 154 mg/dL (ref 0–200)
LDL Cholesterol: 77 mg/dL (ref 0–99)
Triglycerides: 138 mg/dL (ref 0.0–149.0)

## 2012-10-02 MED ORDER — CIPROFLOXACIN HCL 500 MG PO TABS: 500.0000 mg | ORAL_TABLET | Freq: Two times a day (BID) | ORAL | Status: DC | PRN

## 2012-10-02 NOTE — Patient Instructions (Addendum)
Take 2000 iu of vit d a day For the eczema

## 2012-10-02 NOTE — Progress Notes (Signed)
Subjective:    Patient ID: Joseph Hayes, male    DOB: 1940-06-14, 72 y.o.   MRN: [id number]  HPI  Has easy bruising and has chronic anemia possible blood loss anemia Late effects of CVA No symptoms Last CBC stable   Review of Systems  Constitutional: Negative.        Wheel chair   Eyes: Negative.   Respiratory: Negative.   Cardiovascular: Negative.   Gastrointestinal: Negative.   Genitourinary:       In and out cath  Neurological: Negative.   Hematological: Bruises/bleeds easily.   Past Medical History  Diagnosis Date  . Hyperlipidemia   . Hypertension   . BPH (benign prostatic hypertrophy)   . H/O: CVA (cardiovascular accident)   . Acute DVT (deep venous thrombosis)   . MI, old   . SHINGLES 04/07/2009  . Coronary artery disease   . Stroke 2006  . Hiatal hernia     wheelchair bound stroke residual    History   Social History  . Marital Status: Married    Spouse Name: N/A    Number of Children: N/A  . Years of Education: N/A   Occupational History  . self employed    Social History Main Topics  . Smoking status: Former Smoker    Quit date: 05/03/1974  . Smokeless tobacco: Former Neurosurgeon  . Alcohol Use: No  . Drug Use: No  . Sexually Active: Yes   Other Topics Concern  . Not on file   Social History Narrative  . No narrative on file    Past Surgical History  Procedure Laterality Date  . Cardiac catheterization    . Coronary angioplasty      Family History  Problem Relation Age of Onset  . Hypertension    . Alcohol abuse    . Hyperlipidemia    . Heart disease Father     No Known Allergies  Current Outpatient Prescriptions on File Prior to Visit  Medication Sig Dispense Refill  . aspirin 81 MG EC tablet Take 1 tablet (81 mg total) by mouth daily. Swallow whole.  30 tablet  12  . metoprolol tartrate (LOPRESSOR) 25 MG tablet Take 12.5 mg by mouth 2 (two) times daily.      Marland Kitchen omeprazole (PRILOSEC) 20 MG capsule Take 1 capsule (20 mg total)  by mouth daily.  30 capsule  0  . simvastatin (ZOCOR) 40 MG tablet Take 40 mg by mouth every evening.      Marland Kitchen VIAGRA 100 MG tablet ONE BY MOUTH DAILY AS NEEDED  5 tablet  9   No current facility-administered medications on file prior to visit.    BP 136/82  Pulse 72  Temp(Src) 98.2 F (36.8 C)  Resp 16  Ht 5\' 9"  (1.753 m)  Wt 180 lb (81.647 kg)  BMI 26.57 kg/m2        Objective:   Physical Exam  Constitutional: He appears well-developed and well-nourished.  HENT:  Head: Normocephalic and atraumatic.  Eyes: Conjunctivae are normal. Pupils are equal, round, and reactive to light.  Neck: Normal range of motion. Neck supple.  Cardiovascular: Normal rate and regular rhythm.   Pulmonary/Chest: Effort normal and breath sounds normal.  Abdominal: Soft. Bowel sounds are normal.  Skin:  Clinical cool          Assessment & Plan:  Patient presents for followup of the last office visit with Dr. Shawnie Pons doses were reviewed.  Patient has a history of chronic UTIs related  to in and out catheterization from late effects of CVA.  Ciprofloxacin prescription was given since the pharmacy.  Patient is due monitoring for lipid renal function and CBC.  We will add iron to the folic acid and B12 for one month. Time and continue the PPI we will reduce the aspirin to every other day

## 2012-10-05 LAB — METHYLMALONIC ACID, SERUM: Methylmalonic Acid, Quant: 0.15 umol/L (ref <0.40)

## 2012-11-28 ENCOUNTER — Other Ambulatory Visit: Payer: Self-pay | Admitting: Internal Medicine

## 2012-12-05 ENCOUNTER — Other Ambulatory Visit: Payer: Self-pay | Admitting: Internal Medicine

## 2012-12-08 ENCOUNTER — Other Ambulatory Visit: Payer: Self-pay | Admitting: Internal Medicine

## 2012-12-31 ENCOUNTER — Other Ambulatory Visit: Payer: Self-pay | Admitting: Internal Medicine

## 2013-01-05 ENCOUNTER — Other Ambulatory Visit: Payer: Self-pay | Admitting: Internal Medicine

## 2013-01-21 ENCOUNTER — Other Ambulatory Visit: Payer: Self-pay | Admitting: Internal Medicine

## 2013-01-25 ENCOUNTER — Other Ambulatory Visit: Payer: Self-pay | Admitting: Internal Medicine

## 2013-01-30 ENCOUNTER — Other Ambulatory Visit: Payer: Self-pay | Admitting: Internal Medicine

## 2013-02-05 ENCOUNTER — Ambulatory Visit: Payer: Medicare Other | Admitting: Internal Medicine

## 2013-03-01 ENCOUNTER — Other Ambulatory Visit: Payer: Self-pay | Admitting: Internal Medicine

## 2013-03-05 ENCOUNTER — Ambulatory Visit (INDEPENDENT_AMBULATORY_CARE_PROVIDER_SITE_OTHER): Payer: Medicare Other | Admitting: Internal Medicine

## 2013-03-05 ENCOUNTER — Encounter: Payer: Self-pay | Admitting: Internal Medicine

## 2013-03-05 VITALS — BP 120/76 | HR 72 | Temp 98.2°F | Resp 16 | Ht 69.0 in

## 2013-03-05 DIAGNOSIS — H01139 Eczematous dermatitis of unspecified eye, unspecified eyelid: Secondary | ICD-10-CM

## 2013-03-05 DIAGNOSIS — D509 Iron deficiency anemia, unspecified: Secondary | ICD-10-CM

## 2013-03-05 DIAGNOSIS — E785 Hyperlipidemia, unspecified: Secondary | ICD-10-CM

## 2013-03-05 DIAGNOSIS — G9519 Other vascular myelopathies: Secondary | ICD-10-CM

## 2013-03-05 DIAGNOSIS — I10 Essential (primary) hypertension: Secondary | ICD-10-CM

## 2013-03-05 DIAGNOSIS — Z23 Encounter for immunization: Secondary | ICD-10-CM

## 2013-03-05 LAB — CBC WITH DIFFERENTIAL/PLATELET
Basophils Relative: 0.4 % (ref 0.0–3.0)
HCT: 39.9 % (ref 39.0–52.0)
Hemoglobin: 13.6 g/dL (ref 13.0–17.0)
Lymphocytes Relative: 23.3 % (ref 12.0–46.0)
Lymphs Abs: 1.7 10*3/uL (ref 0.7–4.0)
MCHC: 34 g/dL (ref 30.0–36.0)
Monocytes Relative: 7.3 % (ref 3.0–12.0)
Neutro Abs: 4.7 10*3/uL (ref 1.4–7.7)
RBC: 4.49 Mil/uL (ref 4.22–5.81)

## 2013-03-05 LAB — LIPID PANEL
Cholesterol: 158 mg/dL (ref 0–200)
LDL Cholesterol: 80 mg/dL (ref 0–99)
Total CHOL/HDL Ratio: 3

## 2013-03-05 LAB — HEPATIC FUNCTION PANEL
ALT: 14 U/L (ref 0–53)
AST: 14 U/L (ref 0–37)
Total Bilirubin: 0.6 mg/dL (ref 0.3–1.2)
Total Protein: 6.7 g/dL (ref 6.0–8.3)

## 2013-03-05 LAB — BASIC METABOLIC PANEL
BUN: 17 mg/dL (ref 6–23)
CO2: 29 mEq/L (ref 19–32)
Chloride: 104 mEq/L (ref 96–112)
Creatinine, Ser: 0.7 mg/dL (ref 0.4–1.5)
Potassium: 3.8 mEq/L (ref 3.5–5.1)

## 2013-03-05 MED ORDER — VITAMIN D 1000 UNITS PO CAPS: 1000.0000 [IU] | ORAL_CAPSULE | Freq: Every day | ORAL | Status: DC

## 2013-03-05 NOTE — Addendum Note (Signed)
Addended by: Willy Eddy on: 03/05/2013 12:26 PM   Modules accepted: Orders

## 2013-03-05 NOTE — Patient Instructions (Signed)
The patient is instructed to continue all medications as prescribed. Schedule followup with check out clerk upon leaving the clinic  

## 2013-03-05 NOTE — Progress Notes (Signed)
Subjective:    Patient ID: Joseph Hayes, male    DOB: Oct 31, 1940, 72 y.o.   MRN: [id number]  HPI Lipid follow up Fasting for blood work today.  Hypertension is well controlled on his current medications.  Patient also received shingles vaccination today.     Review of Systems  Constitutional: Negative for fever and fatigue.  HENT: Positive for postnasal drip and rhinorrhea. Negative for congestion and hearing loss.   Eyes: Negative for discharge, redness and visual disturbance.  Respiratory: Negative for cough, shortness of breath and wheezing.   Cardiovascular: Negative for leg swelling.  Gastrointestinal: Negative for abdominal pain, constipation and abdominal distention.  Genitourinary: Negative for urgency and frequency.  Musculoskeletal: Positive for gait problem and neck stiffness. Negative for arthralgias, joint swelling and neck pain.  Skin: Negative for color change and rash.  Neurological: Negative for weakness and light-headedness.  Hematological: Negative for adenopathy.  Psychiatric/Behavioral: Negative for behavioral problems.   Past Medical History  Diagnosis Date  . Hyperlipidemia   . Hypertension   . BPH (benign prostatic hypertrophy)   . H/O: CVA (cardiovascular accident)   . Acute DVT (deep venous thrombosis)   . MI, old   . SHINGLES 04/07/2009  . Coronary artery disease   . Stroke 2006  . Hiatal hernia     wheelchair bound stroke residual    History   Social History  . Marital Status: Married    Spouse Name: N/A    Number of Children: N/A  . Years of Education: N/A   Occupational History  . self employed    Social History Main Topics  . Smoking status: Former Smoker    Quit date: 05/03/1974  . Smokeless tobacco: Former Neurosurgeon  . Alcohol Use: No  . Drug Use: No  . Sexual Activity: Yes   Other Topics Concern  . Not on file   Social History Narrative  . No narrative on file    Past Surgical History  Procedure Laterality Date  .  Cardiac catheterization    . Coronary angioplasty      Family History  Problem Relation Age of Onset  . Hypertension    . Alcohol abuse    . Hyperlipidemia    . Heart disease Father     No Known Allergies  Current Outpatient Prescriptions on File Prior to Visit  Medication Sig Dispense Refill  . aspirin 81 MG EC tablet Take 1 tablet (81 mg total) by mouth daily. Swallow whole.  30 tablet  12  . ciprofloxacin (CIPRO) 500 MG tablet TAKE 1 TABLET TWICE A DAY AS NEEDED  20 tablet  2  . [facility] VITAMIN B-12 500 MCG tablet PLACE 1 TABLET UNDER TONGUE DAILY  100 tablet  3  . [facility]S VITAMIN B-12 500 MCG tablet PLACE 1 TABLET UNDER TONGUE DAILY  100 tablet  3  . folic acid (FOLVITE) 1 MG tablet TAKE 1 TABLET BY MOUTH DAILY  30 tablet  4  . folic acid (FOLVITE) 1 MG tablet TAKE 1 TABLET BY MOUTH DAILY  30 tablet  11  . folic acid (FOLVITE) 1 MG tablet TAKE 1 TABLET BY MOUTH DAILY  30 tablet  11  . folic acid (FOLVITE) 1 MG tablet TAKE 1 TABLET BY MOUTH DAILY  30 tablet  11  . metoprolol tartrate (LOPRESSOR) 25 MG tablet Take 12.5 mg by mouth 2 (two) times daily.      . metoprolol tartrate (LOPRESSOR) 25 MG tablet TAKE HALF TABLET BY MOUTH TWO  TIMES A DAY  90 tablet  3  . metoprolol tartrate (LOPRESSOR) 25 MG tablet TAKE HALF TABLET BY MOUTH TWO TIMES A DAY  90 tablet  3  . omeprazole (PRILOSEC) 20 MG capsule Take 1 capsule (20 mg total) by mouth daily.  30 capsule  0  . simvastatin (ZOCOR) 40 MG tablet Take 40 mg by mouth every evening.      Marland Kitchen VIAGRA 100 MG tablet ONE BY MOUTH DAILY AS NEEDED  5 tablet  9   No current facility-administered medications on file prior to visit.    BP 120/76  Pulse 72  Temp(Src) 98.2 F (36.8 C)  Resp 16  Ht 5\' 9"  (1.753 m)       Objective:   Physical Exam  Nursing note and vitals reviewed. Constitutional: He appears well-developed and well-nourished.  HENT:  Head: Normocephalic and atraumatic.  Eyes: Conjunctivae are normal. Pupils are equal, round, and  reactive to light.  Neck: Normal range of motion. Neck supple.  Cardiovascular: Normal rate and regular rhythm.   Murmur heard. Pulmonary/Chest: Effort normal and breath sounds normal.  Abdominal: Soft. Bowel sounds are normal.          Assessment & Plan:  We'll monitor his iron level and CBC after he took a vitamin for building up his iron supplies.  We will monitor his  his lipids today and liver today.  And we'll get the neck to look at his hypertensive medication side effect.

## 2013-03-07 ENCOUNTER — Other Ambulatory Visit: Payer: Self-pay | Admitting: Internal Medicine

## 2013-03-14 ENCOUNTER — Other Ambulatory Visit: Payer: Self-pay | Admitting: Internal Medicine

## 2013-07-06 ENCOUNTER — Telehealth: Payer: Self-pay | Admitting: Internal Medicine

## 2013-07-06 NOTE — Telephone Encounter (Signed)
Spouse is calling to let the office know that pt is soon out of catheters. He uses 14Fr catheters. Pt needs them called into [facility] (they have sent a fax with no response)

## 2013-07-12 NOTE — Telephone Encounter (Signed)
Left message on Joseph Hayes's voicemail that we have not received a fax.  Told her to call the company and have them refax. Left Joseph Hayes our fax number and told her to call me back if she had any questions

## 2013-07-13 ENCOUNTER — Telehealth: Payer: Self-pay | Admitting: Internal Medicine

## 2013-07-13 NOTE — Telephone Encounter (Signed)
[employer] is calling needing new rx for 14 fr 16 inch intermittent straight catheters 150 per month and supplies and a prn times one year. Pt needs asap.

## 2013-07-16 NOTE — Telephone Encounter (Signed)
rx faxed

## 2013-07-16 NOTE — Telephone Encounter (Signed)
Roderic Palau called back regarding the RX.  [phone] (f)

## 2013-07-16 NOTE — Telephone Encounter (Signed)
Left message for pt to call back  °

## 2013-07-23 ENCOUNTER — Encounter: Payer: Self-pay | Admitting: Internal Medicine

## 2013-07-23 ENCOUNTER — Ambulatory Visit (INDEPENDENT_AMBULATORY_CARE_PROVIDER_SITE_OTHER): Payer: Medicare Other | Admitting: Internal Medicine

## 2013-07-23 VITALS — BP 130/90 | HR 64 | Temp 97.9°F

## 2013-07-23 DIAGNOSIS — I10 Essential (primary) hypertension: Secondary | ICD-10-CM

## 2013-07-23 DIAGNOSIS — L409 Psoriasis, unspecified: Secondary | ICD-10-CM | POA: Insufficient documentation

## 2013-07-23 DIAGNOSIS — L408 Other psoriasis: Secondary | ICD-10-CM

## 2013-07-23 MED ORDER — VITAMIN D3 50 MCG (2000 UT) PO CAPS: 2000.0000 [IU] | ORAL_CAPSULE | Freq: Every day | ORAL | Status: DC

## 2013-07-23 MED ORDER — TRIAMCINOLONE ACETONIDE 0.1 % EX CREA
1.0000 | TOPICAL_CREAM | Freq: Two times a day (BID) | CUTANEOUS | Status: DC
Start: 2013-07-23 — End: 2013-08-15

## 2013-07-23 NOTE — Patient Instructions (Signed)
The patient is instructed to continue all medications as prescribed. Schedule followup with check out clerk upon leaving the clinic  

## 2013-07-23 NOTE — Progress Notes (Signed)
   Subjective:    Patient ID: Joseph Hayes, male    DOB: 19-Dec-1940, 73 y.o.   MRN: [id number]  HPI HTN and late effects of CVA Lipid management   Review of Systems  Constitutional: Positive for activity change, appetite change and fatigue.  HENT: Negative for ear discharge and facial swelling.   Respiratory: Negative for cough.   Endocrine: Negative for polydipsia.  Genitourinary: Negative for hematuria and genital sores.  Musculoskeletal: Positive for gait problem.  Skin: Positive for rash.       eczema    The skin lesion look like psoriasis    Objective:   Physical Exam  Nursing note and vitals reviewed. Constitutional: He is oriented to person, place, and time.  Neurological: He is alert and oriented to person, place, and time.  Skin: Skin is warm and dry.  Psychiatric: He has a normal mood and affect. His behavior is normal.    psoriaform plaques      Assessment & Plan:  Psoriasis Vit d and steroid cream In and out caths Doing well and stable blood pressure

## 2013-07-23 NOTE — Progress Notes (Signed)
Pre visit review using our clinic review tool, if applicable. No additional management support is needed unless otherwise documented below in the visit note. 

## 2013-07-24 ENCOUNTER — Telehealth: Payer: Self-pay | Admitting: Internal Medicine

## 2013-07-24 NOTE — Telephone Encounter (Signed)
Relevant patient education assigned to patient using Emmi. ° °

## 2013-08-15 ENCOUNTER — Other Ambulatory Visit: Payer: Self-pay

## 2013-08-15 MED ORDER — TRIAMCINOLONE ACETONIDE 0.1 % EX CREA: 1.0000 "application " | TOPICAL_CREAM | Freq: Two times a day (BID) | CUTANEOUS | Status: DC

## 2013-08-19 ENCOUNTER — Other Ambulatory Visit: Payer: Self-pay | Admitting: Internal Medicine

## 2013-08-20 ENCOUNTER — Other Ambulatory Visit: Payer: Self-pay

## 2013-08-20 MED ORDER — CIPROFLOXACIN HCL 500 MG PO TABS: 500.0000 mg | ORAL_TABLET | Freq: Two times a day (BID) | ORAL | Status: DC

## 2013-12-25 ENCOUNTER — Other Ambulatory Visit: Payer: Self-pay | Admitting: Internal Medicine

## 2014-01-03 ENCOUNTER — Telehealth: Payer: Self-pay | Admitting: Internal Medicine

## 2014-01-03 NOTE — Telephone Encounter (Addendum)
Per Dr Silvio Pate, pt can est w/ him. LM on VM.phone note in wife's chart, Joseph Hayes

## 2014-01-18 IMAGING — US US RENAL
1 series · 14 of 25 positions shown · non-contrast
Comparison: CT abdomen pelvis of 10/23/2005

CLINICAL DATA: Evaluate for hydronephrosis, elevated BUN and
creatinine

RENAL/URINARY TRACT ULTRASOUND COMPLETE

[Series 1: us renal · 0.28mm/px · 14 of 41 slices shown]
[im 1/41]
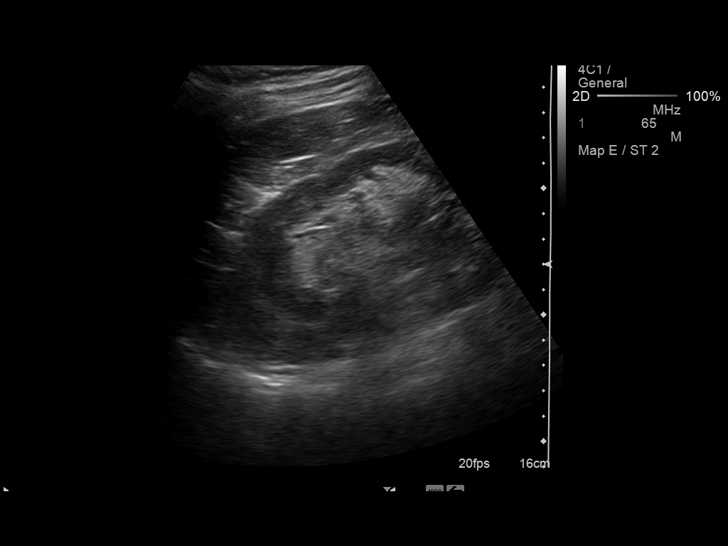
[im 4/41]
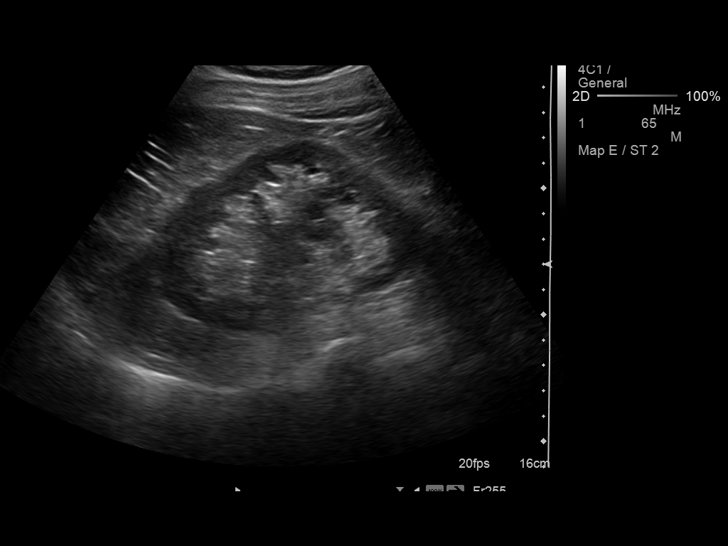
[im 7/41]
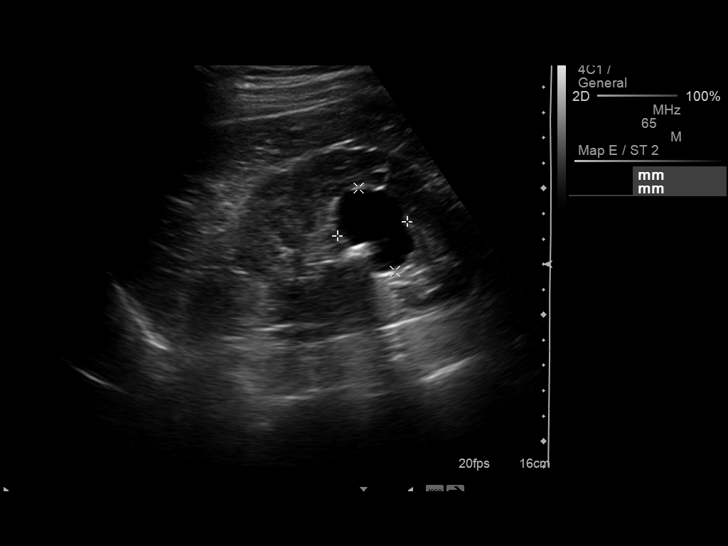
[im 11/41]
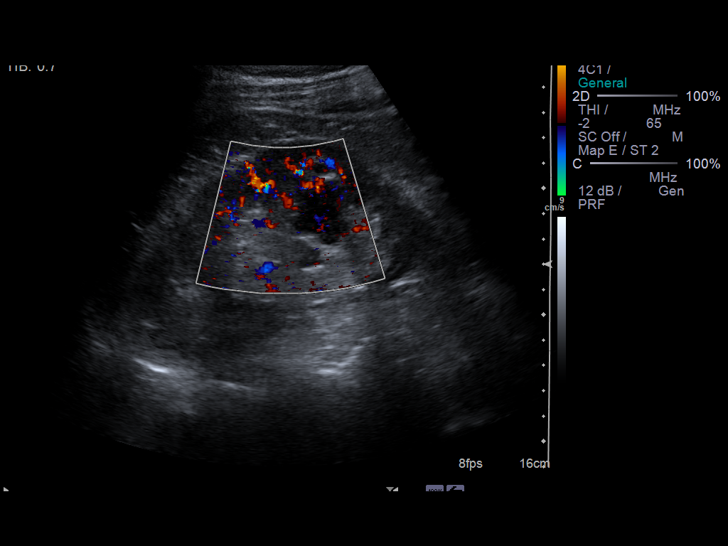
[im 14/41]
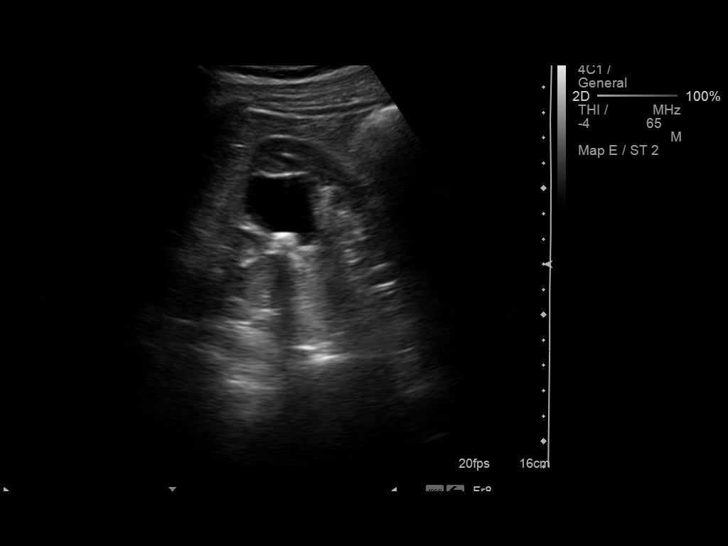
[im 16/41]
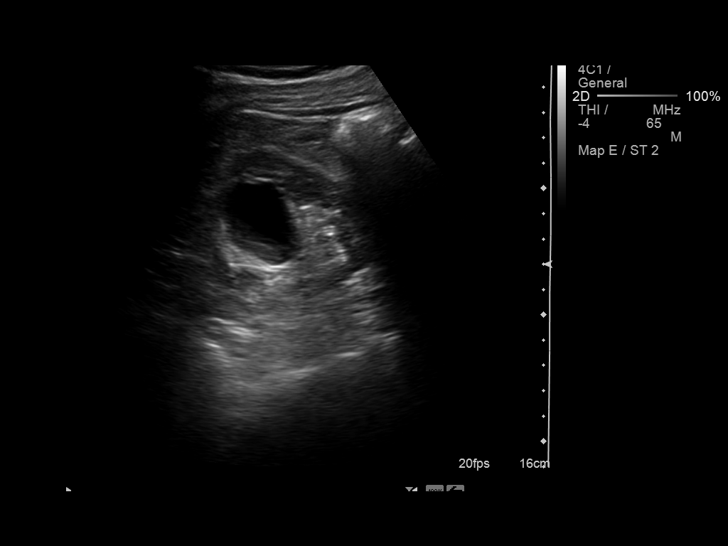
[im 19/41]
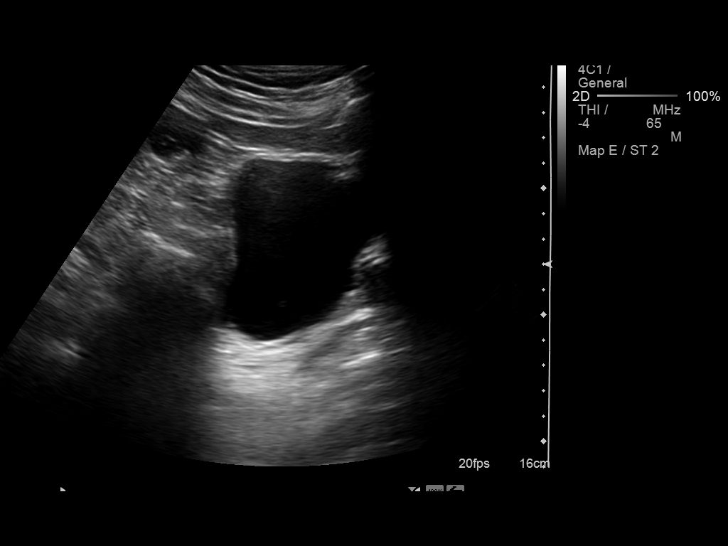
[im 22/41]
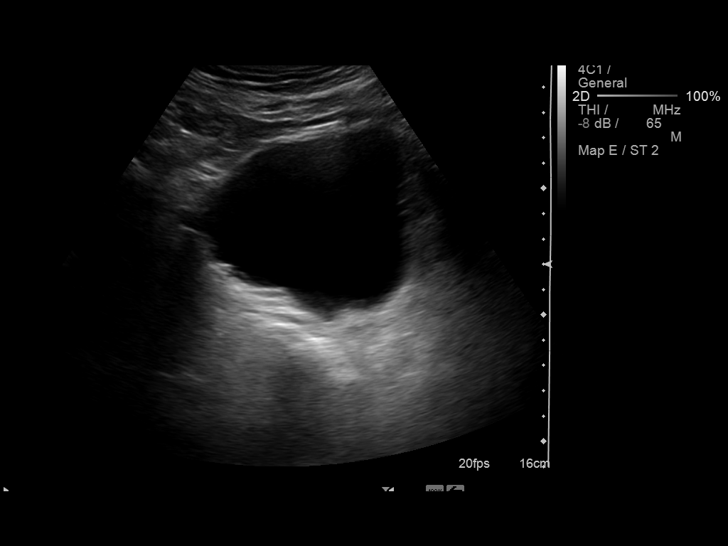
[im 26/41]
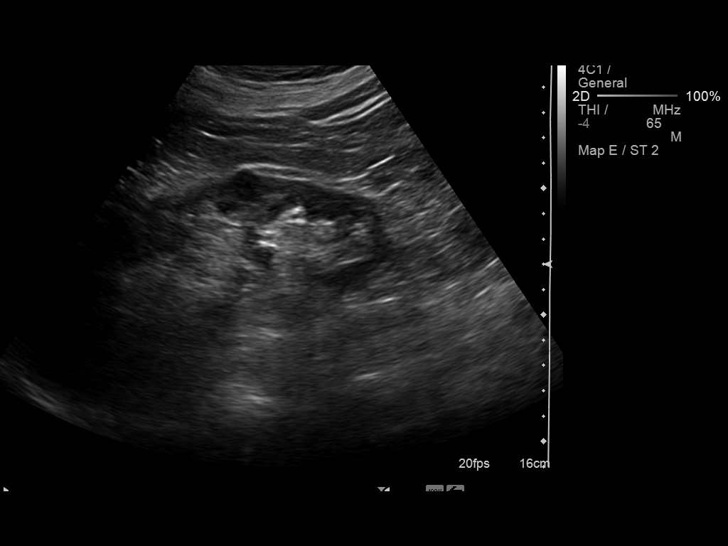
[im 27/41]
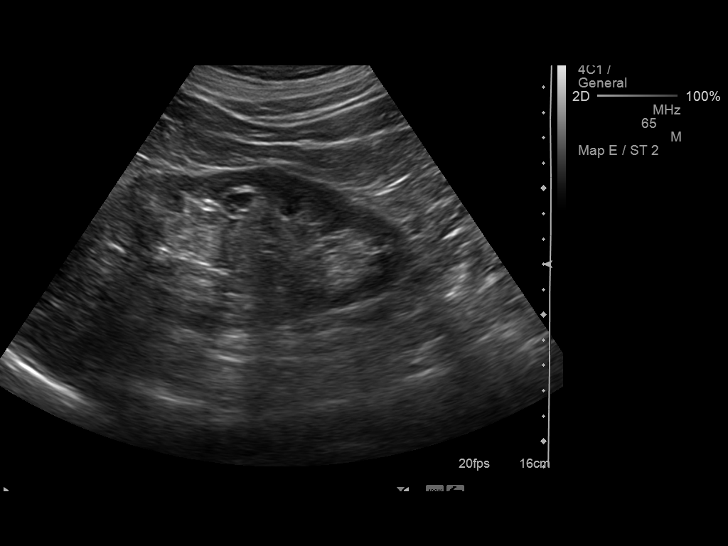
[im 31/41]
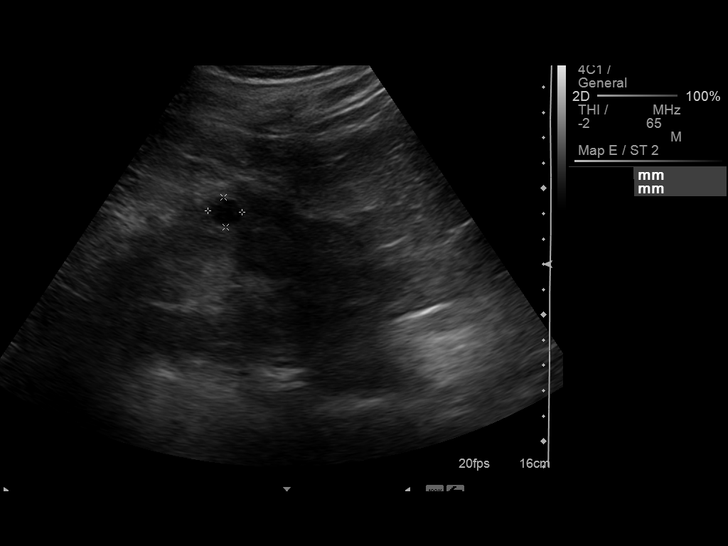
[im 34/41]
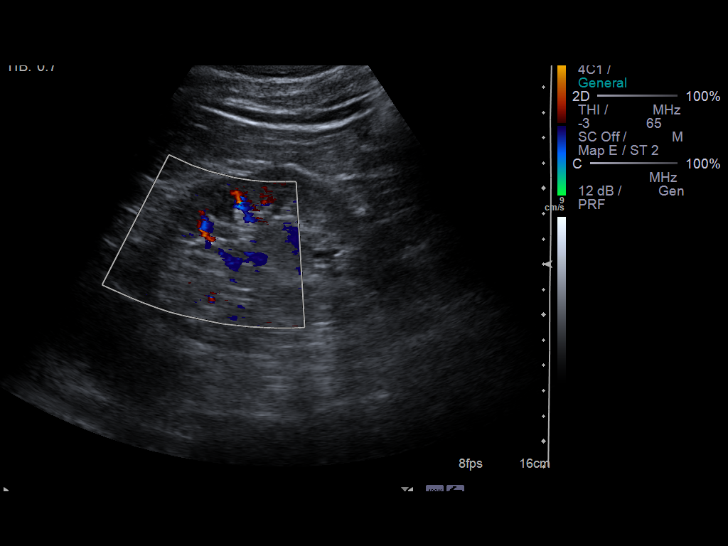
[im 37/41]
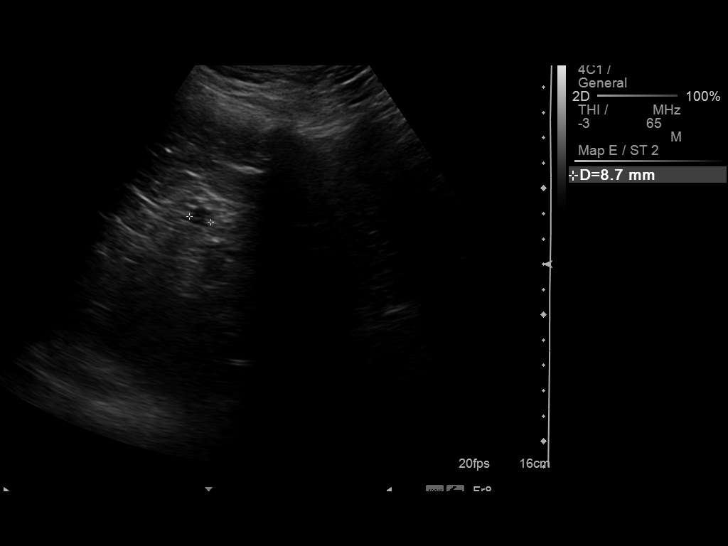
[im 41/41]
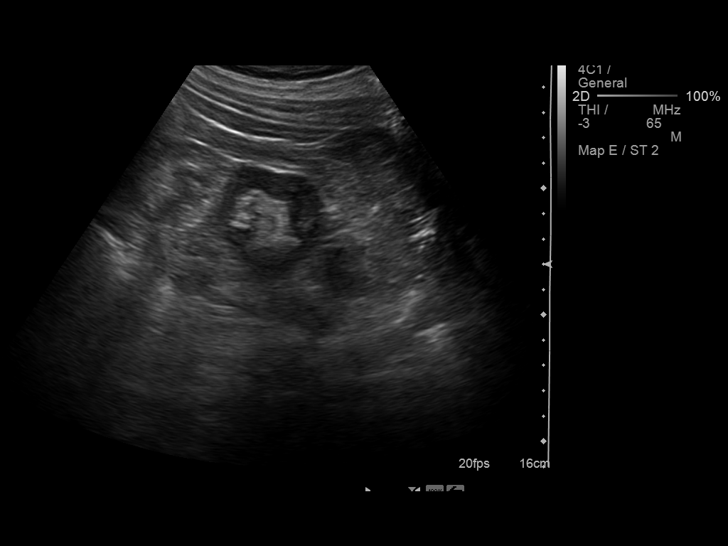

[14 of 25 positions shown; findings below may reference images not displayed]

FINDINGS: Right Kidney:  No hydronephrosis is seen.  The right kidney
measures 11.0 cm sagittally.  There is a cystic structure in the
lower right kidney which contains a calculus as noted previously.
This may represent a calyceal diverticulum of 3.6 x 2.8 x 3.7 cm
with calcification of 1.5 x 0.9 x 1.1 cm.

Left Kidney:  No hydronephrosis is noted.  The left kidney measures
11.6 cm.  Small cysts are present of no more than 1.4 cm in
diameter.

Bladder:  The urinary bladder is not well distended but is
unremarkable.
IMPRESSION: 1.  No hydronephrosis.
2.  Probable right mid calyceal diverticulum with calculus as noted
above.  Small cysts on the left.

## 2014-02-05 ENCOUNTER — Other Ambulatory Visit: Payer: Self-pay | Admitting: Internal Medicine

## 2014-02-05 NOTE — Telephone Encounter (Signed)
Spoke to patient's wife and she went ahead and scheduled an appointment and the first available for a medicare is 07/02/14. Is it okay to refill until that appointment? See note from [place] regarding her conversation with the patient.

## 2014-02-05 NOTE — Telephone Encounter (Signed)
I have never seen him---confirm he is switching to me. If so, he needs to set up an appt in the next few months. Okay #30 x 3 pending appt

## 2014-02-05 NOTE — Telephone Encounter (Signed)
Spoke to patient per Dr. Alla German comments. Patient stated that he is not a patient of Dr. Alla German and he is not sure if he will become a patient of his in the future. Patient stated that his wife usually handles things like this and she is not home right now. Rx was denied at this time. Patient informed that he has to schedule an appointment before we are able to refill medications for him. Patient verbalized understanding.

## 2014-02-06 MED ORDER — FOLIC ACID 1 MG PO TABS: ORAL_TABLET | ORAL | Status: DC

## 2014-02-06 NOTE — Addendum Note (Signed)
Addended by: Geni Bers on: 02/06/2014 10:58 AM   Modules accepted: Orders

## 2014-02-06 NOTE — Telephone Encounter (Signed)
Rx sent to pharmacy   

## 2014-02-06 NOTE — Telephone Encounter (Signed)
Okay to refill till that time

## 2014-02-15 ENCOUNTER — Other Ambulatory Visit: Payer: Self-pay

## 2014-02-25 ENCOUNTER — Encounter: Payer: Medicare Other | Admitting: Internal Medicine

## 2014-03-06 ENCOUNTER — Other Ambulatory Visit: Payer: Self-pay | Admitting: Internal Medicine

## 2014-03-07 ENCOUNTER — Telehealth: Payer: Self-pay | Admitting: Internal Medicine

## 2014-03-07 MED ORDER — METOPROLOL TARTRATE 25 MG PO TABS: ORAL_TABLET | ORAL | Status: DC

## 2014-03-07 NOTE — Telephone Encounter (Signed)
rx sent in electronically 

## 2014-03-07 NOTE — Telephone Encounter (Signed)
[id number] [place] [place] is requesting re-fill on metoprolol tartrate (LOPRESSOR) 25 MG tablet. Pt had an appt to est with Dr. Silvio Pate but it was cancelled and pt is now scheduled to see Dr. Yong Channel in March.

## 2014-03-19 ENCOUNTER — Telehealth: Payer: Self-pay | Admitting: Internal Medicine

## 2014-03-19 MED ORDER — CYANOCOBALAMIN 500 MCG PO TABS: ORAL_TABLET | ORAL | Status: DC

## 2014-03-19 NOTE — Telephone Encounter (Signed)
[id number] [place], [place] - [place] is requesting re-fill on [facility]S VITAMIN B-12 500 MCG tablet  Pt cancelled his appointment with Dr. Silvio Pate and is scheduled with Dr. Yong Channel in March, formal Dr. Arnoldo Morale patient.

## 2014-03-19 NOTE — Telephone Encounter (Signed)
rx sent in electronically 

## 2014-07-02 ENCOUNTER — Ambulatory Visit: Payer: Medicare Other | Admitting: Internal Medicine

## 2014-07-10 ENCOUNTER — Encounter: Payer: Self-pay | Admitting: Family Medicine

## 2014-07-10 ENCOUNTER — Ambulatory Visit (INDEPENDENT_AMBULATORY_CARE_PROVIDER_SITE_OTHER): Payer: Medicare Other | Admitting: Family Medicine

## 2014-07-10 VITALS — BP 120/78 | HR 60 | Temp 98.0°F

## 2014-07-10 DIAGNOSIS — Z23 Encounter for immunization: Secondary | ICD-10-CM

## 2014-07-10 DIAGNOSIS — E785 Hyperlipidemia, unspecified: Secondary | ICD-10-CM

## 2014-07-10 DIAGNOSIS — I2510 Atherosclerotic heart disease of native coronary artery without angina pectoris: Secondary | ICD-10-CM

## 2014-07-10 DIAGNOSIS — I10 Essential (primary) hypertension: Secondary | ICD-10-CM

## 2014-07-10 DIAGNOSIS — L309 Dermatitis, unspecified: Secondary | ICD-10-CM | POA: Insufficient documentation

## 2014-07-10 MED ORDER — SIMVASTATIN 40 MG PO TABS: ORAL_TABLET | ORAL | Status: DC

## 2014-07-10 MED ORDER — MECLIZINE HCL 25 MG PO TABS: 25.0000 mg | ORAL_TABLET | Freq: Three times a day (TID) | ORAL | Status: DC | PRN

## 2014-07-10 NOTE — Assessment & Plan Note (Signed)
Has been on simvastatin but out for a few weeks. Restart today and repeat lipids 6 weeks from today.

## 2014-07-10 NOTE — Assessment & Plan Note (Signed)
Controlled on Metoprolol 12.5mg  BID

## 2014-07-10 NOTE — Assessment & Plan Note (Signed)
asymptomatic but minimal activity. Continue ASA-can only tolerate 3 days a week due to bruising. Continue metoprolol and simvastatin. Not seeing cardiology.

## 2014-07-10 NOTE — Progress Notes (Signed)
Joseph Reddish, MD Phone: [phone]  Subjective:  Patient presents today to establish care with me as their new primary care provider. Patient was formerly a patient of Dr. Arnoldo Morale. Chief complaint-noted.   CAD- asymptomatic but minimal activity Hyperlipidemia- controlled but needs updated labs  Lab Results  Component Value Date   LDLCALC 80 03/05/2013   On statin: simvastatin Regular exercise: physically not able ROS- no chest pain or shortness of breath. No myalgias  Hypertension-controlled  BP Readings from Last 3 Encounters:  07/10/14 120/78  07/23/13 130/90  03/05/13 120/76   Home BP monitoring-no Compliant with medications-yes without side effects ROS-Denies any CP, HA, SOB, blurry vision.   At age MI 41, spinal infarction 89 which has left him wheelchair bound.  Incontinent of stools. Disimpacts every morning and does not have any other stools.   The following were reviewed and entered/updated in epic: Past Medical History  Diagnosis Date  . Hyperlipidemia   . Hypertension   . BPH (benign prostatic hypertrophy)   . H/O: CVA (cardiovascular accident)   . MI, old   . SHINGLES 04/07/2009  . Coronary artery disease   . Stroke 2006  . Hiatal hernia     wheelchair bound stroke residual  . History of shingles 04/07/2009    Treated shingles 2010 s/p immunization      . ERECTILE DYSFUNCTION, ORGANIC 05/16/2007   Patient Active Problem List   Diagnosis Date Noted  . CAD (coronary artery disease) 05/23/2008    Priority: High  . Spinal cord infarction 01/05/2007    Priority: High  . Hyperlipidemia 01/05/2007    Priority: Medium  . Essential hypertension 01/05/2007    Priority: Medium  . Eczema 07/10/2014    Priority: Low  . Psoriasis and similar disorders 07/23/2013    Priority: Low  . Vitamin B12 deficiency 10/17/2008    Priority: Low  . GERD 08/24/2007    Priority: Low  . UTI (urinary tract infection) 01/09/2007    Priority: Low   Past Surgical History    Procedure Laterality Date  . Cardiac catheterization    . Coronary angioplasty      Family History  Problem Relation Age of Onset  . Hypertension    . Alcohol abuse    . Hyperlipidemia    . Heart disease Father     Medications- reviewed and updated Current Outpatient Prescriptions  Medication Sig Dispense Refill  . aspirin 81 MG EC tablet Take 1 tablet (81 mg total) by mouth daily. Swallow whole. 30 tablet 12  . Cholecalciferol (VITAMIN D3) 2000 UNITS capsule Take 1 capsule (2,000 Units total) by mouth daily.    . cyanocobalamin ([facility] VITAMIN B-12) 500 MCG tablet PLACE 1 TABLET UNDER TONGUE DAILY 675 tablet 3  . folic acid (FOLVITE) 1 MG tablet TAKE 1 TABLET BY MOUTH DAILY 30 tablet 4  . metoprolol tartrate (LOPRESSOR) 25 MG tablet TAKE HALF TABLET BY MOUTH TWO TIMES A DAY 90 tablet 1  . omeprazole (PRILOSEC) 20 MG capsule Take 1 capsule (20 mg total) by mouth daily. 30 capsule 0  . simvastatin (ZOCOR) 40 MG tablet TAKE 1 TABLET EVERY DAY BY MOUTH 90 tablet 1  . triamcinolone cream (KENALOG) 0.1 % Apply 1 application topically 2 (two) times daily. 454 g 3  . ciprofloxacin (CIPRO) 500 MG tablet Take 1 tablet (500 mg total) by mouth 2 (two) times daily. (Patient not taking: Reported on 07/10/2014) 20 tablet 11   Allergies-reviewed and updated No Known Allergies  History  Social History  . Marital Status: Married    Spouse Name: N/A  . Number of Children: N/A  . Years of Education: N/A   Occupational History  . self employed    Social History Main Topics  . Smoking status: Former Smoker    Quit date: 05/03/1974  . Smokeless tobacco: Former Systems developer  . Alcohol Use: No  . Drug Use: No  . Sexual Activity: Yes   Other Topics Concern  . None   Social History Narrative   Married (wife patient of Dr. Yong Channel). 2 sons. 1 granddaughter (2001).       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 Packs over 1000 eggs a day.       Hobbies: time with wife and working  at [employer].     ROS--See HPI   Objective: BP 120/78 mmHg  Pulse 60  Temp(Src) 98 F (36.7 C)  Wt  Gen: NAD, resting comfortably in wheelchair CV: RRR no murmurs rubs or gallops Lungs: CTAB no crackles, wheeze, rhonchi Abdomen: soft/nontender/nondistended/normal bowel sounds.  Ext: 1+ edema L leg, trace right (chronic) Skin: warm, dry, no rash Neuro: wheelchair bound, no function lower extremities, somewhat controls right arms, full control only L thumb   Assessment/Plan:  CAD (coronary artery disease) asymptomatic but minimal activity. Continue ASA-can only tolerate 3 days a week due to bruising. Continue metoprolol and simvastatin. Not seeing cardiology.   Hyperlipidemia Has been on simvastatin but out for a few weeks. Restart today and repeat lipids 6 weeks from today.    Essential hypertension Controlled on Metoprolol 12.5mg  BID   6 month follow up. I am willing to refill his cipro as has potential for serious UTI. Willing to refill triamcinolone, meclizine for inner ear dizziness issues intermittently.   6 weeks future fasting labs Orders Placed This Encounter  Procedures  . Pneumococcal conjugate vaccine 13-valent  . CBC    [facility]    Standing Status: Future     Number of Occurrences:      Standing Expiration Date: 07/10/2015  . Comprehensive metabolic panel    [place]    Standing Status: Future     Number of Occurrences:      Standing Expiration Date: 07/10/2015    Order Specific Question:  Has the patient fasted?    Answer:  No  . Lipid panel    [facility]    Standing Status: Future     Number of Occurrences:      Standing Expiration Date: 07/10/2015    Order Specific Question:  Has the patient fasted?    Answer:  No  . TSH    [facility]    Standing Status: Future     Number of Occurrences:      Standing Expiration Date: 07/10/2015    Meds ordered this encounter  Medications  . simvastatin (ZOCOR) 40 MG tablet    Sig: TAKE 1 TABLET EVERY DAY BY MOUTH     Dispense:  90 tablet    Refill:  1  . meclizine (ANTIVERT) 25 MG tablet    Sig: Take 1 tablet (25 mg total) by mouth 3 (three) times daily as needed.    Dispense:  30 tablet    Refill:  5

## 2014-07-10 NOTE — Patient Instructions (Signed)
Things look great! Thanks for sharing your story.   Return in 6 weeks for fasting bloodwork  Final pneumonia shot given today

## 2014-07-17 ENCOUNTER — Other Ambulatory Visit: Payer: Self-pay | Admitting: Internal Medicine

## 2014-07-24 ENCOUNTER — Other Ambulatory Visit: Payer: Self-pay | Admitting: Internal Medicine

## 2014-07-30 ENCOUNTER — Other Ambulatory Visit: Payer: Self-pay | Admitting: Family Medicine

## 2014-08-21 ENCOUNTER — Other Ambulatory Visit: Payer: Medicare Other

## 2014-08-23 ENCOUNTER — Other Ambulatory Visit (INDEPENDENT_AMBULATORY_CARE_PROVIDER_SITE_OTHER): Payer: Medicare Other

## 2014-08-23 ENCOUNTER — Other Ambulatory Visit: Payer: Medicare Other

## 2014-08-23 DIAGNOSIS — I10 Essential (primary) hypertension: Secondary | ICD-10-CM | POA: Diagnosis not present

## 2014-08-23 DIAGNOSIS — E785 Hyperlipidemia, unspecified: Secondary | ICD-10-CM | POA: Diagnosis not present

## 2014-08-23 LAB — TSH: TSH: 1.43 u[IU]/mL (ref 0.35–4.50)

## 2014-08-23 LAB — COMPREHENSIVE METABOLIC PANEL
ALT: 15 U/L (ref 0–53)
AST: 15 U/L (ref 0–37)
Albumin: 4 g/dL (ref 3.5–5.2)
Alkaline Phosphatase: 91 U/L (ref 39–117)
BILIRUBIN TOTAL: 0.4 mg/dL (ref 0.2–1.2)
BUN: 16 mg/dL (ref 6–23)
CO2: 30 meq/L (ref 19–32)
Calcium: 9.3 mg/dL (ref 8.4–10.5)
Chloride: 104 mEq/L (ref 96–112)
Creatinine, Ser: 0.81 mg/dL (ref 0.40–1.50)
GFR: 99.62 mL/min (ref >60.00)
Glucose, Bld: 93 mg/dL (ref 70–99)
Potassium: 4.3 mEq/L (ref 3.5–5.1)
SODIUM: 138 meq/L (ref 135–145)
Total Protein: 6.8 g/dL (ref 6.0–8.3)

## 2014-08-23 LAB — CBC
HCT: 39.6 % (ref 39.0–52.0)
Hemoglobin: 13.3 g/dL (ref 13.0–17.0)
MCHC: 33.5 g/dL (ref 30.0–36.0)
MCV: 82.8 fl (ref 78.0–100.0)
Platelets: 260 10*3/uL (ref 150.0–400.0)
RBC: 4.78 Mil/uL (ref 4.22–5.81)
RDW: 13.9 % (ref 11.5–15.5)
WBC: 6.1 10*3/uL (ref 4.0–10.5)

## 2014-08-23 LAB — LIPID PANEL
CHOLESTEROL: 150 mg/dL (ref 0–200)
HDL: 44.5 mg/dL (ref >39.00)
LDL Cholesterol: 86 mg/dL (ref 0–99)
NonHDL: 105.5
Total CHOL/HDL Ratio: 3
Triglycerides: 99 mg/dL (ref 0.0–149.0)
VLDL: 19.8 mg/dL (ref 0.0–40.0)

## 2014-09-02 ENCOUNTER — Other Ambulatory Visit: Payer: Self-pay

## 2014-09-02 MED ORDER — CIPROFLOXACIN HCL 500 MG PO TABS: 500.0000 mg | ORAL_TABLET | Freq: Two times a day (BID) | ORAL | Status: DC

## 2014-09-24 ENCOUNTER — Other Ambulatory Visit: Payer: Self-pay | Admitting: Family Medicine

## 2014-09-24 MED ORDER — METOPROLOL TARTRATE 25 MG PO TABS: ORAL_TABLET | ORAL | Status: DC

## 2014-11-15 ENCOUNTER — Telehealth: Payer: Self-pay | Admitting: Family Medicine

## 2014-11-15 NOTE — Telephone Encounter (Addendum)
Pt needs repairs to wheelchair and needs a prescription for the repairs  Sent to [facility] supply in [place]  Fax  [phone]  Pt aware dr hunter out until Monday 7/18

## 2014-11-19 NOTE — Telephone Encounter (Signed)
Given to [place] to fax-

## 2014-11-19 NOTE — Telephone Encounter (Signed)
rx faxed

## 2014-12-17 ENCOUNTER — Other Ambulatory Visit: Payer: Self-pay | Admitting: Internal Medicine

## 2014-12-27 ENCOUNTER — Other Ambulatory Visit: Payer: Self-pay | Admitting: Family Medicine

## 2015-01-01 ENCOUNTER — Other Ambulatory Visit: Payer: Self-pay | Admitting: Family Medicine

## 2015-01-30 ENCOUNTER — Telehealth: Payer: Self-pay | Admitting: Family Medicine

## 2015-01-30 MED ORDER — CYANOCOBALAMIN 500 MCG PO TABS: ORAL_TABLET | ORAL | Status: DC

## 2015-01-30 NOTE — Telephone Encounter (Signed)
Medication refilled

## 2015-01-30 NOTE — Telephone Encounter (Signed)
[id number] [place], [place] - [place] [phone]  Requesting refill of cyanocobalamin ([facility] VITAMIN B-12) 500 MCG tablet

## 2015-04-03 ENCOUNTER — Other Ambulatory Visit: Payer: Self-pay | Admitting: Internal Medicine

## 2015-04-04 NOTE — Telephone Encounter (Signed)
Denied.  Filled for 1 year on 12/17/2014

## 2015-05-06 ENCOUNTER — Other Ambulatory Visit: Payer: Self-pay

## 2015-05-06 MED ORDER — FOLIC ACID 1 MG PO TABS: 1.0000 mg | ORAL_TABLET | Freq: Every day | ORAL | Status: DC

## 2015-05-07 MED ORDER — FOLIC ACID 1 MG PO TABS: 1.0000 mg | ORAL_TABLET | Freq: Every day | ORAL | Status: DC

## 2015-05-07 NOTE — Addendum Note (Signed)
Addended by: Colleen Can on: 05/07/2015 09:13 AM   Modules accepted: Orders

## 2015-05-07 NOTE — Telephone Encounter (Signed)
Received a fax pt wanted a 90 day supply.  Rx sent for #90 with 0 rf.

## 2015-05-19 ENCOUNTER — Other Ambulatory Visit: Payer: Self-pay | Admitting: *Deleted

## 2015-05-19 MED ORDER — TRIAMCINOLONE ACETONIDE 0.1 % EX CREA: 1.0000 "application " | TOPICAL_CREAM | Freq: Two times a day (BID) | CUTANEOUS | Status: DC

## 2015-05-20 ENCOUNTER — Telehealth: Payer: Self-pay | Admitting: Family Medicine

## 2015-05-20 NOTE — Telephone Encounter (Signed)
Add icd 10 to script G95.11

## 2015-05-20 NOTE — Telephone Encounter (Signed)
Pt wife called to say that they need an order for a wheel chair cushion. The cushion name is called [id number] and they are requesting that the order be sent to [place]

## 2015-05-20 NOTE — Telephone Encounter (Signed)
Rx on your desk to be signed, return to me and I will fax.

## 2015-05-21 NOTE — Telephone Encounter (Signed)
Rx faxed

## 2015-05-23 ENCOUNTER — Telehealth: Payer: Self-pay | Admitting: Family Medicine

## 2015-05-23 NOTE — Telephone Encounter (Signed)
Clarified with pharmacy that this should be one tab daily.

## 2015-05-23 NOTE — Telephone Encounter (Signed)
Pharmacy needs clarification on Vit b12.

## 2015-06-04 ENCOUNTER — Ambulatory Visit (INDEPENDENT_AMBULATORY_CARE_PROVIDER_SITE_OTHER): Payer: Medicare Other | Admitting: Family Medicine

## 2015-06-04 ENCOUNTER — Encounter: Payer: Self-pay | Admitting: Family Medicine

## 2015-06-04 VITALS — BP 120/70 | HR 61 | Temp 98.2°F

## 2015-06-04 DIAGNOSIS — E785 Hyperlipidemia, unspecified: Secondary | ICD-10-CM

## 2015-06-04 DIAGNOSIS — I2510 Atherosclerotic heart disease of native coronary artery without angina pectoris: Secondary | ICD-10-CM

## 2015-06-04 DIAGNOSIS — I10 Essential (primary) hypertension: Secondary | ICD-10-CM | POA: Diagnosis not present

## 2015-06-04 LAB — COMPREHENSIVE METABOLIC PANEL
ALBUMIN: 4 g/dL (ref 3.5–5.2)
ALK PHOS: 90 U/L (ref 39–117)
ALT: 13 U/L (ref 0–53)
AST: 17 U/L (ref 0–37)
BILIRUBIN TOTAL: 0.4 mg/dL (ref 0.2–1.2)
BUN: 25 mg/dL — ABNORMAL HIGH (ref 6–23)
CO2: 26 mEq/L (ref 19–32)
Calcium: 9.2 mg/dL (ref 8.4–10.5)
Chloride: 105 mEq/L (ref 96–112)
Creatinine, Ser: 0.84 mg/dL (ref 0.40–1.50)
GFR: 95.32 mL/min (ref >60.00)
Glucose, Bld: 84 mg/dL (ref 70–99)
POTASSIUM: 3.9 meq/L (ref 3.5–5.1)
Sodium: 141 mEq/L (ref 135–145)
TOTAL PROTEIN: 6.8 g/dL (ref 6.0–8.3)

## 2015-06-04 LAB — CBC
HEMATOCRIT: 40.3 % (ref 39.0–52.0)
HEMOGLOBIN: 13.1 g/dL (ref 13.0–17.0)
MCHC: 32.5 g/dL (ref 30.0–36.0)
MCV: 85.3 fl (ref 78.0–100.0)
PLATELETS: 243 10*3/uL (ref 150.0–400.0)
RBC: 4.73 Mil/uL (ref 4.22–5.81)
RDW: 13.9 % (ref 11.5–15.5)
WBC: 6.5 10*3/uL (ref 4.0–10.5)

## 2015-06-04 LAB — LDL CHOLESTEROL, DIRECT: LDL DIRECT: 77 mg/dL

## 2015-06-04 NOTE — Assessment & Plan Note (Signed)
S: slightly poorly controlled on simvastatin 40mg . No myalgias.  Lab Results  Component Value Date   CHOL 150 08/23/2014   HDL 44.50 08/23/2014   LDLCALC 86 08/23/2014   LDLDIRECT 77.0 06/04/2015   TRIG 99.0 08/23/2014   CHOLHDL 3 08/23/2014   A/P: LDL goal really <70 and patient at 72. He reports he was on atorvastatin when had spinal cord infarction and he is very hesitant to change statin. He would prefer to discuss with cardiology. My thought was crestor 10-20mg  and repeat LDL.

## 2015-06-04 NOTE — Assessment & Plan Note (Signed)
S: controlled. Metoprolol 12.5mg  BID BP Readings from Last 3 Encounters:  06/04/15 120/70  07/10/14 120/78  07/23/13 130/90  A/P:Continue current meds:  No change

## 2015-06-04 NOTE — Assessment & Plan Note (Signed)
S: Patient is compliant with ASA, simvastatin. Metoprolol 25mg  1/2 tablet BID. He is doing very well and Bikes every other day on stationary bike, standing machine daily hoping one day to walk again, swimming once a week, chinese massage every other Wednesday. Asymptomatic with exertional activities with no chest pain or shortness of breath A/P: Patient doing well. Has not seen cardiology since 2007. I believe it is reasonable to get him plugged back in and get their thoughts on repeat stress testing- though asymptomatic would have been at least 10 years since evaluation. See HLD section about LDL goal

## 2015-06-04 NOTE — Progress Notes (Signed)
Garret Reddish, MD  Subjective:  Joseph Hayes is a 75 y.o. year old very pleasant male patient who presents for/with See problem oriented charting ROS- No chest pain or shortness of breath. No headache or blurry vision. Still unable to walk on his own.  Past Medical History-  Patient Active Problem List   Diagnosis Date Noted  . CAD (coronary artery disease) 05/23/2008    Priority: High  . Spinal cord infarction ([place]) 01/05/2007    Priority: High  . Hyperlipidemia 01/05/2007    Priority: Medium  . Essential hypertension 01/05/2007    Priority: Medium  . Eczema 07/10/2014    Priority: Low  . Psoriasis and similar disorders 07/23/2013    Priority: Low  . Vitamin B12 deficiency 10/17/2008    Priority: Low  . GERD 08/24/2007    Priority: Low  . UTI (urinary tract infection) 01/09/2007    Priority: Low    Medications- reviewed and updated Current Outpatient Prescriptions  Medication Sig Dispense Refill  . aspirin 81 MG EC tablet Take 1 tablet (81 mg total) by mouth daily. Swallow whole. 30 tablet 12  . Cholecalciferol (VITAMIN D3) 2000 UNITS capsule Take 1 capsule (2,000 Units total) by mouth daily.    . ciprofloxacin (CIPRO) 500 MG tablet Take 1 tablet (500 mg total) by mouth 2 (two) times daily. 20 tablet 11  . cyanocobalamin ([facility]S VITAMIN B-12) 500 MCG tablet PLACE 1 TABLET UNDER TONGUE DAILY 123XX123 tablet 3  . folic acid (FOLVITE) 1 MG tablet Take 1 tablet (1 mg total) by mouth daily. 90 tablet 0  . metoprolol tartrate (LOPRESSOR) 25 MG tablet TAKE 1/2 TABLET BY MOUTH TWICE A DAY 90 tablet 3  . omeprazole (PRILOSEC) 20 MG capsule Take 1 capsule (20 mg total) by mouth daily. 30 capsule 0  . simvastatin (ZOCOR) 40 MG tablet TAKE 1 TABLET EVERY DAY BY MOUTH 90 tablet 3  . triamcinolone cream (KENALOG) 0.1 % Apply 1 application topically 2 (two) times daily. 454 g 2  . meclizine (ANTIVERT) 25 MG tablet Take 1 tablet (25 mg total) by mouth 3 (three) times daily as needed. (Patient  not taking: Reported on 06/04/2015) 30 tablet 5   No current facility-administered medications for this visit.    Objective: BP 120/70 mmHg  Pulse 61  Temp(Src) 98.2 F (36.8 C)  Wt  Gen: NAD, resting comfortably in wheelchair CV: RRR no murmurs rubs or gallops Lungs: CTAB no crackles, wheeze, rhonchi Abdomen: soft/nontender/nondistended/normal bowel sounds.  Ext: 1+ edema L leg, trace right - no change from prior Skin: warm, dry, no rash Neuro: wheelchair bound, no function lower extremities, somewhat controls right arm, full control only L thumb   Assessment/Plan:  CAD (coronary artery disease) S: Patient is compliant with ASA, simvastatin. Metoprolol 25mg  1/2 tablet BID. He is doing very well and Bikes every other day on stationary bike, standing machine daily hoping one day to walk again, swimming once a week, chinese massage every other Wednesday. Asymptomatic with exertional activities with no chest pain or shortness of breath A/P: Patient doing well. Has not seen cardiology since 2007. I believe it is reasonable to get him plugged back in and get their thoughts on repeat stress testing- though asymptomatic would have been at least 10 years since evaluation. See HLD section about LDL goal   Hyperlipidemia S: slightly poorly controlled on simvastatin 40mg . No myalgias.  Lab Results  Component Value Date   CHOL 150 08/23/2014   HDL 44.50 08/23/2014  LDLCALC 86 08/23/2014   LDLDIRECT 77.0 06/04/2015   TRIG 99.0 08/23/2014   CHOLHDL 3 08/23/2014   A/P: LDL goal really <70 and patient at 70. He reports he was on atorvastatin when had spinal cord infarction and he is very hesitant to change statin. He would prefer to discuss with cardiology. My thought was crestor 10-20mg  and repeat LDL.     Essential hypertension S: controlled. Metoprolol 12.5mg  BID BP Readings from Last 3 Encounters:  06/04/15 120/70  07/10/14 120/78  07/23/13 130/90  A/P:Continue current meds:  No  change    Plan is if sees cardiology every year, to see me at 6 month intervals in between. If seeing cardiology less frequent- see me every 6 months. Return precautions advised.   Orders Placed This Encounter  Procedures  . CBC    [place]  . Comprehensive metabolic panel    [place]  . LDL cholesterol, direct    [place]  . Ambulatory referral to Cardiology    Referral Priority:  Routine    Referral Type:  Consultation    Referral Reason:  Specialty Services Required    Requested Specialty:  Cardiology    Number of Visits Requested:  1

## 2015-06-04 NOTE — Patient Instructions (Signed)
Update labs today  We will call you within a week about your referral to cardiology. If you do not hear within 2 weeks, give [place] a call.   Considered changing cholesterol medicine but hesitant with history on the atorvastatin/lipitor

## 2015-06-05 ENCOUNTER — Ambulatory Visit: Payer: Medicare Other | Admitting: Family Medicine

## 2015-06-13 ENCOUNTER — Encounter: Payer: Self-pay | Admitting: *Deleted

## 2015-06-23 ENCOUNTER — Telehealth: Payer: Self-pay | Admitting: Family Medicine

## 2015-06-23 NOTE — Telephone Encounter (Signed)
Thanks for update- at follow up will ask patient again- he fortunately was asymptomatic

## 2015-06-23 NOTE — Telephone Encounter (Signed)
From: Rosalyn Gess    Sent: 06/13/2015  12:05 PM      To: Marin Olp, MD  Hello,     AMB REFERRAL TO CARDIOLOGY     We are informing your office that after receiving the referral for this patient, we attempted to contact this patient three times to schedule a consult. The patient has yet to contact our office. We will send a letter as a last attempt to contact the patient; we are also removing the patient from our work queue. Thank you and have a great day.        Thank you        [place] Scheduling team

## 2015-06-23 NOTE — Telephone Encounter (Signed)
FYI

## 2015-07-16 ENCOUNTER — Telehealth: Payer: Self-pay | Admitting: Family Medicine

## 2015-07-16 NOTE — Telephone Encounter (Addendum)
Wife states they have been getting pt's in and out cath supplies from [facility],. However pt has changed insurance companies to [facility], and [employer] is not in their network. They will need to get through [place]. Denzil Hughes states they are in with [facility], but Dr Yong Channel is not registered with [facility].  I will call [facility] in the morning.  [facility]    [phone]

## 2015-07-17 NOTE — Telephone Encounter (Signed)
I called [facility] to clarify information provided by pt. Dr Yong Channel is [place] certified, they had the wrong dr in the system for pt.  Denzil Hughes is going to proceed with pt's order, and fax to Dr Yong Channel today for signature.

## 2015-07-17 NOTE — Telephone Encounter (Signed)
Noted  

## 2015-11-11 ENCOUNTER — Telehealth: Payer: Self-pay

## 2015-11-12 ENCOUNTER — Other Ambulatory Visit: Payer: Self-pay

## 2015-11-12 MED ORDER — CIPROFLOXACIN HCL 500 MG PO TABS: 500.0000 mg | ORAL_TABLET | Freq: Two times a day (BID) | ORAL | Status: DC

## 2015-11-12 NOTE — Telephone Encounter (Signed)
This was not from most recent note but  "I am willing to refill his cipro as has potential for serious UTI. " This is due to spinal cord infarction- would have to cath him for specimen and he is pretty aware of his symptoms from what I remember- may refill

## 2015-11-12 NOTE — Telephone Encounter (Signed)
Called patient and left a voicemail message to return phone call. Prescription sent to pharmacy as requested

## 2015-11-13 NOTE — Telephone Encounter (Signed)
Spoke with patient who is aware that prescription was sent to pharmacy

## 2015-12-04 ENCOUNTER — Other Ambulatory Visit: Payer: Self-pay

## 2015-12-04 MED ORDER — FOLIC ACID 1 MG PO TABS: 1.0000 mg | ORAL_TABLET | Freq: Every day | ORAL | 3 refills | Status: DC

## 2015-12-14 ENCOUNTER — Other Ambulatory Visit: Payer: Self-pay | Admitting: Family Medicine

## 2015-12-23 ENCOUNTER — Other Ambulatory Visit: Payer: Self-pay | Admitting: Family Medicine

## 2015-12-25 ENCOUNTER — Other Ambulatory Visit: Payer: Self-pay | Admitting: Family Medicine

## 2016-03-18 ENCOUNTER — Other Ambulatory Visit: Payer: Self-pay | Admitting: Family Medicine

## 2016-04-14 DIAGNOSIS — R339 Retention of urine, unspecified: Secondary | ICD-10-CM | POA: Diagnosis not present

## 2016-04-14 DIAGNOSIS — Z87440 Personal history of urinary (tract) infections: Secondary | ICD-10-CM | POA: Diagnosis not present

## 2016-06-04 ENCOUNTER — Telehealth: Payer: Self-pay | Admitting: Family Medicine

## 2016-06-04 NOTE — Telephone Encounter (Signed)
Prescription faxed as requested.

## 2016-06-04 NOTE — Telephone Encounter (Signed)
Pt needs new battery for his wheel chair. They need  prescription faxed to  [facility]  fax:  [phone] Needs to states  "needs wheel chair repair"

## 2016-09-13 DIAGNOSIS — R339 Retention of urine, unspecified: Secondary | ICD-10-CM | POA: Diagnosis not present

## 2016-09-13 DIAGNOSIS — Z87440 Personal history of urinary (tract) infections: Secondary | ICD-10-CM | POA: Diagnosis not present

## 2016-11-21 ENCOUNTER — Other Ambulatory Visit: Payer: Self-pay | Admitting: Family Medicine

## 2016-11-24 NOTE — Telephone Encounter (Signed)
Yes thanks, may give 1 fill with no refills. He needs a regular office visit- almost 18 months since last seen

## 2016-11-30 ENCOUNTER — Other Ambulatory Visit: Payer: Self-pay | Admitting: Family Medicine

## 2016-12-25 ENCOUNTER — Other Ambulatory Visit: Payer: Self-pay | Admitting: Family Medicine

## 2017-01-06 ENCOUNTER — Other Ambulatory Visit: Payer: Self-pay | Admitting: Family Medicine

## 2017-02-03 DIAGNOSIS — R339 Retention of urine, unspecified: Secondary | ICD-10-CM | POA: Diagnosis not present

## 2017-02-03 DIAGNOSIS — Z87440 Personal history of urinary (tract) infections: Secondary | ICD-10-CM | POA: Diagnosis not present

## 2017-03-01 ENCOUNTER — Ambulatory Visit (INDEPENDENT_AMBULATORY_CARE_PROVIDER_SITE_OTHER): Payer: Medicare Other | Admitting: Family Medicine

## 2017-03-01 ENCOUNTER — Encounter: Payer: Self-pay | Admitting: Family Medicine

## 2017-03-01 VITALS — BP 122/74 | HR 57 | Temp 97.6°F

## 2017-03-01 DIAGNOSIS — Z23 Encounter for immunization: Secondary | ICD-10-CM | POA: Diagnosis not present

## 2017-03-01 DIAGNOSIS — E663 Overweight: Secondary | ICD-10-CM | POA: Diagnosis not present

## 2017-03-01 DIAGNOSIS — Z0000 Encounter for general adult medical examination without abnormal findings: Secondary | ICD-10-CM | POA: Diagnosis not present

## 2017-03-01 DIAGNOSIS — G9511 Acute infarction of spinal cord (embolic) (nonembolic): Secondary | ICD-10-CM

## 2017-03-01 DIAGNOSIS — I2510 Atherosclerotic heart disease of native coronary artery without angina pectoris: Secondary | ICD-10-CM | POA: Diagnosis not present

## 2017-03-01 DIAGNOSIS — E538 Deficiency of other specified B group vitamins: Secondary | ICD-10-CM | POA: Diagnosis not present

## 2017-03-01 DIAGNOSIS — I10 Essential (primary) hypertension: Secondary | ICD-10-CM

## 2017-03-01 DIAGNOSIS — E785 Hyperlipidemia, unspecified: Secondary | ICD-10-CM

## 2017-03-01 DIAGNOSIS — R7989 Other specified abnormal findings of blood chemistry: Secondary | ICD-10-CM | POA: Diagnosis not present

## 2017-03-01 DIAGNOSIS — L309 Dermatitis, unspecified: Secondary | ICD-10-CM

## 2017-03-01 DIAGNOSIS — N3000 Acute cystitis without hematuria: Secondary | ICD-10-CM

## 2017-03-01 LAB — HEMOGLOBIN A1C: HEMOGLOBIN A1C: 5.9 % (ref 4.6–6.5)

## 2017-03-01 LAB — COMPREHENSIVE METABOLIC PANEL
ALBUMIN: 4.1 g/dL (ref 3.5–5.2)
ALT: 19 U/L (ref 0–53)
AST: 17 U/L (ref 0–37)
Alkaline Phosphatase: 74 U/L (ref 39–117)
BUN: 16 mg/dL (ref 6–23)
CHLORIDE: 102 meq/L (ref 96–112)
CO2: 31 mEq/L (ref 19–32)
Calcium: 9.5 mg/dL (ref 8.4–10.5)
Creatinine, Ser: 0.76 mg/dL (ref 0.40–1.50)
GFR: 106.48 mL/min (ref >60.00)
GLUCOSE: 91 mg/dL (ref 70–99)
POTASSIUM: 4.8 meq/L (ref 3.5–5.1)
SODIUM: 140 meq/L (ref 135–145)
Total Bilirubin: 0.5 mg/dL (ref 0.2–1.2)
Total Protein: 6.4 g/dL (ref 6.0–8.3)

## 2017-03-01 LAB — CBC
HEMATOCRIT: 41.2 % (ref 39.0–52.0)
Hemoglobin: 13.6 g/dL (ref 13.0–17.0)
MCHC: 33 g/dL (ref 30.0–36.0)
MCV: 88 fl (ref 78.0–100.0)
Platelets: 232 10*3/uL (ref 150.0–400.0)
RBC: 4.69 Mil/uL (ref 4.22–5.81)
RDW: 14.1 % (ref 11.5–15.5)
WBC: 4.9 10*3/uL (ref 4.0–10.5)

## 2017-03-01 LAB — LIPID PANEL
CHOLESTEROL: 150 mg/dL (ref 0–200)
HDL: 45.4 mg/dL (ref >39.00)
LDL CALC: 76 mg/dL (ref 0–99)
NonHDL: 104.64
Total CHOL/HDL Ratio: 3
Triglycerides: 145 mg/dL (ref 0.0–149.0)
VLDL: 29 mg/dL (ref 0.0–40.0)

## 2017-03-01 LAB — C-REACTIVE PROTEIN: CRP: 0.1 mg/dL — AB (ref 0.5–20.0)

## 2017-03-01 LAB — VITAMIN D 25 HYDROXY (VIT D DEFICIENCY, FRACTURES): VITD: 30.83 ng/mL (ref 30.00–100.00)

## 2017-03-01 LAB — VITAMIN B12: Vitamin B-12: 620 pg/mL (ref 211–911)

## 2017-03-01 MED ORDER — TRIAMCINOLONE ACETONIDE 0.1 % EX CREA: 1.0000 "application " | TOPICAL_CREAM | Freq: Two times a day (BID) | CUTANEOUS | 1 refills | Status: DC

## 2017-03-01 NOTE — Progress Notes (Signed)
Phone: [phone]  Subjective:  Patient presents today for their annual physical. Chief complaint-noted.   See problem oriented charting- ROS- full  review of systems was completed and negative except for: known paraplegia, intermittent dysuria. No recent fever. No chest pain or shortness of breath but activity limited  The following were reviewed and entered/updated in epic: Past Medical History:  Diagnosis Date  . BPH (benign prostatic hypertrophy)   . Coronary artery disease   . ERECTILE DYSFUNCTION, ORGANIC 05/16/2007  . H/O: CVA (cardiovascular accident)   . Hiatal hernia    wheelchair bound stroke residual  . History of shingles 04/07/2009   Treated shingles 2010 s/p immunization      . Hyperlipidemia   . Hypertension   . MI, old   . SHINGLES 04/07/2009  . Stroke [facility]) 2006   Patient Active Problem List   Diagnosis Date Noted  . CAD (coronary artery disease) 05/23/2008    Priority: High  . Spinal cord infarction ([place]) 01/05/2007    Priority: High  . Hyperlipidemia 01/05/2007    Priority: Medium  . Essential hypertension 01/05/2007    Priority: Medium  . Eczema 07/10/2014    Priority: Low  . Psoriasis and similar disorders 07/23/2013    Priority: Low  . Vitamin B12 deficiency 10/17/2008    Priority: Low  . GERD 08/24/2007    Priority: Low  . UTI (urinary tract infection) 01/09/2007    Priority: Low   Past Surgical History:  Procedure Laterality Date  . CARDIAC CATHETERIZATION    . CORONARY ANGIOPLASTY      Family History  Problem Relation Age of Onset  . Hypertension Unknown   . Alcohol abuse Unknown   . Hyperlipidemia Unknown   . Heart disease Father     Medications- reviewed and updated Current Outpatient Prescriptions  Medication Sig Dispense Refill  . aspirin 81 MG EC tablet Take 1 tablet (81 mg total) by mouth daily. Swallow whole. 30 tablet 12  . Cholecalciferol (VITAMIN D3) 2000 UNITS capsule Take 1 capsule (2,000 Units total) by mouth  daily.    . ciprofloxacin (CIPRO) 500 MG tablet TAKE 1 TABLET (500 MG TOTAL) BY MOUTH 2 (TWO) TIMES DAILY. 60 tablet 0  . CVS VITAMIN B-12 500 MCG tablet TAKE 1 TABLET BY MOUTH EVERY DAY 732 tablet 2  . folic acid (FOLVITE) 1 MG tablet Take 1 tablet (1 mg total) by mouth daily. 90 tablet 3  . folic acid (FOLVITE) 1 MG tablet TAKE 1 TABLET (1 MG TOTAL) BY MOUTH DAILY. 30 tablet 5  . meclizine (ANTIVERT) 25 MG tablet Take 1 tablet (25 mg total) by mouth 3 (three) times daily as needed. (Patient not taking: Reported on 06/04/2015) 30 tablet 5  . metoprolol tartrate (LOPRESSOR) 25 MG tablet TAKE 1/2 TABLET BY MOUTH TWICE A DAY 90 tablet 3  . omeprazole (PRILOSEC) 20 MG capsule Take 1 capsule (20 mg total) by mouth daily. 30 capsule 0  . simvastatin (ZOCOR) 40 MG tablet TAKE 1 TABLET EVERY DAY BY MOUTH 90 tablet 0  . triamcinolone cream (KENALOG) 0.1 % Apply 1 application topically 2 (two) times daily. 454 g 2   No current facility-administered medications for this visit.     Allergies-reviewed and updated No Known Allergies  Social History   Social History  . Marital status: Married    Spouse name: N/A  . Number of children: N/A  . Years of education: N/A   Occupational History  . self employed  Social History Main Topics  . Smoking status: Former Smoker    Quit date: 05/03/1974  . Smokeless tobacco: Former Systems developer  . Alcohol use No  . Drug use: No  . Sexual activity: Yes   Other Topics Concern  . Not on file   Social History Narrative   Married (wife patient of Dr. Yong Channel). 2 sons. 1 granddaughter (2001).       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 Packs over 1000 eggs a day.       Hobbies: time with wife and working at [employer].     Objective: BP 122/74 (BP Location: Left Arm, Patient Position: Sitting, Cuff Size: Large)   Pulse (!) 57   Temp 97.6 F (36.4 C) (Oral)   SpO2 97%  Gen: NAD, resting comfortably HEENT: Mucous membranes are moist.  Oropharynx normal Neck: no obvious thyromegaly CV: RRR no murmurs rubs or gallops. Not bradycardic on my exam Lungs: CTAB no crackles, wheeze, rhonchi Abdomen: soft/nontender/nondistended/normal bowel sounds. No rebound or guarding.  Ext: no edema Skin: warm, dry Neuro: paraplegia noted- wheelchair bound. 5/5 strength flexion of arms. 3/5 strenght extension right arm, 4/5 extension left arm. Unable to form fist in both hands. PERRLA   Assessment/Plan:  76 y.o. male presenting for annual physical.  Health Maintenance counseling: 1. Anticipatory guidance: Patient counseled regarding regular dental exams at least every year- advised 6 months, eye exams rarely goes- advised to go see, wearing seatbelts.  2. Risk factor reduction:  Advised patient of need for regular exercise and diet rich and fruits and vegetables to reduce risk of heart attack and stroke. Exercise- last visit was biking every other day on stationary bike, doing standing machine daily, swimming once a week, chinese massage every other wednesday. Biking 2 x a week now- down some. Quit doing his massage- provider not coming to [place] anymore. Diet-balanced diet.  3. Immunizations/screenings/ancillary studies- flu shot given today  Immunization History  Administered Date(s) Administered  . Influenza Split 01/14/2011, 07/11/2012  . Influenza Whole 02/21/2008, 02/18/2009, 02/16/2010  . Influenza, High Dose Seasonal PF 03/05/2013  . Pneumococcal Conjugate-13 07/10/2014  . Pneumococcal Polysaccharide-23 10/01/2004, 07/11/2012  . Td 04/22/1993, 10/02/2012  . Zoster 03/05/2013    4. Prostate cancer screening- passed age based screening.    5. Colon cancer screening - will get cologuard- avoid when hemorrhoids flared up 6. Skin cancer screening- [place] derm- going to follow up. A few AKs (we do not have our cryotherapy available)    Status of chronic or acute concerns   GERD- controlled on prilosec 20mg  daily  Overweight- update  a1c  CAD (coronary artery disease) CAD- last visit advised follow up with cardiology (06/04/15) as had not been seen in 23 years. Marland Kitchen He is compliant with aspirin every other day, simvastatin, metoprolol 12.5mg  BID. Remains active as above. Refer back to cardiology- did not follow through with referral last visit  Spinal cord infarction C4-T1 spinal infarction-wheelchair bound- spinal stroke.  Minimal nipple down sensation- states has improved slightly and is even ticklish In feet, no strength in legs. L arm stronger than R-thumb on left only function.   Unable to change to history of spinal cord infarction- do not see in ICD10  Hyperlipidemia HLD- on simvastatin 40mg . LDL goal 70- update lipids. They decline changes to statin as long as LDL under 100 but will at least look up rosuvastatin 10 or 20mg . They do request CRP and particle size- did agree to CRP if will  encourage cardiology follow up   Essential hypertension HTN- controlled on metoprolol 12.5mg  BID  UTI (urinary tract infection) In and out cath after spinal cord injury-4x a day. Cipro on hand in case feels feverish with cloudy pee- early to prevent severe infection. About once a month takes for 2-3 days.   Eczema  Eczema- prn triamcinolone. Advised regular emmollient use first- they have been using mainly triamcinolone- advised only to use for flares  Low vitamin D level Low vitamin D in past- stopped taking for some reason- requests update   No future appointments. No Follow-up on file.  Orders Placed This Encounter  Procedures  . CBC    [place]  . Comprehensive metabolic panel    [place]    Order Specific Question:   Has the patient fasted?    Answer:   No  . Lipid panel    [place]    Order Specific Question:   Has the patient fasted?    Answer:   No  . Vitamin B12  . VITAMIN D 25 Hydroxy (Vit-D Deficiency, Fractures)    [place]  . Hemoglobin A1c    [place]  . C-reactive protein  . Ambulatory referral to  Cardiology    Referral Priority:   Routine    Referral Type:   Consultation    Referral Reason:   Specialty Services Required    Requested Specialty:   Cardiology    Number of Visits Requested:   1    Meds ordered this encounter  Medications  . triamcinolone cream (KENALOG) 0.1 %    Sig: Apply 1 application topically 2 (two) times daily. For 7-10 days max with flare up    Dispense:  454 g    Refill:  1    Return precautions advised.  Garret Reddish, MD

## 2017-03-01 NOTE — Assessment & Plan Note (Signed)
In and out cath after spinal cord injury-4x a day. Cipro on hand in case feels feverish with cloudy pee- early to prevent severe infection. About once a month takes for 2-3 days.

## 2017-03-01 NOTE — Addendum Note (Signed)
Addended by: Mariam Dollar, Roselyn Reef M on: 03/01/2017 11:04 AM   Modules accepted: Orders

## 2017-03-01 NOTE — Assessment & Plan Note (Signed)
HLD- on simvastatin 40mg . LDL goal 70- update lipids. They decline changes to statin as long as LDL under 100 but will at least look up rosuvastatin 10 or 20mg . They do request CRP and particle size- did agree to CRP if will encourage cardiology follow up

## 2017-03-01 NOTE — Assessment & Plan Note (Signed)
  Eczema- prn triamcinolone. Advised regular emmollient use first- they have been using mainly triamcinolone- advised only to use for flares

## 2017-03-01 NOTE — Assessment & Plan Note (Addendum)
C4-T1 spinal infarction-wheelchair bound- spinal stroke.  Minimal nipple down sensation- states has improved slightly and is even ticklish In feet, no strength in legs. L arm stronger than R-thumb on left only function.   Unable to change to history of spinal cord infarction- do not see in ICD10

## 2017-03-01 NOTE — Assessment & Plan Note (Signed)
HTN- controlled on metoprolol 12.5mg  BID

## 2017-03-01 NOTE — Patient Instructions (Addendum)
Flu shot today  Roselyn Reef will set you up with cologuard  Read up on rosuvastatin/crestor 10mg  or 20mg . Let me know if willing to trial change. I would definitely change if LDL above 100 (prefer under 70) but you have been around 80  Also advise AWV with [place]

## 2017-03-01 NOTE — Assessment & Plan Note (Signed)
CAD- last visit advised follow up with cardiology (06/04/15) as had not been seen in 10 years. Marland Kitchen He is compliant with aspirin every other day, simvastatin, metoprolol 12.5mg  BID. Remains active as above. Refer back to cardiology- did not follow through with referral last visit

## 2017-03-01 NOTE — Assessment & Plan Note (Signed)
Low vitamin D in past- stopped taking for some reason- requests update

## 2017-03-04 ENCOUNTER — Telehealth: Payer: Self-pay | Admitting: Family Medicine

## 2017-03-04 NOTE — Telephone Encounter (Signed)
Phone call was regarding lab results. Patient was informed of results

## 2017-03-04 NOTE — Telephone Encounter (Signed)
Patient returning missed phone call. Please call patient and advise. OK to leave detailed message.

## 2017-03-09 ENCOUNTER — Other Ambulatory Visit: Payer: Self-pay

## 2017-03-09 MED ORDER — FOLIC ACID 1 MG PO TABS: 1.0000 mg | ORAL_TABLET | Freq: Every day | ORAL | 5 refills | Status: DC

## 2017-03-10 ENCOUNTER — Encounter: Payer: Self-pay | Admitting: Family Medicine

## 2017-03-10 ENCOUNTER — Ambulatory Visit: Payer: Medicare Other | Admitting: Family Medicine

## 2017-03-10 VITALS — BP 108/66 | HR 96 | Temp 100.4°F

## 2017-03-10 DIAGNOSIS — R8290 Unspecified abnormal findings in urine: Secondary | ICD-10-CM | POA: Diagnosis not present

## 2017-03-10 DIAGNOSIS — R300 Dysuria: Secondary | ICD-10-CM | POA: Diagnosis not present

## 2017-03-10 LAB — POC URINALSYSI DIPSTICK (AUTOMATED)
BILIRUBIN UA: NEGATIVE
Blood, UA: NEGATIVE
GLUCOSE UA: NEGATIVE
KETONES UA: NEGATIVE
NITRITE UA: POSITIVE
PH UA: 6 (ref 5.0–8.0)
Protein, UA: NEGATIVE
Spec Grav, UA: >=1.03 — AB (ref 1.010–1.025)
Urobilinogen, UA: 0.2 E.U./dL

## 2017-03-10 MED ORDER — CEFTRIAXONE SODIUM 1 G IJ SOLR
1.0000 g | Freq: Once | INTRAMUSCULAR | Status: AC
  Administered 2017-03-10: 1 g via INTRAMUSCULAR

## 2017-03-10 NOTE — Patient Instructions (Addendum)
Ceftriaxone 1g today. Schedule in same day slot for 3 pm tomorrow.  Give another shot tomorrow and decide on antibiotic over weekend from there. I may call you before then with another antibiotic to pick up  Get thermometer.  Await urine culture. For worsening symptoms- fever continues, feels more ill- need to go to the emergency room

## 2017-03-10 NOTE — Progress Notes (Signed)
Subjective:  Joseph Hayes is a 76 y.o. year old very pleasant male patient who presents for/with See problem oriented charting ROS-complains of dysuria, mild fatigue, cloudy urine.  Increased odor to urine.  Subjective fevers  Past Medical History-  Patient Active Problem List   Diagnosis Date Noted  . CAD (coronary artery disease) 05/23/2008    Priority: High  . Spinal cord infarction ([place]) 01/05/2007    Priority: High  . Hyperlipidemia 01/05/2007    Priority: Medium  . Essential hypertension 01/05/2007    Priority: Medium  . Eczema 07/10/2014    Priority: Low  . Psoriasis and similar disorders 07/23/2013    Priority: Low  . Vitamin B12 deficiency 10/17/2008    Priority: Low  . GERD 08/24/2007    Priority: Low  . UTI (urinary tract infection) 01/09/2007    Priority: Low  . Low vitamin D level 03/01/2017    Medications- reviewed and updated Current Outpatient Medications  Medication Sig Dispense Refill  . aspirin 81 MG EC tablet Take 1 tablet (81 mg total) by mouth daily. Swallow whole. (Patient taking differently: Take 81 mg by mouth every other day. Swallow whole.) 30 tablet 12  . Cholecalciferol (VITAMIN D3) 2000 UNITS capsule Take 1 capsule (2,000 Units total) by mouth daily.    . ciprofloxacin (CIPRO) 500 MG tablet TAKE 1 TABLET (500 MG TOTAL) BY MOUTH 2 (TWO) TIMES DAILY. 60 tablet 0  . CVS VITAMIN B-12 500 MCG tablet TAKE 1 TABLET BY MOUTH EVERY DAY 846 tablet 2  . folic acid (FOLVITE) 1 MG tablet Take 1 tablet (1 mg total) daily by mouth. 30 tablet 5  . meclizine (ANTIVERT) 25 MG tablet Take 1 tablet (25 mg total) by mouth 3 (three) times daily as needed. 30 tablet 5  . metoprolol tartrate (LOPRESSOR) 25 MG tablet TAKE 1/2 TABLET BY MOUTH TWICE A DAY 90 tablet 3  . omeprazole (PRILOSEC) 20 MG capsule Take 1 capsule (20 mg total) by mouth daily. 30 capsule 0  . simvastatin (ZOCOR) 40 MG tablet TAKE 1 TABLET EVERY DAY BY MOUTH 90 tablet 0  . triamcinolone cream  (KENALOG) 0.1 % Apply 1 application topically 2 (two) times daily. For 7-10 days max with flare up 454 g 1   No current facility-administered medications for this visit.     Objective: BP 108/66 (BP Location: Left Arm, Patient Position: Sitting, Cuff Size: Large)   Pulse 96   Temp (!) 100.4 F (38 C) (Oral)   SpO2 92%  Gen: NAD, resting comfortably in wheelchair-similar appearance to last week visit Mucous membranes are moist. CV: RRR no murmurs rubs or gallops Lungs: CTAB no crackles, wheeze, rhonchi Abdomen: soft/nontender/nondistended/normal bowel sounds. No rebound or guarding.  Ext: no edema Skin: warm, dry Rectal: Non-boggy prostate.  Nontender prostate  Results for orders placed or performed in visit on 03/10/17 (from the past 24 hour(s))  POCT Urinalysis Dipstick (Automated)     Status: Abnormal   Collection Time: 03/10/17  4:03 PM  Result Value Ref Range   Color, UA Yellow    Clarity, UA Cloudy    Glucose, UA Negative    Bilirubin, UA Negative    Ketones, UA Negative    Spec Grav, UA >=1.030 (A) 1.010 - 1.025   Blood, UA Negative    pH, UA 6.0 5.0 - 8.0   Protein, UA Negative    Urobilinogen, UA 0.2 0.2 or 1.0 E.U./dL   Nitrite, UA Positive    Leukocytes, UA Small (  1+) (A) Negative     Assessment/Plan:  Dysuria - Plan: CBC with Differential/Platelet, Basic metabolic panel, Gram stain, cefTRIAXone (ROCEPHIN) injection 1 g  Cloudy urine - Plan: POCT Urinalysis Dipstick (Automated), Urine Culture, Urine Culture S: symptoms started on Monday and took 1 cipro that night then twice a day through today. Symptoms include, dysuria, smell to urine. Subjective fever, cloudy urine. Feels somewhat tired but not severe. No lightheadedness or dizziness.   Patient with history of urosepsis and hospitalized 2014- appeared to be pan sensitive enterococcus.  A/P: 76 year old paraplegic due to spinal cord infarct with history of recurrent UTI and even hospitalized in the past for  urosepsis presenting with dysuria and elevated temperature despite 4 days of cipro (has at home for situations like this).  Patient and wife came here and hopes to avoid being hospitalized if at all possible.  We did discuss with elevated temperature (likely higher rectal), he may also have an elevated white count and meet sepsis criteria. Not prostatitis on exam but this could potentially be pyelonephritis. Failing outpatient cipro treatment 500mg  BID for 4 days.  We will give him ceftriaxone 1 g today and follow-up at 3 PM tomorrow for repeat injection as well as reevaluation.  They would prefer to try this over being hospitalized but we did discuss the risks of this and if he decompensates he should be transported to the hospital emergently by EMS.  Family and patient agrees.  Get Gram stain with history of enterococcus.  If patient is improving tomorrow, consider antibiotics over the weekend such as Keflex or potentially Omnicef.  If concern based off of Gram stain for enterococcus potentially start Augmentin.  Await urine culture and discussed this likely would not be back until Saturday. Stop cipro   Needs to be 03/11/17 at 3 pm- will discuss with staff Future Appointments  Date Time Provider [place]  03/15/2017  3:30 PM Marin Olp, MD [facility] None  08/30/2017  9:45 AM Yong Channel Brayton Mars, MD [facility] None   Orders Placed This Encounter  Procedures  . Urine Culture    Standing Status:   Future    Number of Occurrences:   1    Standing Expiration Date:   03/10/2018  . Gram stain  . CBC with Differential/Platelet  . Basic metabolic panel      . POCT Urinalysis Dipstick (Automated)    Meds ordered this encounter  Medications  . cefTRIAXone (ROCEPHIN) injection 1 g   The duration of face-to-face time during this visit was greater than 25 minutes. Greater than 50% of this time was spent in counseling, explanation of diagnosis, planning of further management, and/or  coordination of care including discussion of potential outpatient vs. Inpatient treatment, risks and benefits of both, discussing antibiotic concerns.    Return precautions advised.  Garret Reddish, MD

## 2017-03-11 ENCOUNTER — Ambulatory Visit: Payer: Medicare Other | Admitting: Family Medicine

## 2017-03-11 ENCOUNTER — Telehealth: Payer: Self-pay | Admitting: Radiology

## 2017-03-11 ENCOUNTER — Encounter: Payer: Self-pay | Admitting: Family Medicine

## 2017-03-11 VITALS — BP 132/78 | HR 71 | Temp 98.3°F

## 2017-03-11 DIAGNOSIS — N3000 Acute cystitis without hematuria: Secondary | ICD-10-CM

## 2017-03-11 DIAGNOSIS — N390 Urinary tract infection, site not specified: Secondary | ICD-10-CM

## 2017-03-11 LAB — BASIC METABOLIC PANEL
BUN: 27 mg/dL — ABNORMAL HIGH (ref 6–23)
CHLORIDE: 101 meq/L (ref 96–112)
CO2: 25 meq/L (ref 19–32)
CREATININE: 0.9 mg/dL (ref 0.40–1.50)
Calcium: 9.4 mg/dL (ref 8.4–10.5)
GFR: 87.6 mL/min (ref >60.00)
Glucose, Bld: 103 mg/dL — ABNORMAL HIGH (ref 70–99)
POTASSIUM: 4 meq/L (ref 3.5–5.1)
Sodium: 137 mEq/L (ref 135–145)

## 2017-03-11 LAB — CBC WITH DIFFERENTIAL/PLATELET
BASOS ABS: 0.1 10*3/uL (ref 0.0–0.1)
Basophils Relative: 0.7 % (ref 0.0–3.0)
EOS ABS: 0.1 10*3/uL (ref 0.0–0.7)
Eosinophils Relative: 0.4 % (ref 0.0–5.0)
HEMATOCRIT: 41.3 % (ref 39.0–52.0)
Hemoglobin: 13.5 g/dL (ref 13.0–17.0)
LYMPHS ABS: 0.9 10*3/uL (ref 0.7–4.0)
LYMPHS PCT: 5.9 % — AB (ref 12.0–46.0)
MCHC: 32.7 g/dL (ref 30.0–36.0)
MCV: 88.9 fl (ref 78.0–100.0)
MONOS PCT: 6.6 % (ref 3.0–12.0)
Monocytes Absolute: 1 10*3/uL (ref 0.1–1.0)
Neutro Abs: 13.1 10*3/uL — ABNORMAL HIGH (ref 1.4–7.7)
PLATELETS: 252 10*3/uL (ref 150.0–400.0)
RBC: 4.65 Mil/uL (ref 4.22–5.81)
RDW: 14.4 % (ref 11.5–15.5)
WBC: 15.2 10*3/uL — ABNORMAL HIGH (ref 4.0–10.5)

## 2017-03-11 LAB — GRAM STAIN
MICRO NUMBER:: 81264571
SPECIMEN QUALITY:: ADEQUATE

## 2017-03-11 LAB — EXTRA URINE SPECIMEN

## 2017-03-11 MED ORDER — CEFDINIR 300 MG PO CAPS: 300.0000 mg | ORAL_CAPSULE | Freq: Two times a day (BID) | ORAL | 0 refills | Status: DC

## 2017-03-11 MED ORDER — CEFTRIAXONE SODIUM 1 G IJ SOLR
1.0000 g | Freq: Once | INTRAMUSCULAR | Status: AC
  Administered 2017-03-11: 1 g via INTRAMUSCULAR

## 2017-03-11 NOTE — Patient Instructions (Signed)
Same thing goes- if start to have fevers again or worsening symptoms seek care in emergency room  Shot given today. Start omnicef/cefdinir tomorrow morning and continue for 12 days.   May have culture results back Saturday to Monday. Will let you know once I look at them.

## 2017-03-11 NOTE — Telephone Encounter (Signed)
noted 

## 2017-03-11 NOTE — Telephone Encounter (Signed)
Spoke with [name], [facility] customer service agent, regarding "extra urine specimen received." I explained to Joseph Hayes that the specimen was sent for a gram stain. Kim called [facility] while I was on the phone, she stated that [facility] would pull the specimen and should have results for the gram stain by the end of today.

## 2017-03-11 NOTE — Progress Notes (Signed)
Subjective:  Joseph Hayes is a 76 y.o. year old very pleasant male patient who presents for/with See problem oriented charting ROS- no fever in last 15 hours, dysuria has resolved, cloudy urine resolved, urine odor improved.    Past Medical History-  Patient Active Problem List   Diagnosis Date Noted  . CAD (coronary artery disease) 05/23/2008    Priority: High  . Spinal cord infarction ([place]) 01/05/2007    Priority: High  . Hyperlipidemia 01/05/2007    Priority: Medium  . Essential hypertension 01/05/2007    Priority: Medium  . Eczema 07/10/2014    Priority: Low  . Psoriasis and similar disorders 07/23/2013    Priority: Low  . Vitamin B12 deficiency 10/17/2008    Priority: Low  . GERD 08/24/2007    Priority: Low  . UTI (urinary tract infection) 01/09/2007    Priority: Low  . Low vitamin D level 03/01/2017    Medications- reviewed and updated Current Outpatient Medications  Medication Sig Dispense Refill  . aspirin 81 MG EC tablet Take 1 tablet (81 mg total) by mouth daily. Swallow whole. (Patient taking differently: Take 81 mg by mouth every other day. Swallow whole.) 30 tablet 12  . Cholecalciferol (VITAMIN D3) 2000 UNITS capsule Take 1 capsule (2,000 Units total) by mouth daily.    . ciprofloxacin (CIPRO) 500 MG tablet TAKE 1 TABLET (500 MG TOTAL) BY MOUTH 2 (TWO) TIMES DAILY. 60 tablet 0  . CVS VITAMIN B-12 500 MCG tablet TAKE 1 TABLET BY MOUTH EVERY DAY 016 tablet 2  . folic acid (FOLVITE) 1 MG tablet Take 1 tablet (1 mg total) daily by mouth. 30 tablet 5  . meclizine (ANTIVERT) 25 MG tablet Take 1 tablet (25 mg total) by mouth 3 (three) times daily as needed. 30 tablet 5  . metoprolol tartrate (LOPRESSOR) 25 MG tablet TAKE 1/2 TABLET BY MOUTH TWICE A DAY 90 tablet 3  . omeprazole (PRILOSEC) 20 MG capsule Take 1 capsule (20 mg total) by mouth daily. 30 capsule 0  . simvastatin (ZOCOR) 40 MG tablet TAKE 1 TABLET EVERY DAY BY MOUTH 90 tablet 0  . triamcinolone cream  (KENALOG) 0.1 % Apply 1 application topically 2 (two) times daily. For 7-10 days max with flare up 454 g 1     Objective: BP 132/78 (BP Location: Left Arm, Patient Position: Sitting, Cuff Size: Large)   Pulse 71   Temp 98.3 F (36.8 C) (Oral)   SpO2 93%  Gen: NAD, resting comfortably- less fatigued appearing than yesterday CV: RRR no murmurs rubs or gallops Lungs: CTAB no crackles, wheeze, rhonchi Abdomen: soft/nontender/nondistended/normal bowel sounds.  No cva tenderness Ext: no edema Skin: warm, dry Neuro: wheelchair bound, depends on wife for transfers  Assessment/Plan:  Complicated UTI S: Patient was seen yesterday with dysuria, fever with concern for early urosepsis- he did have WBC elevation so technically did meet sepsis criteria with urine as source. Family preferred to stay out of hospital if at all possible. We treated with ceftriaxone in office with planned 24 hour follow up. He had been on 4 days of ciprofloxacin prior to visit.   Patient states he was afebrile by 1 30 AM- woke up and felt like "new man". Cloudiness gone. Smell gone. Slightly darker urine than baseline. Dysuria now resolved.  A/P: Drastic improvement within 24 hours- I am hopeful this means this was UTI instead of pyelonephritis. No prostatitis on exam yesterday. We will give another dose of ceftriaxone today then 12 more days  of omnicef for 14 day course. Gram stain negative for gram positive organisms so doubt enterococcus. Hopeful culture back by tomorrow. We still reviewed emergent precautions over weekend.   Future Appointments  Date Time Provider [place]  08/30/2017  9:45 AM Marin Olp, MD [facility] None   Meds ordered this encounter  Medications  . cefdinir (OMNICEF) 300 MG capsule    Sig: Take 1 capsule (300 mg total) 2 (two) times daily by mouth.    Dispense:  24 capsule    Refill:  0  . cefTRIAXone (ROCEPHIN) injection 1 g    Return precautions advised.  Garret Reddish,  MD

## 2017-03-13 ENCOUNTER — Encounter: Payer: Self-pay | Admitting: Family Medicine

## 2017-03-13 LAB — URINE CULTURE
MICRO NUMBER: 81259165
SPECIMEN QUALITY: ADEQUATE

## 2017-03-15 ENCOUNTER — Ambulatory Visit: Payer: Medicare Other | Admitting: Family Medicine

## 2017-03-31 ENCOUNTER — Other Ambulatory Visit: Payer: Self-pay

## 2017-03-31 MED ORDER — SIMVASTATIN 40 MG PO TABS: ORAL_TABLET | ORAL | 0 refills | Status: DC

## 2017-04-22 ENCOUNTER — Other Ambulatory Visit: Payer: Self-pay

## 2017-04-22 ENCOUNTER — Telehealth: Payer: Self-pay | Admitting: Family Medicine

## 2017-04-22 ENCOUNTER — Other Ambulatory Visit: Payer: Medicare Other

## 2017-04-22 DIAGNOSIS — N3000 Acute cystitis without hematuria: Secondary | ICD-10-CM | POA: Diagnosis not present

## 2017-04-22 MED ORDER — CEFDINIR 300 MG PO CAPS: 300.0000 mg | ORAL_CAPSULE | Freq: Two times a day (BID) | ORAL | 0 refills | Status: DC

## 2017-04-22 NOTE — Telephone Encounter (Signed)
With how sick he was last time and resistance on urine- he needs to be seen. We need a urine culture. Can anyone see him in one of the [place] sites? Does not have to be [facility] as we are full. If no one can see him today then can refill cefdinir 300mg  BID.   He needs a VISIT in the future- not calling in for antibiotics with his complexity so want to make sure that is clear. If cant get visit- can they get in to do a urine culture under dysuria perhaps?

## 2017-04-22 NOTE — Telephone Encounter (Signed)
Copied from [place]. Topic: Quick Communication - See Telephone Encounter >> Apr 22, 2017 12:55 PM Cleaster Corin, NT wrote: CRM for notification. See Telephone encounter for:   04/22/17. Pt. Wife calling to let Dr. Yong Channel know that the Cipro isn't working pt. Has an uti calling to see if Dr. Yong Channel can call in another antibiotic before uti gets worse ([place] [phone])  [id number], [place] - [address] [place] [address] Phone: [phone] Fax: [phone]

## 2017-04-22 NOTE — Telephone Encounter (Signed)
Called patient and left a voicemail message asking for a return phone call. I asked that patient drop off a Urine specimen here in the office today. I also asked that he schedule an appointment for Monday, April 25, 2017. I am sending in antibiotic to pharmacy.

## 2017-04-24 ENCOUNTER — Encounter: Payer: Self-pay | Admitting: Family Medicine

## 2017-04-25 ENCOUNTER — Ambulatory Visit: Payer: Medicare Other | Admitting: Family Medicine

## 2017-04-25 ENCOUNTER — Telehealth: Payer: Self-pay | Admitting: Family Medicine

## 2017-04-25 LAB — URINE CULTURE
MICRO NUMBER: 81439736
SPECIMEN QUALITY: ADEQUATE

## 2017-04-25 NOTE — Telephone Encounter (Signed)
This encounter was created in error - please disregard.

## 2017-04-25 NOTE — Telephone Encounter (Signed)
Patient's wife called in wanting to know if it is necessary to come in today for the follow up appointment today that was scheduled at his last OV on Friday. She says the patient is not having any urinary symptoms and is still taking the antibiotic. She says it takes 45 minutes to get to the office and he is in a wheelchair and if it is not necessary, she would like the provider, Dr. Yong Channel, to call her and let her know. She says she doesn't want him to be exposed to the flu and everything else from other patients. She said she sent a message this weekend in [place].

## 2017-04-25 NOTE — Telephone Encounter (Signed)
Patient's wife called in wanting to know if it is necessary to come in today for the follow up appointment today that was scheduled at his last OV on Friday. She says the patient is not having any urinary symptoms and is still taking the antibiotic. She says it takes 45 minutes to get to the office and he is in a wheelchair and if it is not necessary, she would like the provider, Dr. Yong Channel, to call her and let her know. She says she doesn't want him to be exposed to the flu and everything else from other patients. She said she sent a message this weekend in [place].

## 2017-05-11 ENCOUNTER — Telehealth: Payer: Self-pay | Admitting: Family Medicine

## 2017-05-11 NOTE — Telephone Encounter (Signed)
[employer] sent a fax that states that patient has not completed their cologuard screening(order number [id number], order date 03/01/17). They have made several attempts to contact patient.

## 2017-05-17 ENCOUNTER — Ambulatory Visit: Payer: Medicare Other | Admitting: Family Medicine

## 2017-05-17 ENCOUNTER — Encounter: Payer: Self-pay | Admitting: Family Medicine

## 2017-05-17 VITALS — BP 132/70 | HR 61 | Temp 98.0°F

## 2017-05-17 DIAGNOSIS — N3000 Acute cystitis without hematuria: Secondary | ICD-10-CM | POA: Diagnosis not present

## 2017-05-17 DIAGNOSIS — N390 Urinary tract infection, site not specified: Secondary | ICD-10-CM | POA: Diagnosis not present

## 2017-05-17 LAB — POC URINALSYSI DIPSTICK (AUTOMATED)
BILIRUBIN UA: NEGATIVE
Blood, UA: NEGATIVE
Glucose, UA: NEGATIVE
KETONES UA: NEGATIVE
Nitrite, UA: NEGATIVE
Protein, UA: NEGATIVE
Spec Grav, UA: 1.015 (ref 1.010–1.025)
UROBILINOGEN UA: 0.2 U/dL
pH, UA: 6.5 (ref 5.0–8.0)

## 2017-05-17 MED ORDER — POVIDONE-IODINE 10 % EX SWAB: 1.0000 "application " | CUTANEOUS | 12 refills | Status: DC | PRN

## 2017-05-17 MED ORDER — CEPHALEXIN 500 MG PO CAPS: 500.0000 mg | ORAL_CAPSULE | Freq: Four times a day (QID) | ORAL | 0 refills | Status: AC

## 2017-05-17 NOTE — Assessment & Plan Note (Signed)
Concern for UTI S: Patients symptoms started this morning.  Complains of dysuria: no; polyuria: yes; nocturia: no; urgency: yes.  Symptoms are worsening slightly. Has had to cath now 3x already today when normally does 3-5- feels urgency and has noted foul discolored urine.   Of note last documented UTI prior to 2018 was in 2014. Patient had been given a large volume script for ciprofloxacin which he used when he had symptoms by prior PCP. I continued this for infrequent use but was unaware that in 2018 patient had gone from every other month symptoms to usually every 2 weeks and was regularly using ciprofloxacin  03/10/17 developed UTI despite Cipro with E. Coli. Sensitivities below. We did 2 injections of ceftriaxone in addition to 12 days of omnicef. Sensitivities below  Escherichia coli    URINE CULTURE, REFLEX    AMOX/CLAVULANIC 4  Sensitive    AMPICILLIN  Resistant    AMPICILLIN/SULBACTAM 4  Sensitive    CEFAZOLIN <=4  Not Reportable1    CEFEPIME <=1  Sensitive    CEFTRIAXONE <=1  Sensitive    CIPROFLOXACIN >=4  Resistant    ERTAPENEM <=0.5  Sensitive    GENTAMICIN <=1  Sensitive    IMIPENEM <=0.25  Sensitive    LEVOFLOXACIN >=8  Resistant    NITROFURANTOIN <=16  Sensitive    PIP/TAZO <=4  Sensitive    TOBRAMYCIN <=1  Sensitive    TRIMETH/SULFA >=320  Resistant2    Then developed another UTI 04/22/17 which had similar resistance pattern on 04/22/17 despite ciprofloxacin. Was also E. Coli. We used 12 days of omnicef  Escherichia coli    URINE CULTURE, REFLEX    AMOX/CLAVULANIC 4  Sensitive    AMPICILLIN  Resistant    AMPICILLIN/SULBACTAM 4  Sensitive    CEFAZOLIN <=4  Not Reportable1    CEFEPIME <=1  Sensitive    CEFTRIAXONE <=1  Sensitive    CIPROFLOXACIN >=4  Resistant    ERTAPENEM <=0.5  Sensitive    GENTAMICIN <=1  Sensitive    IMIPENEM <=0.25  Sensitive    LEVOFLOXACIN >=8  Resistant    NITROFURANTOIN <=16  Sensitive    PIP/TAZO <=4  Sensitive    TOBRAMYCIN <=1   Sensitive    TRIMETH/SULFA >=320  Resistant2    A/P: UA concerning for potential UTI. Will get culture. Empiric treatment with: keflex 4x a day for 2 weeks. High risk patient with history of spinal cord infarction. He self caths 4-5 x a day. New cath each time then has tubing to take to toilet (they reuses this but chlorox it) . He only uses a wet wipe to clean so we opted to try betadine- will have to use swabs as due to infarction cannot use hands well enough to use a stick of betadine.   Given recurrence of infections- will refer to urology for their opinion as well in this high risk patient- family wants to see if they can do anything different to cut down on recurrence of infections in addition to changes above. Would also ask for their opinion on prophylactic antibiotic use  I am very concerned due to increasing resistance of his UTIs

## 2017-05-17 NOTE — Progress Notes (Signed)
Subjective:  Joseph Hayes is a 77 y.o. year old very pleasant male patient who presents for/with See problem oriented charting ROS- no fever, chills, nausea, vomiting, flank pain. No blood in urine.   Past Medical History-  Patient Active Problem List   Diagnosis Date Noted  . Recurrent UTI 05/17/2017    Priority: High  . CAD (coronary artery disease) 05/23/2008    Priority: High  . Spinal cord infarction ([place]) 01/05/2007    Priority: High  . Hyperlipidemia 01/05/2007    Priority: Medium  . Essential hypertension 01/05/2007    Priority: Medium  . Eczema 07/10/2014    Priority: Low  . Psoriasis and similar disorders 07/23/2013    Priority: Low  . Vitamin B12 deficiency 10/17/2008    Priority: Low  . GERD 08/24/2007    Priority: Low  . UTI (urinary tract infection) 01/09/2007    Priority: Low  . Low vitamin D level 03/01/2017    Medications- reviewed and updated Current Outpatient Medications  Medication Sig Dispense Refill  . aspirin 81 MG EC tablet Take 1 tablet (81 mg total) by mouth daily. Swallow whole. (Patient taking differently: Take 81 mg by mouth every other day. Swallow whole.) 30 tablet 12  . Cholecalciferol (VITAMIN D3) 2000 UNITS capsule Take 1 capsule (2,000 Units total) by mouth daily.    . CVS VITAMIN B-12 500 MCG tablet TAKE 1 TABLET BY MOUTH EVERY DAY 086 tablet 2  . folic acid (FOLVITE) 1 MG tablet Take 1 tablet (1 mg total) daily by mouth. 30 tablet 5  . meclizine (ANTIVERT) 25 MG tablet Take 1 tablet (25 mg total) by mouth 3 (three) times daily as needed. 30 tablet 5  . metoprolol tartrate (LOPRESSOR) 25 MG tablet TAKE 1/2 TABLET BY MOUTH TWICE A DAY 90 tablet 3  . omeprazole (PRILOSEC) 20 MG capsule Take 1 capsule (20 mg total) by mouth daily. 30 capsule 0  . simvastatin (ZOCOR) 40 MG tablet TAKE 1 TABLET EVERY DAY BY MOUTH 90 tablet 0  . triamcinolone cream (KENALOG) 0.1 % Apply 1 application topically 2 (two) times daily. For 7-10 days max with flare  up 454 g 1  . cephALEXin (KEFLEX) 500 MG capsule Take 1 capsule (500 mg total) by mouth 4 (four) times daily for 14 days. 56 capsule 0  . povidone-iodine 10 % swab Apply 1 application topically as needed. To use before self catheterization 100 each 12   No current facility-administered medications for this visit.     Objective: BP 132/70 (BP Location: Left Arm, Patient Position: Sitting, Cuff Size: Large)   Pulse 61   Temp 98 F (36.7 C) (Oral)   SpO2 97%  Gen: NAD, resting comfortably in his wheelchair CV: RRR no murmurs rubs or gallops Lungs: CTAB no crackles, wheeze, rhonchi No cva tenderness Ext: bilateral 1+ edema but L>R Skin: warm, dry  Results for orders placed or performed in visit on 05/17/17 (from the past 24 hour(s))  POCT Urinalysis Dipstick (Automated)     Status: Abnormal   Collection Time: 05/17/17 11:32 AM  Result Value Ref Range   Color, UA Yellow    Clarity, UA Slightly Cloudy    Glucose, UA Negative    Bilirubin, UA Negative    Ketones, UA Negative    Spec Grav, UA 1.015 1.010 - 1.025   Blood, UA Negative    pH, UA 6.5 5.0 - 8.0   Protein, UA Negative    Urobilinogen, UA 0.2 0.2 or 1.0  E.U./dL   Nitrite, UA Negative    Leukocytes, UA Moderate (2+) (A) Negative   Assessment/Plan:  Recurrent UTI Concern for UTI S: Patients symptoms started this morning.  Complains of dysuria: no; polyuria: yes; nocturia: no; urgency: yes.  Symptoms are worsening slightly. Has had to cath now 3x already today when normally does 3-5- feels urgency and has noted foul discolored urine.   Of note last documented UTI prior to 2018 was in 2014. Patient had been given a large volume script for ciprofloxacin which he used when he had symptoms by prior PCP. I continued this for infrequent use but was unaware that in 2018 patient had gone from every other month symptoms to usually every 2 weeks and was regularly using ciprofloxacin  03/10/17 developed UTI despite Cipro with E.  Coli. Sensitivities below. We did 2 injections of ceftriaxone in addition to 12 days of omnicef. Sensitivities below  Escherichia coli    URINE CULTURE, REFLEX    AMOX/CLAVULANIC 4  Sensitive    AMPICILLIN  Resistant    AMPICILLIN/SULBACTAM 4  Sensitive    CEFAZOLIN <=4  Not Reportable1    CEFEPIME <=1  Sensitive    CEFTRIAXONE <=1  Sensitive    CIPROFLOXACIN >=4  Resistant    ERTAPENEM <=0.5  Sensitive    GENTAMICIN <=1  Sensitive    IMIPENEM <=0.25  Sensitive    LEVOFLOXACIN >=8  Resistant    NITROFURANTOIN <=16  Sensitive    PIP/TAZO <=4  Sensitive    TOBRAMYCIN <=1  Sensitive    TRIMETH/SULFA >=320  Resistant2    Then developed another UTI 04/22/17 which had similar resistance pattern on 04/22/17 despite ciprofloxacin. Was also E. Coli. We used 12 days of omnicef  Escherichia coli    URINE CULTURE, REFLEX    AMOX/CLAVULANIC 4  Sensitive    AMPICILLIN  Resistant    AMPICILLIN/SULBACTAM 4  Sensitive    CEFAZOLIN <=4  Not Reportable1    CEFEPIME <=1  Sensitive    CEFTRIAXONE <=1  Sensitive    CIPROFLOXACIN >=4  Resistant    ERTAPENEM <=0.5  Sensitive    GENTAMICIN <=1  Sensitive    IMIPENEM <=0.25  Sensitive    LEVOFLOXACIN >=8  Resistant    NITROFURANTOIN <=16  Sensitive    PIP/TAZO <=4  Sensitive    TOBRAMYCIN <=1  Sensitive    TRIMETH/SULFA >=320  Resistant2    A/P: UA concerning for potential UTI. Will get culture. Empiric treatment with: keflex 4x a day for 2 weeks. High risk patient with history of spinal cord infarction. He self caths 4-5 x a day. New cath each time then has tubing to take to toilet (they reuses this but chlorox it) . He only uses a wet wipe to clean so we opted to try betadine- will have to use swabs as due to infarction cannot use hands well enough to use a stick of betadine.   Given recurrence of infections- will refer to urology for their opinion as well in this high risk patient- family wants to see if they can do anything different to cut down on  recurrence of infections in addition to changes above. Would also ask for their opinion on prophylactic antibiotic use  I am very concerned due to increasing resistance of his UTIs  Patient to follow up if new or worsening symptoms or failure to improve.   Future Appointments  Date Time Provider [place]  08/30/2017  9:45 AM Marin Olp, MD [facility]  [facility]   Lab/Order associations: Acute cystitis without hematuria - Plan: POCT Urinalysis Dipstick (Automated), Urine Culture, Urine Culture, Ambulatory referral to Urology  Recurrent UTI - Plan: Ambulatory referral to Urology  Meds ordered this encounter  Medications  . povidone-iodine 10 % swab    Sig: Apply 1 application topically as needed. To use before self catheterization    Dispense:  100 each    Refill:  12  . cephALEXin (KEFLEX) 500 MG capsule    Sig: Take 1 capsule (500 mg total) by mouth 4 (four) times daily for 14 days.    Dispense:  56 capsule    Refill:  0   The duration of face-to-face time during this visit was greater than 25 minutes. Greater than 50% of this time was spent in counseling, explanation of diagnosis, planning of further management, and/or coordination of care including discussion of why he is higher risk for UTI, discussion of resistant bacteria/how they develop, options to try to reduce risk, importance of urology follow up, reviewing prior UTI and resistance patterns.    Return precautions advised.  Garret Reddish, MD

## 2017-05-17 NOTE — Patient Instructions (Addendum)
Try the betadine wipes- would use 2 at first  Keflex 4x a day for 14 days  We will call you within a week or two about your referral to urology. If you do not hear within 3 weeks, give [place] a call.   Should have culture data back by end of the week. If you have fever or worsening symptoms  Despite treatment let us know

## 2017-05-20 LAB — URINE CULTURE
MICRO NUMBER: 90060464
SPECIMEN QUALITY:: ADEQUATE

## 2017-05-20 NOTE — Telephone Encounter (Signed)
Noted  

## 2017-05-28 ENCOUNTER — Encounter: Payer: Self-pay | Admitting: Family Medicine

## 2017-05-29 ENCOUNTER — Encounter: Payer: Self-pay | Admitting: Family Medicine

## 2017-06-02 DIAGNOSIS — N3020 Other chronic cystitis without hematuria: Secondary | ICD-10-CM | POA: Diagnosis not present

## 2017-06-02 DIAGNOSIS — N318 Other neuromuscular dysfunction of bladder: Secondary | ICD-10-CM | POA: Diagnosis not present

## 2017-06-16 DIAGNOSIS — N390 Urinary tract infection, site not specified: Secondary | ICD-10-CM | POA: Diagnosis not present

## 2017-06-16 DIAGNOSIS — N200 Calculus of kidney: Secondary | ICD-10-CM | POA: Diagnosis not present

## 2017-06-16 DIAGNOSIS — N3020 Other chronic cystitis without hematuria: Secondary | ICD-10-CM | POA: Diagnosis not present

## 2017-06-28 DIAGNOSIS — D49511 Neoplasm of unspecified behavior of right kidney: Secondary | ICD-10-CM | POA: Diagnosis not present

## 2017-06-28 DIAGNOSIS — N3020 Other chronic cystitis without hematuria: Secondary | ICD-10-CM | POA: Diagnosis not present

## 2017-07-07 ENCOUNTER — Other Ambulatory Visit: Payer: Self-pay

## 2017-07-07 DIAGNOSIS — D49511 Neoplasm of unspecified behavior of right kidney: Secondary | ICD-10-CM | POA: Diagnosis not present

## 2017-07-07 DIAGNOSIS — N281 Cyst of kidney, acquired: Secondary | ICD-10-CM | POA: Diagnosis not present

## 2017-07-07 DIAGNOSIS — N390 Urinary tract infection, site not specified: Secondary | ICD-10-CM | POA: Diagnosis not present

## 2017-07-07 DIAGNOSIS — N200 Calculus of kidney: Secondary | ICD-10-CM | POA: Diagnosis not present

## 2017-07-07 DIAGNOSIS — K449 Diaphragmatic hernia without obstruction or gangrene: Secondary | ICD-10-CM | POA: Diagnosis not present

## 2017-07-07 MED ORDER — SIMVASTATIN 40 MG PO TABS: ORAL_TABLET | ORAL | 1 refills | Status: DC

## 2017-07-19 DIAGNOSIS — N318 Other neuromuscular dysfunction of bladder: Secondary | ICD-10-CM | POA: Diagnosis not present

## 2017-07-19 DIAGNOSIS — N281 Cyst of kidney, acquired: Secondary | ICD-10-CM | POA: Diagnosis not present

## 2017-07-19 DIAGNOSIS — N3020 Other chronic cystitis without hematuria: Secondary | ICD-10-CM | POA: Diagnosis not present

## 2017-08-02 DIAGNOSIS — R339 Retention of urine, unspecified: Secondary | ICD-10-CM | POA: Diagnosis not present

## 2017-08-02 DIAGNOSIS — Z87440 Personal history of urinary (tract) infections: Secondary | ICD-10-CM | POA: Diagnosis not present

## 2017-08-29 ENCOUNTER — Ambulatory Visit: Payer: Medicare Other | Admitting: Family Medicine

## 2017-08-29 ENCOUNTER — Other Ambulatory Visit: Payer: Self-pay

## 2017-08-29 MED ORDER — FOLIC ACID 1 MG PO TABS: 1.0000 mg | ORAL_TABLET | Freq: Every day | ORAL | 5 refills | Status: DC

## 2017-08-30 ENCOUNTER — Ambulatory Visit (INDEPENDENT_AMBULATORY_CARE_PROVIDER_SITE_OTHER): Payer: Medicare HMO | Admitting: Family Medicine

## 2017-08-30 ENCOUNTER — Encounter: Payer: Self-pay | Admitting: Family Medicine

## 2017-08-30 VITALS — BP 134/78 | HR 54 | Temp 97.5°F | Ht 69.0 in

## 2017-08-30 DIAGNOSIS — E785 Hyperlipidemia, unspecified: Secondary | ICD-10-CM | POA: Diagnosis not present

## 2017-08-30 DIAGNOSIS — G9511 Acute infarction of spinal cord (embolic) (nonembolic): Secondary | ICD-10-CM

## 2017-08-30 DIAGNOSIS — N390 Urinary tract infection, site not specified: Secondary | ICD-10-CM | POA: Diagnosis not present

## 2017-08-30 DIAGNOSIS — I10 Essential (primary) hypertension: Secondary | ICD-10-CM

## 2017-08-30 DIAGNOSIS — I2510 Atherosclerotic heart disease of native coronary artery without angina pectoris: Secondary | ICD-10-CM | POA: Diagnosis not present

## 2017-08-30 LAB — COMPREHENSIVE METABOLIC PANEL
ALT: 21 U/L (ref 0–53)
AST: 18 U/L (ref 0–37)
Albumin: 4 g/dL (ref 3.5–5.2)
Alkaline Phosphatase: 98 U/L (ref 39–117)
BILIRUBIN TOTAL: 0.4 mg/dL (ref 0.2–1.2)
BUN: 17 mg/dL (ref 6–23)
CALCIUM: 9.2 mg/dL (ref 8.4–10.5)
CO2: 30 mEq/L (ref 19–32)
Chloride: 104 mEq/L (ref 96–112)
Creatinine, Ser: 0.77 mg/dL (ref 0.40–1.50)
GFR: 104.74 mL/min (ref >60.00)
Glucose, Bld: 79 mg/dL (ref 70–99)
Potassium: 4.4 mEq/L (ref 3.5–5.1)
Sodium: 140 mEq/L (ref 135–145)
TOTAL PROTEIN: 6.8 g/dL (ref 6.0–8.3)

## 2017-08-30 LAB — CBC WITH DIFFERENTIAL/PLATELET
BASOS PCT: 0.9 % (ref 0.0–3.0)
Basophils Absolute: 0 10*3/uL (ref 0.0–0.1)
EOS ABS: 0.4 10*3/uL (ref 0.0–0.7)
Eosinophils Relative: 8 % — ABNORMAL HIGH (ref 0.0–5.0)
HEMATOCRIT: 40.2 % (ref 39.0–52.0)
Hemoglobin: 13.3 g/dL (ref 13.0–17.0)
LYMPHS ABS: 1.5 10*3/uL (ref 0.7–4.0)
Lymphocytes Relative: 28.1 % (ref 12.0–46.0)
MCHC: 33.2 g/dL (ref 30.0–36.0)
MCV: 84.8 fl (ref 78.0–100.0)
MONOS PCT: 9.9 % (ref 3.0–12.0)
Monocytes Absolute: 0.5 10*3/uL (ref 0.1–1.0)
Neutro Abs: 2.8 10*3/uL (ref 1.4–7.7)
Neutrophils Relative %: 53.1 % (ref 43.0–77.0)
PLATELETS: 267 10*3/uL (ref 150.0–400.0)
RBC: 4.74 Mil/uL (ref 4.22–5.81)
RDW: 14.3 % (ref 11.5–15.5)
WBC: 5.2 10*3/uL (ref 4.0–10.5)

## 2017-08-30 LAB — LDL CHOLESTEROL, DIRECT: Direct LDL: 106 mg/dL

## 2017-08-30 NOTE — Progress Notes (Signed)
Subjective:  Joseph Hayes is a 77 y.o. year old very pleasant male patient who presents for/with See problem oriented charting ROS-no recent fever or chills.No chest pain or shortness of breath. No headache reported.  Past Medical History-  Patient Active Problem List   Diagnosis Date Noted  . Recurrent UTI 05/17/2017    Priority: High  . CAD (coronary artery disease) 05/23/2008    Priority: High  . Spinal cord infarction ([place]) 01/05/2007    Priority: High  . Hyperlipidemia 01/05/2007    Priority: Medium  . Essential hypertension 01/05/2007    Priority: Medium  . Eczema 07/10/2014    Priority: Low  . Psoriasis and similar disorders 07/23/2013    Priority: Low  . Vitamin B12 deficiency 10/17/2008    Priority: Low  . GERD 08/24/2007    Priority: Low  . UTI (urinary tract infection) 01/09/2007    Priority: Low  . Low vitamin D level 03/01/2017    Medications- reviewed and updated Current Outpatient Medications  Medication Sig Dispense Refill  . nitrofurantoin (MACRODANTIN) 100 MG capsule Take 100 mg by mouth at bedtime.    Marland Kitchen aspirin 81 MG EC tablet Take 1 tablet (81 mg total) by mouth daily. Swallow whole. (Patient taking differently: Take 81 mg by mouth every other day. Swallow whole.) 30 tablet 12  . CVS VITAMIN B-12 500 MCG tablet TAKE 1 TABLET BY MOUTH EVERY DAY 426 tablet 2  . folic acid (FOLVITE) 1 MG tablet Take 1 tablet (1 mg total) by mouth daily. 30 tablet 5  . meclizine (ANTIVERT) 25 MG tablet Take 1 tablet (25 mg total) by mouth 3 (three) times daily as needed. 30 tablet 5  . metoprolol tartrate (LOPRESSOR) 25 MG tablet TAKE 1/2 TABLET BY MOUTH TWICE A DAY 90 tablet 3  . omeprazole (PRILOSEC) 20 MG capsule Take 1 capsule (20 mg total) by mouth daily. 30 capsule 0  . povidone-iodine 10 % swab Apply 1 application topically as needed. To use before self catheterization 100 each 12  . simvastatin (ZOCOR) 40 MG tablet TAKE 1 TABLET EVERY DAY BY MOUTH 90 tablet 1  .  triamcinolone cream (KENALOG) 0.1 % Apply 1 application topically 2 (two) times daily. For 7-10 days max with flare up 454 g 1   No current facility-administered medications for this visit.     Objective: BP 134/78 (BP Location: Left Arm, Patient Position: Sitting, Cuff Size: Normal)   Pulse (!) 54   Temp (!) 97.5 F (36.4 C) (Oral)   Ht 5\' 9"  (1.753 m)   SpO2 97%   BMI 26.58 kg/m  Gen: NAD, resting comfortably. Supported by wife CV: Slightly bradycardic but regular.  No murmurs rubs or gallops Lungs: CTAB no crackles, wheeze, rhonchi Abdomen: soft/nontender/nondistended/normal bowel sounds.  Ext: no edema Skin: warm, dry Neuro: Wheelchair-bound.    Assessment/Plan:  CAD (coronary artery disease) S: no chest pain or shortness of breath. compliant with aspirin every other day for now due to bad bruising/bleeding, metoprolol, and simvastatin A/P: he wants referral back to cardiology- didn't follow up last year when referred.   Essential hypertension S: controlled on half tablet of metoprolol 25mg  BID BP Readings from Last 3 Encounters:  08/30/17 134/78  05/17/17 132/70  03/11/17 132/78  A/P: We discussed blood pressure goal of <140/90. Continue current meds  Hyperlipidemia S: slight poorly controlled on simvastatin 40mg  Lab Results  Component Value Date   CHOL 150 03/01/2017   HDL 45.40 03/01/2017   LDLCALC 76  03/01/2017   LDLDIRECT 77.0 06/04/2015   TRIG 145.0 03/01/2017   CHOLHDL 3 03/01/2017   A/P:  he requests to hold off on statin change until discusses with cardiology. I think trial of rosuvastatin 10 or 20mg  would be reasonable.   Recurrent UTI UTI recurrent related to neurogenic bladder-related to spinal cord infarction S: recurrent UTi. Had been placed on methanamine then later cipro for prophylaxis. May have had resistance becaues 2 days later changed to bactrim about a month ago- has not had recurrence in the last month.  A/P: Luckily he has been UTI free  over the last month.  We will follow-up for symptoms only.  Continue follow-up with urology.    Return in about 6 months (around 03/01/2018) for physical.  Lab/Order associations:  Coronary artery disease involving native coronary artery of native heart without angina pectoris - Plan: Comprehensive metabolic panel, LDL cholesterol, direct  Hyperlipidemia, unspecified hyperlipidemia type - Plan: Comprehensive metabolic panel, LDL cholesterol, direct, CBC with Differential/Platelet  Return precautions advised.  Garret Reddish, MD

## 2017-08-30 NOTE — Progress Notes (Signed)
Bad cholesterol has worsened and is now over 100 at 106. If you are taking simvastatin everyday I would advise we increase to atorvastatin 40mg  or rosuvastatin 20mg  #90 with 3 refills to see if we can get this lower Your CBC was largely normal (blood counts, infection fighting cells, platelets). Your CMET was normal (kidney, liver, and electrolytes, blood sugar).

## 2017-08-30 NOTE — Assessment & Plan Note (Signed)
UTI recurrent related to neurogenic bladder-related to spinal cord infarction S: recurrent UTi. Had been placed on methanamine then later cipro for prophylaxis. May have had resistance becaues 2 days later changed to bactrim about a month ago- has not had recurrence in the last month.  A/P: Luckily he has been UTI free over the last month.  We will follow-up for symptoms only.  Continue follow-up with urology.

## 2017-08-30 NOTE — Assessment & Plan Note (Signed)
S: no chest pain or shortness of breath. compliant with aspirin every other day for now due to bad bruising/bleeding, metoprolol, and simvastatin A/P: he wants referral back to cardiology- didn't follow up last year when referred.

## 2017-08-30 NOTE — Assessment & Plan Note (Signed)
S: slight poorly controlled on simvastatin 40mg  Lab Results  Component Value Date   CHOL 150 03/01/2017   HDL 45.40 03/01/2017   LDLCALC 76 03/01/2017   LDLDIRECT 77.0 06/04/2015   TRIG 145.0 03/01/2017   CHOLHDL 3 03/01/2017   A/P:  he requests to hold off on statin change until discusses with cardiology. I think trial of rosuvastatin 10 or 20mg  would be reasonable.

## 2017-08-30 NOTE — Assessment & Plan Note (Signed)
S: controlled on half tablet of metoprolol 25mg  BID BP Readings from Last 3 Encounters:  08/30/17 134/78  05/17/17 132/70  03/11/17 132/78  A/P: We discussed blood pressure goal of <140/90. Continue current meds

## 2017-08-30 NOTE — Patient Instructions (Addendum)
Please stop by lab before you go  Weigh your wheelchair at home and we can then subtract that out in future for his weight  We will call you within a week or two about your referral to cardiology. If you do not hear within 3 weeks, give [place] a call.

## 2017-09-05 ENCOUNTER — Telehealth: Payer: Self-pay

## 2017-09-05 NOTE — Telephone Encounter (Signed)
Called patient and left a message to call office back.  

## 2017-11-04 DIAGNOSIS — N281 Cyst of kidney, acquired: Secondary | ICD-10-CM | POA: Diagnosis not present

## 2017-11-04 DIAGNOSIS — N318 Other neuromuscular dysfunction of bladder: Secondary | ICD-10-CM | POA: Diagnosis not present

## 2017-11-04 DIAGNOSIS — N3020 Other chronic cystitis without hematuria: Secondary | ICD-10-CM | POA: Diagnosis not present

## 2017-11-24 ENCOUNTER — Telehealth: Payer: Self-pay | Admitting: Family Medicine

## 2017-11-24 ENCOUNTER — Other Ambulatory Visit: Payer: Self-pay | Admitting: Family Medicine

## 2017-11-24 ENCOUNTER — Other Ambulatory Visit: Payer: Self-pay

## 2017-11-24 DIAGNOSIS — I2510 Atherosclerotic heart disease of native coronary artery without angina pectoris: Secondary | ICD-10-CM

## 2017-11-24 NOTE — Telephone Encounter (Signed)
Copied from [place] [phone]. Topic: Referral - Medical Records >> Nov 24, 2017  2:30 PM Hewitt Shorts wrote: The cardiologist that the patient was originally referred to in April and the records that was faxed on 10/20/17 is not  at the office they need a new more current referral   Best number [phone]    Per Chart it looks like the last place he was sent was [facility]- the referral is still open, but I believe they would like a new one placed as they contacted the patient multiple times and never heard back.

## 2017-11-24 NOTE — Telephone Encounter (Signed)
I have placed a new referral for patient.

## 2017-11-24 NOTE — Telephone Encounter (Signed)
Copied from [place] [phone]. Topic: Quick Communication - Rx Refill/Question >> Nov 24, 2017  2:21 PM Bea Graff, NT wrote: Medication: CVS VITAMIN B-12 500 MCG tablet   Has the patient contacted their pharmacy? Yes.   (Agent: If no, request that the patient contact the pharmacy for the refill.) (Agent: If yes, when and what did the pharmacy advise?)  Preferred Pharmacy (with phone number or street name): [id number] [place], [place] [phone] (Phone) [phone] (Fax)      Agent: Please be advised that RX refills may take up to 3 business days. We ask that you follow-up with your pharmacy.

## 2017-11-25 DIAGNOSIS — R339 Retention of urine, unspecified: Secondary | ICD-10-CM | POA: Diagnosis not present

## 2017-11-25 DIAGNOSIS — Z87440 Personal history of urinary (tract) infections: Secondary | ICD-10-CM | POA: Diagnosis not present

## 2017-11-25 MED ORDER — CYANOCOBALAMIN 500 MCG PO TABS: 500.0000 ug | ORAL_TABLET | Freq: Every day | ORAL | 2 refills | Status: DC

## 2017-11-25 NOTE — Telephone Encounter (Signed)
See note

## 2017-11-25 NOTE — Telephone Encounter (Signed)
Refill of B-12  LRF 01/06/17  #100 2 refills  LOV 08/30/17 Dr. Yong Channel  Last B-12 lab draw 03/01/17   [id number] [place], [place] [address]         [phone] (Phone) [phone] (Fax)

## 2017-11-29 ENCOUNTER — Other Ambulatory Visit: Payer: Self-pay

## 2017-11-29 MED ORDER — METOPROLOL TARTRATE 25 MG PO TABS: 12.5000 mg | ORAL_TABLET | Freq: Two times a day (BID) | ORAL | 3 refills | Status: DC

## 2017-12-22 NOTE — Progress Notes (Signed)
Cardiology Office Note:    Date:  12/26/2017   ID:  Joseph Hayes, DOB 05-06-1940, MRN [id number]  PCP:  Marin Olp, MD  Cardiologist:  Kirk Ruths, MD   Referring MD: Marin Olp, MD   Chief Complaint  Patient presents with  . New Patient (Initial Visit)    hyperlipidemia, CAD    History of Present Illness:    Joseph Hayes is a 77 y.o. male with a hx of CAD, hypertension, spinal cord infarction, GERD, an hyperlipidemia. He presents after referral/new patient evaluation from PCP for  his CAD and uncontrolled LDL He takes ASA every other day due to bruising. Med list includes lopressor and simvastatin.   He is here with his wife. Per the patient and his wife, it sounds like had an MI in 1999. He was taken to the cath lab straight from the ER and received 2 stents. Vessels unknown (completed at [facility], Dr. Milana Kidney). I do not have these records. Since that time, he suffered a stroke in 2006 that resulted in a spinal infarction and paralyzation. He is in a wheelchair. He was taking lipitor at the time of the stroke and they seem to correlate lipitor with the stroke. We had a long discussion about the mechanism of action of statins as well as his LDL goal following and MI.   He has a remote smoking history but quit more than 30 years ago. He does not drink alcohol and reports no illicit drug use. He has a family history of heart disease in his father when he was in his 61s. No family HTN or DM.  Past Medical History:  Diagnosis Date  . BPH (benign prostatic hypertrophy)   . Coronary artery disease   . ERECTILE DYSFUNCTION, ORGANIC 05/16/2007  . H/O: CVA (cardiovascular accident)   . Hiatal hernia    wheelchair bound stroke residual  . History of shingles 04/07/2009   Treated shingles 2010 s/p immunization      . Hyperlipidemia   . Hypertension   . MI, old   . SHINGLES 04/07/2009  . Stroke [facility]) 2006    Past Surgical History:  Procedure Laterality Date  .  CARDIAC CATHETERIZATION    . CORONARY ANGIOPLASTY      Current Medications: Current Meds  Medication Sig  . aspirin EC 81 MG tablet Take 81 mg by mouth every other day.  . folic acid (FOLVITE) 1 MG tablet Take 1 tablet (1 mg total) by mouth daily.  . meclizine (ANTIVERT) 25 MG tablet Take 1 tablet (25 mg total) by mouth 3 (three) times daily as needed.  . metoprolol tartrate (LOPRESSOR) 25 MG tablet Take 0.5 tablets (12.5 mg total) by mouth 2 (two) times daily.  . nitrofurantoin (MACRODANTIN) 100 MG capsule Take 100 mg by mouth at bedtime.  Marland Kitchen omeprazole (PRILOSEC) 20 MG capsule Take 1 capsule (20 mg total) by mouth daily.  . povidone-iodine 10 % swab Apply 1 application topically as needed. To use before self catheterization  . triamcinolone cream (KENALOG) 0.1 % Apply 1 application topically 2 (two) times daily. For 7-10 days max with flare up  . vitamin B-12 (CVS VITAMIN B-12) 500 MCG tablet Take 1 tablet (500 mcg total) by mouth daily.  . [DISCONTINUED] simvastatin (ZOCOR) 40 MG tablet TAKE 1 TABLET EVERY DAY BY MOUTH     Allergies:   Patient has no known allergies.   Social History   Socioeconomic History  . Marital status: Married  Spouse name: Not on file  . Number of children: Not on file  . Years of education: Not on file  . Highest education level: Not on file  Occupational History  . Occupation: self employed  Social Needs  . Financial resource strain: Not on file  . Food insecurity:    Worry: Not on file    Inability: Not on file  . Transportation needs:    Medical: Not on file    Non-medical: Not on file  Tobacco Use  . Smoking status: Former Smoker    Last attempt to quit: 05/03/1974    Years since quitting: 43.6  . Smokeless tobacco: Former Network engineer and Sexual Activity  . Alcohol use: No  . Drug use: No  . Sexual activity: Yes  Lifestyle  . Physical activity:    Days per week: Not on file    Minutes per session: Not on file  . Stress: Not on  file  Relationships  . Social connections:    Talks on phone: Not on file    Gets together: Not on file    Attends religious service: Not on file    Active member of club or organization: Not on file    Attends meetings of clubs or organizations: Not on file    Relationship status: Not on file  Other Topics Concern  . Not on file  Social History Narrative   Married (wife patient of Dr. Yong Channel). 2 sons. 1 granddaughter (2001).       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 Packs over 1000 eggs a day.       Hobbies: time with wife and working at [employer].      Family History: The patient's family history includes Alcohol abuse in his unknown relative; Heart disease in his father; Hyperlipidemia in his unknown relative; Hypertension in his unknown relative.  ROS:   Please see the history of present illness.     All other systems reviewed and are negative.  EKGs/Labs/Other Studies Reviewed:    The following studies were reviewed today:  none  EKG:  EKG is \ ordered today.  The ekg ordered today demonstrates sinus rhythm  Recent Labs: 08/30/2017: ALT 21; BUN 17; Creatinine, Ser 0.77; Hemoglobin 13.3; Platelets 267.0; Potassium 4.4; Sodium 140  Recent Lipid Panel    Component Value Date/Time   CHOL 150 03/01/2017 1049   TRIG 145.0 03/01/2017 1049   TRIG 138 03/17/2006 1231   HDL 45.40 03/01/2017 1049   CHOLHDL 3 03/01/2017 1049   VLDL 29.0 03/01/2017 1049   LDLCALC 76 03/01/2017 1049   LDLDIRECT 106.0 08/30/2017 0949    Physical Exam:    VS:  BP 131/72 (BP Location: Right Arm)   Pulse (!) 53   Ht 5\' 9"  (1.753 m)   BMI 26.58 kg/m     Wt Readings from Last 3 Encounters:  10/02/12 180 lb (81.6 kg)  07/11/12 170 lb 14.4 oz (77.5 kg)  11/29/11 180 lb (81.6 kg)     GEN:  Well nourished, well developed in no acute distress HEENT: Normal NECK: No JVD; No carotid bruits CARDIAC: RRR, no murmurs, rubs, gallops RESPIRATORY:  Clear to  auscultation without rales, wheezing or rhonchi  ABDOMEN: Soft, non-tender, non-distended MUSCULOSKELETAL:  No edema; No deformity  SKIN: Warm and dry NEUROLOGIC:  Alert and oriented x 3 PSYCHIATRIC:  Normal affect   ASSESSMENT:    1. Hyperlipidemia, unspecified hyperlipidemia type   2. Essential hypertension  3. Coronary artery disease involving native heart with angina pectoris, unspecified vessel or lesion type ([place])   4. Spinal cord infarction [facility])    PLAN:    In order of problems listed above:  Hyperlipidemia, unspecified hyperlipidemia type - Plan: EKG 12-Lead, Lipid panel Last LDL in 08/2017 was 106. We had a long discussion about recommendations and high intensity statins in addition to mechanism of action. He is agreeable to switch to a high intensity statin - will start crestor 40 mg. If he tolerates this, we will recheck lipids in 6 weeks. If he is intolerant of crestor, we will set him up in lipid clinic for PCSK9 inhibitor.   Essential hypertension - Plan: EKG 12-Lead, Lipid panel Pressure is well controlled. No medication changes.   Coronary artery disease involving native heart with angina pectoris, unspecified vessel or lesion type ([place]) - MI in 1999 with 2 stents placed Patient has done well from a cardiac perspective since 1999. He reports no anginal symptoms. I will defer ischemic evaluation at this time. We will focus on lipid goals at this time. He takes ASA every other day due to bleeding/bruising problems. He is on lopressor 12.5 mg BID.  Spinal cord infarction ([place]) Pt had spinal infarction in 2006. Continue ASA and statin.    Fasting lipids in 6 weeks vs lipid clinic evaluation. See Dr. Stanford Breed in 6 months. Sooner if needed.   Medication Adjustments/Labs and Tests Ordered: Current medicines are reviewed at length with the patient today.  Concerns regarding medicines are outlined above.  Orders Placed This Encounter  Procedures  . Lipid panel  . EKG  12-Lead   Meds ordered this encounter  Medications  . rosuvastatin (CRESTOR) 40 MG tablet    Sig: Take 1 tablet (40 mg total) by mouth daily.    Dispense:  90 tablet    Refill:  1    Signed, Ledora Bottcher, [place]  12/26/2017 9:26 AM    [facility]

## 2017-12-26 ENCOUNTER — Ambulatory Visit: Payer: Medicare HMO | Admitting: Physician Assistant

## 2017-12-26 ENCOUNTER — Encounter: Payer: Self-pay | Admitting: Physician Assistant

## 2017-12-26 VITALS — BP 131/72 | HR 53 | Ht 69.0 in

## 2017-12-26 DIAGNOSIS — I10 Essential (primary) hypertension: Secondary | ICD-10-CM | POA: Diagnosis not present

## 2017-12-26 DIAGNOSIS — G9511 Acute infarction of spinal cord (embolic) (nonembolic): Secondary | ICD-10-CM | POA: Diagnosis not present

## 2017-12-26 DIAGNOSIS — I25119 Atherosclerotic heart disease of native coronary artery with unspecified angina pectoris: Secondary | ICD-10-CM

## 2017-12-26 DIAGNOSIS — E785 Hyperlipidemia, unspecified: Secondary | ICD-10-CM

## 2017-12-26 MED ORDER — ROSUVASTATIN CALCIUM 40 MG PO TABS: 40.0000 mg | ORAL_TABLET | Freq: Every day | ORAL | 1 refills | Status: DC

## 2017-12-26 NOTE — Patient Instructions (Addendum)
Medication Instructions: STOP the Simvastatin START Rosuvastatin (Crestor) 40 mg daily  If you need a refill on your cardiac medications before your next appointment, please call your pharmacy.   Labwork: Your provider would like for you to return in 6 weeks to have the following labs drawn: FASTING lipid. You do not need an appointment for the lab. Once in our office lobby there is a podium where you can sign in and ring the doorbell to alert [place] that you are here. The lab is open from 8:00 am to 4:30 pm; closed for lunch from 12:45pm-1:45pm.   Follow-Up: Your physician wants you to follow-up in 6 months with Dr. Stanford Breed. You will receive a reminder letter in the mail two months in advance. If you don't receive a letter, please call our office at [phone] to schedule this follow-up appointment.   Thank you for choosing [facility] at [facility]!!

## 2017-12-28 ENCOUNTER — Other Ambulatory Visit: Payer: Self-pay | Admitting: Family Medicine

## 2018-02-01 ENCOUNTER — Encounter: Payer: Self-pay | Admitting: Family Medicine

## 2018-02-03 ENCOUNTER — Other Ambulatory Visit: Payer: Self-pay

## 2018-02-03 ENCOUNTER — Other Ambulatory Visit: Payer: Self-pay | Admitting: Family Medicine

## 2018-02-03 DIAGNOSIS — E785 Hyperlipidemia, unspecified: Secondary | ICD-10-CM

## 2018-02-03 NOTE — Telephone Encounter (Signed)
Patient's wife called and scheduled his lipid panel for Monday since he has already ate today. She is asking for enough pills of simvastatin (ZOCOR) 40 MG tablet [[id number]] DISCONTINUED to get him until that appointment. Please Advise.

## 2018-02-03 NOTE — Telephone Encounter (Signed)
See note

## 2018-02-04 ENCOUNTER — Other Ambulatory Visit: Payer: Self-pay | Admitting: Family Medicine

## 2018-02-06 ENCOUNTER — Other Ambulatory Visit (INDEPENDENT_AMBULATORY_CARE_PROVIDER_SITE_OTHER): Payer: Medicare HMO

## 2018-02-06 DIAGNOSIS — E785 Hyperlipidemia, unspecified: Secondary | ICD-10-CM | POA: Diagnosis not present

## 2018-02-06 LAB — LIPID PANEL
CHOL/HDL RATIO: 3
Cholesterol: 139 mg/dL (ref 0–200)
HDL: 40.7 mg/dL (ref >39.00)
LDL Cholesterol: 72 mg/dL (ref 0–99)
NONHDL: 97.86
Triglycerides: 130 mg/dL (ref 0.0–149.0)
VLDL: 26 mg/dL (ref 0.0–40.0)

## 2018-02-07 ENCOUNTER — Telehealth: Payer: Self-pay | Admitting: *Deleted

## 2018-02-07 ENCOUNTER — Encounter: Payer: Self-pay | Admitting: Family Medicine

## 2018-02-07 NOTE — Telephone Encounter (Signed)
Copied from [place] [phone]. Topic: General - Other >> Feb 07, 2018  1:29 PM Antonieta Iba C wrote: Reason for CRM: pt's spouse is requesting a call back from [place]:   [phone] >> Feb 07, 2018  2:05 PM Yvette Rack wrote: Wife calling back wanting someone to call her cell phone they had to step out [phone]

## 2018-02-08 ENCOUNTER — Other Ambulatory Visit: Payer: Self-pay

## 2018-02-08 MED ORDER — SIMVASTATIN 40 MG PO TABS: 40.0000 mg | ORAL_TABLET | Freq: Every day | ORAL | 0 refills | Status: DC

## 2018-02-12 ENCOUNTER — Emergency Department (HOSPITAL_COMMUNITY)
Admission: EM | Admit: 2018-02-12 | Discharge: 2018-02-12 | Disposition: A | Discharge status: Home / Self Care | Payer: Medicare HMO | Attending: Emergency Medicine | Admitting: Emergency Medicine

## 2018-02-12 ENCOUNTER — Encounter (HOSPITAL_COMMUNITY): Payer: Self-pay | Admitting: Emergency Medicine

## 2018-02-12 ENCOUNTER — Emergency Department (HOSPITAL_COMMUNITY): Payer: Medicare HMO

## 2018-02-12 DIAGNOSIS — Z87891 Personal history of nicotine dependence: Secondary | ICD-10-CM | POA: Insufficient documentation

## 2018-02-12 DIAGNOSIS — R05 Cough: Secondary | ICD-10-CM | POA: Diagnosis not present

## 2018-02-12 DIAGNOSIS — Z7982 Long term (current) use of aspirin: Secondary | ICD-10-CM | POA: Insufficient documentation

## 2018-02-12 DIAGNOSIS — I2510 Atherosclerotic heart disease of native coronary artery without angina pectoris: Secondary | ICD-10-CM | POA: Diagnosis not present

## 2018-02-12 DIAGNOSIS — Z79899 Other long term (current) drug therapy: Secondary | ICD-10-CM | POA: Diagnosis not present

## 2018-02-12 DIAGNOSIS — I10 Essential (primary) hypertension: Secondary | ICD-10-CM | POA: Insufficient documentation

## 2018-02-12 DIAGNOSIS — J189 Pneumonia, unspecified organism: Secondary | ICD-10-CM | POA: Diagnosis not present

## 2018-02-12 DIAGNOSIS — R0902 Hypoxemia: Secondary | ICD-10-CM | POA: Diagnosis not present

## 2018-02-12 DIAGNOSIS — R509 Fever, unspecified: Secondary | ICD-10-CM | POA: Insufficient documentation

## 2018-02-12 LAB — CBC WITH DIFFERENTIAL/PLATELET
ABS IMMATURE GRANULOCYTES: 0.03 10*3/uL (ref 0.00–0.07)
BASOS ABS: 0 10*3/uL (ref 0.0–0.1)
Basophils Relative: 0 %
EOS ABS: 0.1 10*3/uL (ref 0.0–0.5)
Eosinophils Relative: 1 %
HEMATOCRIT: 39.9 % (ref 39.0–52.0)
HEMOGLOBIN: 12.4 g/dL — AB (ref 13.0–17.0)
IMMATURE GRANULOCYTES: 0 %
LYMPHS ABS: 0.5 10*3/uL — AB (ref 0.7–4.0)
LYMPHS PCT: 5 %
MCH: 27 pg (ref 26.0–34.0)
MCHC: 31.1 g/dL (ref 30.0–36.0)
MCV: 86.9 fL (ref 80.0–100.0)
Monocytes Absolute: 0.6 10*3/uL (ref 0.1–1.0)
Monocytes Relative: 6 %
NEUTROS PCT: 88 %
Neutro Abs: 9.5 10*3/uL — ABNORMAL HIGH (ref 1.7–7.7)
Platelets: 224 10*3/uL (ref 150–400)
RBC: 4.59 MIL/uL (ref 4.22–5.81)
RDW: 14.1 % (ref 11.5–15.5)
WBC: 10.7 10*3/uL — ABNORMAL HIGH (ref 4.0–10.5)
nRBC: 0 % (ref 0.0–0.2)

## 2018-02-12 LAB — COMPREHENSIVE METABOLIC PANEL
ALBUMIN: 3.7 g/dL (ref 3.5–5.0)
ALT: 17 U/L (ref 0–44)
AST: 17 U/L (ref 15–41)
Alkaline Phosphatase: 77 U/L (ref 38–126)
Anion gap: 10 (ref 5–15)
BILIRUBIN TOTAL: 0.5 mg/dL (ref 0.3–1.2)
BUN: 17 mg/dL (ref 8–23)
CO2: 20 mmol/L — AB (ref 22–32)
Calcium: 8.6 mg/dL — ABNORMAL LOW (ref 8.9–10.3)
Chloride: 104 mmol/L (ref 98–111)
Creatinine, Ser: 0.87 mg/dL (ref 0.61–1.24)
GFR calc Af Amer: >60 mL/min (ref >60)
GFR calc non Af Amer: >60 mL/min (ref >60)
GLUCOSE: 124 mg/dL — AB (ref 70–99)
POTASSIUM: 3.8 mmol/L (ref 3.5–5.1)
SODIUM: 134 mmol/L — AB (ref 135–145)
TOTAL PROTEIN: 6.4 g/dL — AB (ref 6.5–8.1)

## 2018-02-12 LAB — URINALYSIS, ROUTINE W REFLEX MICROSCOPIC
Bilirubin Urine: NEGATIVE
GLUCOSE, UA: NEGATIVE mg/dL
Ketones, ur: NEGATIVE mg/dL
NITRITE: NEGATIVE
PH: 5 (ref 5.0–8.0)
Protein, ur: NEGATIVE mg/dL
Specific Gravity, Urine: 1.024 (ref 1.005–1.030)

## 2018-02-12 LAB — PROTIME-INR
INR: 1.07
Prothrombin Time: 13.8 seconds (ref 11.4–15.2)

## 2018-02-12 LAB — I-STAT CG4 LACTIC ACID, ED: Lactic Acid, Venous: 1.8 mmol/L (ref 0.5–1.9)

## 2018-02-12 MED ORDER — CEFDINIR 300 MG PO CAPS
300.0000 mg | ORAL_CAPSULE | Freq: Once | ORAL | Status: AC
  Administered 2018-02-12: 300 mg via ORAL
  Filled 2018-02-12: qty 1

## 2018-02-12 MED ORDER — AZITHROMYCIN 250 MG PO TABS
500.0000 mg | ORAL_TABLET | Freq: Once | ORAL | Status: AC
  Administered 2018-02-12: 500 mg via ORAL
  Filled 2018-02-12: qty 2

## 2018-02-12 MED ORDER — AZITHROMYCIN 500 MG PO TABS: 500.0000 mg | ORAL_TABLET | Freq: Every day | ORAL | 0 refills | Status: AC

## 2018-02-12 MED ORDER — CEFDINIR 300 MG PO CAPS: 300.0000 mg | ORAL_CAPSULE | Freq: Two times a day (BID) | ORAL | 0 refills | Status: AC

## 2018-02-12 NOTE — Discharge Instructions (Signed)
Your work-up today was concerning for pneumonia given the low oxygen numbers, your cough, your coarse breath sounds, your fevers and chills.  The x-ray did not show pneumonia but clinically we are extremely concerned about pneumonia.  Please take both antibiotics at the pharmacy team recommended in light of your previous bacterial cultures.  Please take the Omnicef twice a day for the next 7 days and take the azithromycin once a day for the next 5 days.  This will also help cover if you have worsening urinary tract infection.  Please follow-up with your primary doctor in the next 24 to 48 hours, if any symptoms change or worsen whatsoever, please return immediately to the nearest emergency department for further management.  As we discussed in our shared decision-making conversation, your low oxygen numbers are concerning however we decided that given your overall well appearance and your wishes, we would attempt outpatient antibiotic treatment.

## 2018-02-12 NOTE — ED Triage Notes (Signed)
Pt c/o fever x 24 hours, up to almost 104 per wife. apap 1000mg  at 1600, 500mg  just PTA. Mucinex this am.  +cough with nasal drainage, chills, hoarseness with sore throat, pt self caths, pt denies urgency or pain.  A & O, RR appears increased in triage, per family pt does not appear shob, no reports of respiratory distress by patient.

## 2018-02-12 NOTE — ED Notes (Signed)
Pt preference to stay in wheelchair until absolutely necessary to move to bed

## 2018-02-12 NOTE — ED Notes (Signed)
Lab will add on urine culture  

## 2018-02-12 NOTE — ED Provider Notes (Signed)
[place] EMERGENCY DEPARTMENT Provider Note   CSN: [id number] Arrival date & time: 02/12/18  1947     History   Chief Complaint Chief Complaint  Patient presents with  . Fever    HPI Joseph Hayes is a 77 y.o. male.  The history is provided by the patient, the spouse, a relative and medical records.  Fever   This is a new problem. The current episode started yesterday. The problem occurs constantly. The problem has not changed since onset.The maximum temperature noted was more than 104 F. The temperature was taken using an oral thermometer. Associated symptoms include congestion and cough. Pertinent negatives include no chest pain, no diarrhea, no vomiting, no headaches and no tugging at ear. He has tried acetaminophen for the symptoms. The treatment provided moderate relief.    Past Medical History:  Diagnosis Date  . BPH (benign prostatic hypertrophy)   . Coronary artery disease   . ERECTILE DYSFUNCTION, ORGANIC 05/16/2007  . H/O: CVA (cardiovascular accident)   . Hiatal hernia    wheelchair bound stroke residual  . History of shingles 04/07/2009   Treated shingles 2010 s/p immunization      . Hyperlipidemia   . Hypertension   . MI, old   . SHINGLES 04/07/2009  . Stroke [facility]) 2006    Patient Active Problem List   Diagnosis Date Noted  . Recurrent UTI 05/17/2017  . Low vitamin D level 03/01/2017  . Eczema 07/10/2014  . Psoriasis and similar disorders 07/23/2013  . Vitamin B12 deficiency 10/17/2008  . CAD (coronary artery disease) 05/23/2008  . GERD 08/24/2007  . UTI (urinary tract infection) 01/09/2007  . Hyperlipidemia 01/05/2007  . Essential hypertension 01/05/2007  . Spinal cord infarction ([place]) 01/05/2007    Past Surgical History:  Procedure Laterality Date  . CARDIAC CATHETERIZATION    . CORONARY ANGIOPLASTY          Home Medications    Prior to Admission medications   Medication Sig Start Date End Date Taking? Authorizing  Provider  aspirin EC 81 MG tablet Take 81 mg by mouth every other day.    [provider]  folic acid (FOLVITE) 1 MG tablet TAKE 1 TABLET BY MOUTH EVERY DAY 12/28/17   Marin Olp, MD  meclizine (ANTIVERT) 25 MG tablet Take 1 tablet (25 mg total) by mouth 3 (three) times daily as needed. 07/10/14   Marin Olp, MD  metoprolol tartrate (LOPRESSOR) 25 MG tablet Take 0.5 tablets (12.5 mg total) by mouth 2 (two) times daily. 11/29/17   Marin Olp, MD  nitrofurantoin (MACRODANTIN) 100 MG capsule Take 100 mg by mouth at bedtime.    [provider]  omeprazole (PRILOSEC) 20 MG capsule Take 1 capsule (20 mg total) by mouth daily. 07/11/12   Regalado, Belkys A, MD  povidone-iodine 10 % swab Apply 1 application topically as needed. To use before self catheterization 05/17/17   Marin Olp, MD  rosuvastatin (CRESTOR) 40 MG tablet Take 1 tablet (40 mg total) by mouth daily. 12/26/17 03/26/18  Ledora Bottcher, PA  simvastatin (ZOCOR) 40 MG tablet Take 1 tablet (40 mg total) by mouth at bedtime. 02/08/18   Marin Olp, MD  triamcinolone cream (KENALOG) 0.1 % Apply 1 application topically 2 (two) times daily. For 7-10 days max with flare up 03/01/17   Marin Olp, MD  vitamin B-12 ([facility] VITAMIN B-12) 500 MCG tablet Take 1 tablet (500 mcg total) by mouth daily. 11/25/17  Marin Olp, MD    Family History Family History  Problem Relation Age of Onset  . Hypertension Unknown   . Alcohol abuse Unknown   . Hyperlipidemia Unknown   . Heart disease Father     Social History Social History   Tobacco Use  . Smoking status: Former Smoker    Last attempt to quit: 05/03/1974    Years since quitting: 43.8  . Smokeless tobacco: Former Network engineer Use Topics  . Alcohol use: No  . Drug use: No     Allergies   Patient has no known allergies.   Review of Systems Review of Systems  Constitutional: Positive for chills and fever. Negative for  diaphoresis and fatigue.  HENT: Positive for congestion.   Eyes: Negative for photophobia and visual disturbance.  Respiratory: Positive for cough. Negative for chest tightness, shortness of breath and wheezing.   Cardiovascular: Negative for chest pain and palpitations.  Gastrointestinal: Negative for abdominal pain, constipation, diarrhea, nausea and vomiting.  Genitourinary: Negative for dysuria and flank pain.  Musculoskeletal: Negative for back pain, neck pain and neck stiffness.  Skin: Negative for rash and wound.  Neurological: Negative for dizziness, light-headedness and headaches.  Psychiatric/Behavioral: Negative for agitation.  All other systems reviewed and are negative.    Physical Exam Updated Vital Signs BP 129/83 (BP Location: Right Arm)   Pulse 100   Temp (!) 102.3 F (39.1 C) (Oral)   Resp 16   Ht 5\' 9"  (1.753 m)   Wt 81.6 kg   SpO2 92%   BMI 26.58 kg/m   Physical Exam  Constitutional: He is oriented to person, place, and time. He appears well-developed and well-nourished. No distress.  HENT:  Head: Normocephalic and atraumatic.  Eyes: Pupils are equal, round, and reactive to light. Conjunctivae and EOM are normal.  Neck: Neck supple.  Cardiovascular: Regular rhythm. Tachycardia present.  No murmur heard. Pulmonary/Chest: Effort normal. No respiratory distress. He has no wheezes. He has rhonchi. He has no rales. He exhibits no tenderness.  Abdominal: Soft. He exhibits no distension. There is no tenderness.  Musculoskeletal: He exhibits no edema or tenderness.  Lymphadenopathy:    He has no cervical adenopathy.  Neurological: He is alert and oriented to person, place, and time. He exhibits abnormal muscle tone.  Skin: Skin is warm and dry. He is not diaphoretic. No erythema. No pallor.  Psychiatric: He has a normal mood and affect.  Nursing note and vitals reviewed.    ED Treatments / Results  Labs (all labs ordered are listed, but only abnormal  results are displayed) Labs Reviewed  COMPREHENSIVE METABOLIC PANEL - Abnormal; Notable for the following components:      Result Value   Sodium 134 (*)    CO2 20 (*)    Glucose, Bld 124 (*)    Calcium 8.6 (*)    Total Protein 6.4 (*)    All other components within normal limits  CBC WITH DIFFERENTIAL/PLATELET - Abnormal; Notable for the following components:   WBC 10.7 (*)    Hemoglobin 12.4 (*)    Neutro Abs 9.5 (*)    Lymphs Abs 0.5 (*)    All other components within normal limits  URINALYSIS, ROUTINE W REFLEX MICROSCOPIC - Abnormal; Notable for the following components:   APPearance HAZY (*)    Hgb urine dipstick SMALL (*)    Leukocytes, UA MODERATE (*)    Bacteria, UA RARE (*)    All other components within normal limits  URINE CULTURE  CULTURE, BLOOD (ROUTINE X 2)  CULTURE, BLOOD (ROUTINE X 2)  PROTIME-INR  I-STAT CG4 LACTIC ACID, ED  I-STAT CG4 LACTIC ACID, ED    EKG None  Radiology Dg Chest 2 View  Result Date: 02/12/2018 CLINICAL DATA:  Fever and cough for 24 hours. EXAM: CHEST - 2 VIEW COMPARISON:  None. FINDINGS: The heart size and mediastinal contours are within normal limits. Pulmonary hyperinflation is consistent with COPD. Both lungs are clear. The visualized skeletal structures are unremarkable. IMPRESSION: COPD.  No active cardiopulmonary disease. Electronically Signed   By: Earle Gell M.D.   On: 02/12/2018 20:50    Procedures Procedures (including critical care time)  Medications Ordered in ED Medications  azithromycin (ZITHROMAX) tablet 500 mg (500 mg Oral Given 02/12/18 2234)  cefdinir (OMNICEF) capsule 300 mg (300 mg Oral Given 02/12/18 2234)     Initial Impression / Assessment and Plan / ED Course  I have reviewed the triage vital signs and the nursing notes.  Pertinent labs & imaging results that were available during my care of the patient were reviewed by me and considered in my medical decision making (see chart for details).      Roc Streett Kopera is a 77 y.o. male with a past medical history significant for spinal infarction and subsequent quadriplegia, history of self-catheterization with numerous urinary tract infections on chronic nitrofurantoin for hypertension of lipidemia, GERD, CAD, and prior shingles who presents with fevers, chills, congestion, and cough.  Patient is calmly by family who reports that he started having URI symptoms yesterday.  They report he has had a nonproductive cough.  Patient also has had a mildly sore throat that has resolved.  He has had some congestion and rhinorrhea.  He denies any headache, neck pain, or neck stiffness.  No ear pain.  He denies any chest pain, abdominal pain, or urinary symptoms.  He denies any dysuria, hematuria or change in urine appearance.  He chronically self catheterizes for urine due to his quadriplegia and spinal injury.  Patient was found to have temperature of 104 at home and took Tylenol.  On exam, patient's breath sounds are rhonchorous and coarse.  Oxygen saturations are between 87 and 91% on room air.  Patient's tachycardia with a rate of just over 100 on arrival.  Chest nontender, abdomen nontender.  Patient denies any changes in his neurologic exam.  Given patient's tachycardia and fever, work-up was initiated to look for sources of infection.  With lack of nitrites on UA, have lower suspicion for UTI.  Patient is always on nitrofurantoin and with no urinary symptoms, doubt UTI as a source.  Suspect a viral URI versus pneumonia.  Given the coarse breath sounds, x-ray was obtained showing no pneumonia on imaging.  Patient's oxygen saturations remained in the upper 80s and low 90s on room air.  Given the patient's fever, coarse breath sounds on exam, cough, low O2,  and the mild leukocytosis, patient will be treated for likely diagnosis community associated pneumonia.  Pharmacy was called and his previous cultures were examined.  They recommended Omnicef and  azithromycin for coverage of community associated pneumonia as well as possible coverage for UTI.  A shared decision making conversation was held with patient and family and they understand that if the patient remains hypoxic, this could lead to worsening condition and even death.  They report that patient does not do well when he is admitted and they request a trial of outpatient management.  Do  not feel patient needs to leave AMA as his oxygen was above 90 on room air on reassessment however patient was instructed to return immediately if any symptoms change or worsen.  Patient and family understood plan of care and patient discharged in good condition.  Final Clinical Impressions(s) / ED Diagnoses   Final diagnoses:  Community acquired pneumonia, unspecified laterality  Fever, unspecified fever cause  Hypoxia    ED Discharge Orders         Ordered    azithromycin (ZITHROMAX) 500 MG tablet  Daily     02/12/18 2228    cefdinir (OMNICEF) 300 MG capsule  2 times daily     02/12/18 2228         Clinical Impression: 1. Community acquired pneumonia, unspecified laterality   2. Fever, unspecified fever cause   3. Hypoxia     Disposition: Discharge  Condition: Good  I have discussed the results, Dx and Tx plan with the pt(& family if present). He/she/they expressed understanding and agree(s) with the plan. Discharge instructions discussed at great length. Strict return precautions discussed and pt &/or family have verbalized understanding of the instructions. No further questions at time of discharge.    Discharge Medication List as of 02/12/2018 10:30 PM    START taking these medications   Details  azithromycin (ZITHROMAX) 500 MG tablet Take 1 tablet (500 mg total) by mouth daily for 5 days., Starting Mon 02/13/2018, Until Sat 02/18/2018, Print    cefdinir (OMNICEF) 300 MG capsule Take 1 capsule (300 mg total) by mouth 2 (two) times daily for 7 days., Starting Sun 02/12/2018,  Until Sun 02/19/2018, Print        Follow Up: Marin Olp, [place] [address] [phone]  In 1 day   [place] [address] [id number] mc [place]       Tegeler, Gwenyth Allegra, MD 02/13/18 [phone]

## 2018-02-12 NOTE — ED Notes (Signed)
Patient transported to X-ray 

## 2018-02-13 ENCOUNTER — Encounter: Payer: Self-pay | Admitting: Family Medicine

## 2018-02-13 ENCOUNTER — Ambulatory Visit (INDEPENDENT_AMBULATORY_CARE_PROVIDER_SITE_OTHER): Payer: Medicare HMO | Admitting: Family Medicine

## 2018-02-13 VITALS — BP 132/74 | HR 62 | Temp 97.9°F

## 2018-02-13 DIAGNOSIS — N3020 Other chronic cystitis without hematuria: Secondary | ICD-10-CM

## 2018-02-13 DIAGNOSIS — J189 Pneumonia, unspecified organism: Secondary | ICD-10-CM | POA: Diagnosis not present

## 2018-02-13 NOTE — Patient Instructions (Signed)
AVS not printed- we did discuss ASAP follow up for failure to improve or worsening symptoms

## 2018-02-13 NOTE — Progress Notes (Signed)
Subjective:  Joseph Hayes is a 77 y.o. year old very pleasant male patient who presents for/with See problem oriented charting ROS- cough, green sputum production. No more fever. No abdominal pain. No chest pain. Does not feel short of breath   Past Medical History-  Patient Active Problem List   Diagnosis Date Noted  . Recurrent UTI 05/17/2017    Priority: High  . CAD (coronary artery disease) 05/23/2008    Priority: High  . Spinal cord infarction ([place]) 01/05/2007    Priority: High  . Hyperlipidemia 01/05/2007    Priority: Medium  . Essential hypertension 01/05/2007    Priority: Medium  . Eczema 07/10/2014    Priority: Low  . Psoriasis and similar disorders 07/23/2013    Priority: Low  . Vitamin B12 deficiency 10/17/2008    Priority: Low  . GERD 08/24/2007    Priority: Low  . UTI (urinary tract infection) 01/09/2007    Priority: Low  . Low vitamin D level 03/01/2017    Medications- reviewed and updated Current Outpatient Medications  Medication Sig Dispense Refill  . acetaminophen (TYLENOL) 650 MG CR tablet Take 650 mg by mouth every 8 (eight) hours as needed for pain.    Marland Kitchen aspirin EC 81 MG tablet Take 81 mg by mouth every other day.    Marland Kitchen azithromycin (ZITHROMAX) 500 MG tablet Take 1 tablet (500 mg total) by mouth daily for 5 days. 5 tablet 0  . cefdinir (OMNICEF) 300 MG capsule Take 1 capsule (300 mg total) by mouth 2 (two) times daily for 7 days. 14 capsule 0  . folic acid (FOLVITE) 1 MG tablet TAKE 1 TABLET BY MOUTH EVERY DAY (Patient taking differently: Take 1 mg by mouth daily. ) 90 tablet 1  . meclizine (ANTIVERT) 25 MG tablet Take 1 tablet (25 mg total) by mouth 3 (three) times daily as needed. (Patient taking differently: Take 25 mg by mouth 3 (three) times daily as needed for dizziness. ) 30 tablet 5  . metoprolol tartrate (LOPRESSOR) 25 MG tablet Take 0.5 tablets (12.5 mg total) by mouth 2 (two) times daily. 90 tablet 3  . nitrofurantoin (MACRODANTIN) 100 MG  capsule Take 100 mg by mouth at bedtime.    Marland Kitchen omeprazole (PRILOSEC) 20 MG capsule Take 1 capsule (20 mg total) by mouth daily. 30 capsule 0  . povidone-iodine 10 % swab Apply 1 application topically as needed. To use before self catheterization 100 each 12  . simvastatin (ZOCOR) 40 MG tablet Take 1 tablet (40 mg total) by mouth at bedtime. 40 tablet 0  . triamcinolone cream (KENALOG) 0.1 % Apply 1 application topically 2 (two) times daily. For 7-10 days max with flare up (Patient taking differently: Apply 1 application topically daily as needed (for eczema). ) 454 g 1  . vitamin B-12 ([facility] VITAMIN B-12) 500 MCG tablet Take 1 tablet (500 mcg total) by mouth daily. 100 tablet 2  . rosuvastatin (CRESTOR) 40 MG tablet Take 1 tablet (40 mg total) by mouth daily. (Patient not taking: Reported on 02/13/2018) 90 tablet 1   Objective: BP 132/74 (BP Location: Left Arm, Patient Position: Sitting, Cuff Size: Large)   Pulse 62   Temp 97.9 F (36.6 C) (Oral)   SpO2 96%  Gen: NAD, resting comfortably CV: RRR no murmurs rubs or gallops Lungs: CTAB no crackles, wheeze, rhonchi- except for intermittent mild crackle in LLL Abdomen: soft/nontender/nondistended/normal bowel sounds. No rebound or guarding.  Ext: 1+ edema Skin: warm, dry Neuro: wheelchair bound, noted  paraplegia  Assessment/Plan:  CAP Chronic cystitis (do not strongly suspect acute UTI)  S: Patient was seen in ER last night after being found to have fever to 104 at home associated with congestion and cough though no shortness of breath or chest pain reported. Tylenol was tried and gave some relief of symptoms including reducing fever.   He had excellent and extensive workup in ER including UA (possible UTI but no nitrites), CXR, I stat lactic acid which wa snot elevated, cbc (mild anemia, high neutrophil count), cmp (calcium and protein somewhat low), blood cultures, urine culture.   Due to rhonchorous/coarse breath sounds, oxygen sats 87-91%,  tachycardia mild, fever, cough, congestion, leukocytosis- highest suspicion was for CAP. With chronic cystitis issues- concern for possible UTI. They spoke with pharmacy and planned for omnicef and azithromycin to give coverage for both. Patient opted with shared decision making to return home due to him not doing well in hospital in the past.   Since that time, patient reports improvement in symptoms. Has not had fever that high again but also taking tylenol pretty regularly. Still with some cough and green sputum but overall feels better.  A/P: I agree with likely CAP diagnosis- I listed to lungs today and no longer coarse breath sounds but did have some intermittent crackles in LLL. He is now satting in mid 63s and is afebrile- appears treatment is working - continue cefdinir and azithromycin - for now continue nitrofurantoin as well - I sent a message to patient's urologist Dr. Gloriann Loan for his opinion on continuing nitrofurantoin - due for flu shot but will hold off as acutely ill  Future Appointments  Date Time Provider [place]  03/14/2018  9:45 AM Marin Olp, MD [facility]   Return precautions advised.  Garret Reddish, MD

## 2018-02-14 LAB — URINE CULTURE

## 2018-02-17 LAB — CULTURE, BLOOD (ROUTINE X 2)
Culture: NO GROWTH
Culture: NO GROWTH

## 2018-02-25 ENCOUNTER — Encounter: Payer: Self-pay | Admitting: Family Medicine

## 2018-02-25 ENCOUNTER — Ambulatory Visit (INDEPENDENT_AMBULATORY_CARE_PROVIDER_SITE_OTHER): Payer: Medicare HMO | Admitting: Family Medicine

## 2018-02-25 ENCOUNTER — Telehealth: Payer: Self-pay | Admitting: Family Medicine

## 2018-02-25 VITALS — BP 120/70 | HR 96 | Temp 100.7°F

## 2018-02-25 DIAGNOSIS — R509 Fever, unspecified: Secondary | ICD-10-CM | POA: Diagnosis not present

## 2018-02-25 LAB — POC INFLUENZA A&B (BINAX/QUICKVUE)
Influenza A, POC: NEGATIVE
Influenza B, POC: POSITIVE — AB

## 2018-02-25 MED ORDER — OSELTAMIVIR PHOSPHATE 75 MG PO CAPS: 75.0000 mg | ORAL_CAPSULE | Freq: Two times a day (BID) | ORAL | 0 refills | Status: DC

## 2018-02-25 MED ORDER — ONDANSETRON HCL 4 MG PO TABS: 4.0000 mg | ORAL_TABLET | Freq: Three times a day (TID) | ORAL | 0 refills | Status: DC | PRN

## 2018-02-25 NOTE — Patient Instructions (Signed)
Follow up as needed or as scheduled START the Tamiflu as directed- twice daily LOTS of fluids REST! Alternate tylenol and ibuprofen every 4 hrs while awake If symptoms change or worsen, please let us know or go back to ER Call with any questions or concerns Hang in there!!!

## 2018-02-25 NOTE — Telephone Encounter (Signed)
Patient is having a lot of nausea. Can an Rx for the nausea be sent to [facility]?

## 2018-02-25 NOTE — Telephone Encounter (Signed)
Prescription for Zofran sent to pharmacy.  

## 2018-02-25 NOTE — Progress Notes (Signed)
   Subjective:    Patient ID: Joseph Hayes, male    DOB: 11/02/1940, 77 y.o.   MRN: [id number]  HPI Fever- pt was seen in ER on 10/13 w/ CAP and started on Omnicef and Azithro.  He was hypoxic at the time w/ rhonchi.  Pt reports he was feeling better and wife states that for the last week, he's been 'back to normal'.  This AM, pt developed fever and chills.  Pt has had 3 tylenol this AM and temp is still 100.7.  No cough.  No abd pain.  No skin sensitivity.  Mild body aches.  When chills are severe, will start dry heaving.  + sick contacts.   Review of Systems For ROS see HPI     Objective:   Physical Exam  Constitutional: He is oriented to person, place, and time. He appears well-developed and well-nourished. No distress.  Sitting in wheelchair  HENT:  Head: Normocephalic and atraumatic.  Right Ear: Tympanic membrane normal.  Left Ear: Tympanic membrane normal.  Nose: Right sinus exhibits no maxillary sinus tenderness and no frontal sinus tenderness. Left sinus exhibits no maxillary sinus tenderness and no frontal sinus tenderness.  Mouth/Throat: Oropharynx is clear and moist. No oropharyngeal exudate.  Neck: Normal range of motion. Neck supple.  Cardiovascular: Normal rate, regular rhythm and normal heart sounds.  Pulmonary/Chest: Effort normal and breath sounds normal. No respiratory distress. He has no wheezes. He has no rales.  Abdominal: Soft. Bowel sounds are normal. He exhibits no distension. There is no tenderness. There is no guarding.  Lymphadenopathy:    He has no cervical adenopathy.  Neurological: He is alert and oriented to person, place, and time.  Skin: Skin is warm. No rash noted.  Vitals reviewed.         Assessment & Plan:  Flu- pt w/ very faintly + Flu B test in office.  Currently his only sxs are fevers, chills.  No cough, denies change in urine when self cath'ing and is on abx as prophylaxis against recurrent UTIs.  Reports when the chills set in, he will  vomit and dry heave.  Start Tamiflu.  Alternate tylenol/ibuprofen for fever or body aches.  Reviewed supportive care and red flags that should prompt return.  Pt expressed understanding and is in agreement w/ plan.

## 2018-02-26 ENCOUNTER — Emergency Department (HOSPITAL_COMMUNITY)
Admission: EM | Admit: 2018-02-26 | Discharge: 2018-02-26 | Disposition: A | Discharge status: Home / Self Care | Payer: Medicare HMO | Attending: Emergency Medicine | Admitting: Emergency Medicine

## 2018-02-26 ENCOUNTER — Encounter (HOSPITAL_COMMUNITY): Payer: Self-pay | Admitting: Emergency Medicine

## 2018-02-26 ENCOUNTER — Emergency Department (HOSPITAL_COMMUNITY): Payer: Medicare HMO

## 2018-02-26 ENCOUNTER — Other Ambulatory Visit: Payer: Self-pay

## 2018-02-26 DIAGNOSIS — N5082 Scrotal pain: Secondary | ICD-10-CM | POA: Diagnosis present

## 2018-02-26 DIAGNOSIS — N453 Epididymo-orchitis: Secondary | ICD-10-CM | POA: Diagnosis not present

## 2018-02-26 DIAGNOSIS — K449 Diaphragmatic hernia without obstruction or gangrene: Secondary | ICD-10-CM | POA: Diagnosis not present

## 2018-02-26 DIAGNOSIS — Z79899 Other long term (current) drug therapy: Secondary | ICD-10-CM | POA: Diagnosis not present

## 2018-02-26 DIAGNOSIS — I10 Essential (primary) hypertension: Secondary | ICD-10-CM | POA: Diagnosis not present

## 2018-02-26 DIAGNOSIS — I2510 Atherosclerotic heart disease of native coronary artery without angina pectoris: Secondary | ICD-10-CM | POA: Diagnosis not present

## 2018-02-26 DIAGNOSIS — Z7982 Long term (current) use of aspirin: Secondary | ICD-10-CM | POA: Insufficient documentation

## 2018-02-26 DIAGNOSIS — N503 Cyst of epididymis: Secondary | ICD-10-CM | POA: Diagnosis not present

## 2018-02-26 DIAGNOSIS — Z87891 Personal history of nicotine dependence: Secondary | ICD-10-CM | POA: Insufficient documentation

## 2018-02-26 DIAGNOSIS — N5089 Other specified disorders of the male genital organs: Secondary | ICD-10-CM | POA: Diagnosis not present

## 2018-02-26 LAB — CBC WITH DIFFERENTIAL/PLATELET
Abs Immature Granulocytes: 0.11 10*3/uL — ABNORMAL HIGH (ref 0.00–0.07)
BASOS PCT: 0 %
Basophils Absolute: 0.1 10*3/uL (ref 0.0–0.1)
EOS ABS: 0.1 10*3/uL (ref 0.0–0.5)
EOS PCT: 1 %
HCT: 36.5 % — ABNORMAL LOW (ref 39.0–52.0)
HEMOGLOBIN: 11.8 g/dL — AB (ref 13.0–17.0)
Immature Granulocytes: 1 %
Lymphocytes Relative: 6 %
Lymphs Abs: 1.4 10*3/uL (ref 0.7–4.0)
MCH: 27.9 pg (ref 26.0–34.0)
MCHC: 32.3 g/dL (ref 30.0–36.0)
MCV: 86.3 fL (ref 80.0–100.0)
MONO ABS: 1.8 10*3/uL — AB (ref 0.1–1.0)
Monocytes Relative: 8 %
Neutro Abs: 19.9 10*3/uL — ABNORMAL HIGH (ref 1.7–7.7)
Neutrophils Relative %: 84 %
PLATELETS: 253 10*3/uL (ref 150–400)
RBC: 4.23 MIL/uL (ref 4.22–5.81)
RDW: 14.2 % (ref 11.5–15.5)
WBC: 23.4 10*3/uL — AB (ref 4.0–10.5)
nRBC: 0 % (ref 0.0–0.2)

## 2018-02-26 LAB — URINALYSIS, ROUTINE W REFLEX MICROSCOPIC
BILIRUBIN URINE: NEGATIVE
Glucose, UA: NEGATIVE mg/dL
KETONES UR: NEGATIVE mg/dL
Nitrite: NEGATIVE
PROTEIN: NEGATIVE mg/dL
Specific Gravity, Urine: 1.012 (ref 1.005–1.030)
pH: 6 (ref 5.0–8.0)

## 2018-02-26 LAB — BASIC METABOLIC PANEL
Anion gap: 8 (ref 5–15)
BUN: 17 mg/dL (ref 8–23)
CALCIUM: 8.8 mg/dL — AB (ref 8.9–10.3)
CHLORIDE: 106 mmol/L (ref 98–111)
CO2: 22 mmol/L (ref 22–32)
CREATININE: 0.94 mg/dL (ref 0.61–1.24)
GFR calc non Af Amer: >60 mL/min (ref >60)
Glucose, Bld: 90 mg/dL (ref 70–99)
Potassium: 4.2 mmol/L (ref 3.5–5.1)
SODIUM: 136 mmol/L (ref 135–145)

## 2018-02-26 MED ORDER — CEPHALEXIN 250 MG PO CAPS
1000.0000 mg | ORAL_CAPSULE | Freq: Once | ORAL | Status: AC
  Administered 2018-02-26: 1000 mg via ORAL
  Filled 2018-02-26: qty 4

## 2018-02-26 MED ORDER — CEPHALEXIN 500 MG PO CAPS: 500.0000 mg | ORAL_CAPSULE | Freq: Four times a day (QID) | ORAL | 0 refills | Status: DC

## 2018-02-26 NOTE — ED Triage Notes (Signed)
Wife stated, he has probably has a UTI. Recently had a UTI . Yesterday at Dr. stated he was border line flu. He has been running fever and his scrotum is swollen.

## 2018-02-26 NOTE — ED Provider Notes (Signed)
[facility] EMERGENCY DEPARTMENT Provider Note   CSN: [id number] Arrival date & time: 02/26/18  0277     History   Chief Complaint Chief Complaint  Patient presents with  . Urinary Tract Infection    HPI Joseph Hayes is a 77 y.o. male.  77 yo M with fevers and chills.  Going on since yesterday.  Went and saw his family physician who tested him positive for influenza B.  They stated that the line was very faintly colored by the time constraints.  Patient denies any cough or congestion.  Did not have any other symptoms really but this morning realize he was having some urinary symptoms and some scrotal pain and swelling.  They called their urologist who suggested that his urine get tested for infection.  He was recently on antibiotics since they did not want to start them empirically.  The patient denies any sick contacts.  Denies recent travel.  The history is provided by the patient.  Urinary Tract Infection   Pertinent negatives include no chills and no vomiting.  Illness  This is a new problem. The current episode started 2 days ago. The problem occurs constantly. The problem has been gradually worsening. Pertinent negatives include no chest pain, no abdominal pain, no headaches and no shortness of breath. Nothing aggravates the symptoms. Nothing relieves the symptoms. He has tried nothing for the symptoms. The treatment provided no relief.    Past Medical History:  Diagnosis Date  . BPH (benign prostatic hypertrophy)   . Coronary artery disease   . ERECTILE DYSFUNCTION, ORGANIC 05/16/2007  . H/O: CVA (cardiovascular accident)   . Hiatal hernia    wheelchair bound stroke residual  . History of shingles 04/07/2009   Treated shingles 2010 s/p immunization      . Hyperlipidemia   . Hypertension   . MI, old   . SHINGLES 04/07/2009  . Stroke [facility]) 2006    Patient Active Problem List   Diagnosis Date Noted  . Recurrent UTI 05/17/2017  . Low vitamin D  level 03/01/2017  . Eczema 07/10/2014  . Psoriasis and similar disorders 07/23/2013  . Vitamin B12 deficiency 10/17/2008  . CAD (coronary artery disease) 05/23/2008  . GERD 08/24/2007  . UTI (urinary tract infection) 01/09/2007  . Hyperlipidemia 01/05/2007  . Essential hypertension 01/05/2007  . Spinal cord infarction ([place]) 01/05/2007    Past Surgical History:  Procedure Laterality Date  . CARDIAC CATHETERIZATION    . CORONARY ANGIOPLASTY          Home Medications    Prior to Admission medications   Medication Sig Start Date End Date Taking? Authorizing Provider  acetaminophen (TYLENOL) 500 MG tablet Take 1,000 mg by mouth every 6 (six) hours as needed for mild pain.   Yes [provider]  aspirin EC 81 MG tablet Take 81 mg by mouth every other day.   Yes [provider]  folic acid (FOLVITE) 1 MG tablet TAKE 1 TABLET BY MOUTH EVERY DAY Patient taking differently: Take 1 mg by mouth daily.  12/28/17  Yes Marin Olp, MD  meclizine (ANTIVERT) 25 MG tablet Take 1 tablet (25 mg total) by mouth 3 (three) times daily as needed. Patient taking differently: Take 25 mg by mouth 3 (three) times daily as needed for dizziness.  07/10/14  Yes Marin Olp, MD  metoprolol tartrate (LOPRESSOR) 25 MG tablet Take 0.5 tablets (12.5 mg total) by mouth 2 (two) times daily. 11/29/17  Yes Hunter,  Brayton Mars, MD  nitrofurantoin (MACRODANTIN) 100 MG capsule Take 100 mg by mouth at bedtime.   Yes [provider]  omeprazole (PRILOSEC) 20 MG capsule Take 1 capsule (20 mg total) by mouth daily. 07/11/12  Yes Regalado, Belkys A, MD  oseltamivir (TAMIFLU) 75 MG capsule Take 1 capsule (75 mg total) by mouth 2 (two) times daily. 02/25/18  Yes Midge Minium, MD  simvastatin (ZOCOR) 40 MG tablet Take 1 tablet (40 mg total) by mouth at bedtime. 02/08/18  Yes Marin Olp, MD  triamcinolone cream (KENALOG) 0.1 % Apply 1 application topically 2 (two) times daily. For 7-10  days max with flare up Patient taking differently: Apply 1 application topically daily as needed (for eczema).  03/01/17  Yes Marin Olp, MD  vitamin B-12 ([employer] VITAMIN B-12) 500 MCG tablet Take 1 tablet (500 mcg total) by mouth daily. 11/25/17  Yes Marin Olp, MD  cephALEXin (KEFLEX) 500 MG capsule Take 1 capsule (500 mg total) by mouth 4 (four) times daily. 02/26/18   Deno Etienne, DO  ondansetron (ZOFRAN) 4 MG tablet Take 1 tablet (4 mg total) by mouth every 8 (eight) hours as needed for nausea or vomiting. Patient not taking: Reported on 02/26/2018 02/25/18   Midge Minium, MD  povidone-iodine 10 % swab Apply 1 application topically as needed. To use before self catheterization Patient not taking: Reported on 02/26/2018 05/17/17   Marin Olp, MD  rosuvastatin (CRESTOR) 40 MG tablet Take 1 tablet (40 mg total) by mouth daily. Patient not taking: Reported on 02/13/2018 12/26/17 03/26/18  Ledora Bottcher, PA    Family History Family History  Problem Relation Age of Onset  . Hypertension Unknown   . Alcohol abuse Unknown   . Hyperlipidemia Unknown   . Heart disease Father     Social History Social History   Tobacco Use  . Smoking status: Former Smoker    Last attempt to quit: 05/03/1974    Years since quitting: 43.8  . Smokeless tobacco: Former Network engineer Use Topics  . Alcohol use: No  . Drug use: No     Allergies   Patient has no known allergies.   Review of Systems Review of Systems  Constitutional: Negative for chills and fever.  HENT: Positive for facial swelling. Negative for congestion.   Eyes: Negative for discharge and visual disturbance.  Respiratory: Negative for shortness of breath.   Cardiovascular: Negative for chest pain and palpitations.  Gastrointestinal: Negative for abdominal pain, diarrhea and vomiting.  Genitourinary: Positive for scrotal swelling and testicular pain.  Musculoskeletal: Negative for arthralgias and  myalgias.  Skin: Negative for color change and rash.  Neurological: Negative for tremors, syncope and headaches.  Psychiatric/Behavioral: Negative for confusion and dysphoric mood.     Physical Exam Updated Vital Signs BP 124/74   Pulse 75   Temp 99.2 F (37.3 C) (Oral)   Resp (!) 31   SpO2 96%   Physical Exam  Constitutional: He is oriented to person, place, and time. He appears well-developed and well-nourished.  HENT:  Head: Normocephalic and atraumatic.  TM's normal.  No noted sinus drainage.  No lymphadenopathy.  Eyes: Pupils are equal, round, and reactive to light. EOM are normal.  Neck: Normal range of motion. Neck supple. No JVD present.  Cardiovascular: Normal rate and regular rhythm. Exam reveals no gallop and no friction rub.  No murmur heard. Pulmonary/Chest: No respiratory distress. He has no wheezes.  Abdominal: He exhibits no  distension. There is no rebound and no guarding.  Genitourinary:  Genitourinary Comments: Left-sided testicular pain and swelling.  Scrotal erythema.  No induration.  No fluctuance.  Musculoskeletal: Normal range of motion.  Neurological: He is alert and oriented to person, place, and time.  Skin: No rash noted. No pallor.  Psychiatric: He has a normal mood and affect. His behavior is normal.  Nursing note and vitals reviewed.    ED Treatments / Results  Labs (all labs ordered are listed, but only abnormal results are displayed) Labs Reviewed  URINALYSIS, ROUTINE W REFLEX MICROSCOPIC - Abnormal; Notable for the following components:      Result Value   Hgb urine dipstick SMALL (*)    Leukocytes, UA SMALL (*)    Bacteria, UA RARE (*)    All other components within normal limits  CBC WITH DIFFERENTIAL/PLATELET - Abnormal; Notable for the following components:   WBC 23.4 (*)    Hemoglobin 11.8 (*)    HCT 36.5 (*)    Neutro Abs 19.9 (*)    Monocytes Absolute 1.8 (*)    Abs Immature Granulocytes 0.11 (*)    All other components  within normal limits  BASIC METABOLIC PANEL - Abnormal; Notable for the following components:   Calcium 8.8 (*)    All other components within normal limits  URINE CULTURE    EKG None  Radiology Dg Chest 2 View  Result Date: 02/26/2018 CLINICAL DATA:  Wife stated, he has probably has a UTI. Recently had a UTI . Yesterday at Dr. stated he was border line flu. He has been running fever and his scrotum is swollen. EXAM: CHEST - 2 VIEW COMPARISON:  02/12/2018 FINDINGS: Cardiac silhouette is normal in size. No mediastinal hilar masses. Small hiatal hernia. Coronary artery stent visualized on the lateral view. No convincing adenopathy. Lungs are mildly hyperexpanded, but clear. No pleural effusion or pneumothorax. Skeletal structures are demineralized, but intact. IMPRESSION: No acute cardiopulmonary disease. Electronically Signed   By: Lajean Manes M.D.   On: 02/26/2018 12:23   US Scrotum W/doppler  Result Date: 02/26/2018 CLINICAL DATA:  Left scrotal swelling. EXAM: SCROTAL ULTRASOUND DOPPLER ULTRASOUND OF THE TESTICLES TECHNIQUE: Complete ultrasound examination of the testicles, epididymis, and other scrotal structures was performed. Color and spectral Doppler ultrasound were also utilized to evaluate blood flow to the testicles. COMPARISON:  None. FINDINGS: Right testicle Measurements: 4.4 x 2.3 x 3.2 cm. No mass or microlithiasis visualized. Left testicle Measurements: 4.1 x 3.0 x 3.3 cm. Mildly heterogeneous echogenicity. No mass focal lesion. No microlithiasis. Relative increased blood flow when compared to the right testicle. Right epididymis: Small epididymal head cyst. Otherwise unremarkable. Left epididymis: Small epididymal head cyst. Generalized increased vascularity. Heterogeneous echogenicity. Hydrocele:  None visualized. Varicocele:  None visualized. Pulsed Doppler interrogation of both testes demonstrates normal low resistance arterial and venous waveforms bilaterally. Generalized  overlying skin thickening. IMPRESSION: 1. Findings are consistent with left epididymitis/orchitis. 2. No testicular masses.  No torsion. Electronically Signed   By: Lajean Manes M.D.   On: 02/26/2018 12:21    Procedures Procedures (including critical care time)  Medications Ordered in ED Medications  cephALEXin (KEFLEX) capsule 1,000 mg (has no administration in time range)     Initial Impression / Assessment and Plan / ED Course  I have reviewed the triage vital signs and the nursing notes.  Pertinent labs & imaging results that were available during my care of the patient were reviewed by me and considered in my medical  decision making (see chart for details).     77 yo M with a chief complaint of fevers chills and left testicular swelling.  Patient is well-appearing and nontoxic.  Will obtain an evaluation looking for an infectious cause urine chest x-ray Doppler of the scrotum.  There has been a recent outbreak of mumps in the area though the patient does not of the age of the outbreak does not feel that he has been exposed to any such area or sick individual.   Patient has a large leukocytosis UA with many whites and rare bacteria metabolic panel with renal function at baseline chest x-ray reviewed by me without focal infiltrate  I discussed the case with Dr. Baxter Flattery, infectious disease she felt it would be unlikely that he would have months based on my history and physical.  She recommended treated instead for orchitis.  She felt that Keflex would be an okay option if his prior urine cultures have been sensitive.  Family is requesting discharge home.  We will have him follow-up with his urologist.  1:30 PM:  I have discussed the diagnosis/risks/treatment options with the patient and family and believe the pt to be eligible for discharge home to follow-up with Urology. We also discussed returning to the ED immediately if new or worsening sx occur. We discussed the sx which are most  concerning (e.g., sudden worsening pain, fever, inability to tolerate by mouth) that necessitate immediate return. Medications administered to the patient during their visit and any new prescriptions provided to the patient are listed below.  Medications given during this visit Medications  cephALEXin (KEFLEX) capsule 1,000 mg (has no administration in time range)      The patient appears reasonably screen and/or stabilized for discharge and I doubt any other medical condition or other [facility] requiring further screening, evaluation, or treatment in the ED at this time prior to discharge.    Final Clinical Impressions(s) / ED Diagnoses   Final diagnoses:  Orchitis and epididymitis    ED Discharge Orders         Ordered    cephALEXin (KEFLEX) 500 MG capsule  4 times daily     02/26/18 Dona Ana, Sylwia Cuervo, DO 02/26/18 1330

## 2018-02-27 ENCOUNTER — Encounter: Payer: Self-pay | Admitting: Family Medicine

## 2018-02-27 LAB — URINE CULTURE: Culture: NO GROWTH

## 2018-03-01 DIAGNOSIS — N453 Epididymo-orchitis: Secondary | ICD-10-CM | POA: Diagnosis not present

## 2018-03-01 DIAGNOSIS — N3020 Other chronic cystitis without hematuria: Secondary | ICD-10-CM | POA: Diagnosis not present

## 2018-03-01 DIAGNOSIS — N318 Other neuromuscular dysfunction of bladder: Secondary | ICD-10-CM | POA: Diagnosis not present

## 2018-03-02 ENCOUNTER — Other Ambulatory Visit: Payer: Self-pay | Admitting: Family Medicine

## 2018-03-14 ENCOUNTER — Ambulatory Visit (INDEPENDENT_AMBULATORY_CARE_PROVIDER_SITE_OTHER): Payer: Medicare HMO | Admitting: Family Medicine

## 2018-03-14 ENCOUNTER — Encounter: Payer: Self-pay | Admitting: Family Medicine

## 2018-03-14 VITALS — BP 118/70 | HR 62 | Temp 97.7°F | Ht 69.0 in | Wt 174.1 lb

## 2018-03-14 DIAGNOSIS — E538 Deficiency of other specified B group vitamins: Secondary | ICD-10-CM | POA: Diagnosis not present

## 2018-03-14 DIAGNOSIS — I10 Essential (primary) hypertension: Secondary | ICD-10-CM | POA: Diagnosis not present

## 2018-03-14 DIAGNOSIS — E785 Hyperlipidemia, unspecified: Secondary | ICD-10-CM | POA: Diagnosis not present

## 2018-03-14 DIAGNOSIS — R7989 Other specified abnormal findings of blood chemistry: Secondary | ICD-10-CM | POA: Diagnosis not present

## 2018-03-14 DIAGNOSIS — Z23 Encounter for immunization: Secondary | ICD-10-CM | POA: Diagnosis not present

## 2018-03-14 DIAGNOSIS — I2510 Atherosclerotic heart disease of native coronary artery without angina pectoris: Secondary | ICD-10-CM | POA: Diagnosis not present

## 2018-03-14 DIAGNOSIS — Z0000 Encounter for general adult medical examination without abnormal findings: Secondary | ICD-10-CM

## 2018-03-14 NOTE — Patient Instructions (Addendum)
Health Maintenance Due  Topic Date Due  . INFLUENZA VACCINE - thanks for doing your flu shot today 12/01/2017   Since you had bloodwork so recently- lets hold off today and repeat this in 6 months.   Happy to see you sooner if needed- but really hoping for a good stretch for you!

## 2018-03-14 NOTE — Progress Notes (Signed)
Phone: [phone]  Subjective:  Patient presents today for their annual physical. Chief complaint-noted.   See problem oriented charting- ROS- full  review of systems was completed and negative except for: wheelchair bound/poor mobility  The following were reviewed and entered/updated in epic: Past Medical History:  Diagnosis Date  . BPH (benign prostatic hypertrophy)   . Coronary artery disease   . ERECTILE DYSFUNCTION, ORGANIC 05/16/2007  . H/O: CVA (cardiovascular accident)   . Hiatal hernia    wheelchair bound stroke residual  . History of shingles 04/07/2009   Treated shingles 2010 s/p immunization      . Hyperlipidemia   . Hypertension   . MI, old   . SHINGLES 04/07/2009  . Stroke [facility]) 2006   Patient Active Problem List   Diagnosis Date Noted  . Recurrent UTI 05/17/2017    Priority: High  . CAD (coronary artery disease) 05/23/2008    Priority: High  . Spinal cord infarction ([place]) 01/05/2007    Priority: High  . Hyperlipidemia 01/05/2007    Priority: Medium  . Essential hypertension 01/05/2007    Priority: Medium  . Eczema 07/10/2014    Priority: Low  . Psoriasis and similar disorders 07/23/2013    Priority: Low  . Vitamin B12 deficiency 10/17/2008    Priority: Low  . GERD 08/24/2007    Priority: Low  . UTI (urinary tract infection) 01/09/2007    Priority: Low  . Low vitamin D level 03/01/2017   Past Surgical History:  Procedure Laterality Date  . CARDIAC CATHETERIZATION    . CORONARY ANGIOPLASTY      Family History  Problem Relation Age of Onset  . Hypertension Unknown   . Alcohol abuse Unknown   . Hyperlipidemia Unknown   . Heart disease Father     Medications- reviewed and updated Current Outpatient Medications  Medication Sig Dispense Refill  . acetaminophen (TYLENOL) 500 MG tablet Take 1,000 mg by mouth every 6 (six) hours as needed for mild pain.    Marland Kitchen aspirin EC 81 MG tablet Take 81 mg by mouth every other day.    . folic acid (FOLVITE)  1 MG tablet TAKE 1 TABLET BY MOUTH EVERY DAY (Patient taking differently: Take 1 mg by mouth daily. ) 90 tablet 1  . meclizine (ANTIVERT) 25 MG tablet Take 1 tablet (25 mg total) by mouth 3 (three) times daily as needed. (Patient taking differently: Take 25 mg by mouth 3 (three) times daily as needed for dizziness. ) 30 tablet 5  . metoprolol tartrate (LOPRESSOR) 25 MG tablet Take 0.5 tablets (12.5 mg total) by mouth 2 (two) times daily. 90 tablet 3  . nitrofurantoin (MACRODANTIN) 100 MG capsule Take 100 mg by mouth at bedtime.    Marland Kitchen omeprazole (PRILOSEC) 20 MG capsule Take 1 capsule (20 mg total) by mouth daily. 30 capsule 0  . povidone-iodine 10 % swab Apply 1 application topically as needed. To use before self catheterization 100 each 12  . simvastatin (ZOCOR) 40 MG tablet TAKE 1 TABLET BY MOUTH EVERYDAY AT BEDTIME 90 tablet 1  . triamcinolone cream (KENALOG) 0.1 % Apply 1 application topically 2 (two) times daily. For 7-10 days max with flare up (Patient taking differently: Apply 1 application topically daily as needed (for eczema). ) 454 g 1  . vitamin B-12 ([employer] VITAMIN B-12) 500 MCG tablet Take 1 tablet (500 mcg total) by mouth daily. 100 tablet 2  . ondansetron (ZOFRAN) 4 MG tablet Take 1 tablet (4 mg total) by mouth  every 8 (eight) hours as needed for nausea or vomiting. (Patient not taking: Reported on 03/14/2018) 30 tablet 0   No current facility-administered medications for this visit.     Allergies-reviewed and updated No Known Allergies  Social History   Social History Narrative   Married (wife patient of Dr. Yong Channel). 2 sons. 1 granddaughter (2001).       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 Packs over 1000 eggs a day.       Hobbies: time with wife and working at [employer].     Objective: BP 118/70 (BP Location: Left Arm, Patient Position: Sitting, Cuff Size: Large)   Pulse 62   Temp 97.7 F (36.5 C) (Oral)   Ht 5\' 9"  (1.753 m)   Wt 174 lb 1.6 oz  (79 kg)   SpO2 96%   BMI 25.71 kg/m   Gen: NAD, resting comfortably HEENT: Mucous membranes are moist. Oropharynx normal Neck: no thyromegaly CV: RRR no murmurs rubs or gallops Lungs: CTAB no crackles, wheeze, rhonchi Abdomen: soft/nontender/nondistended/normal bowel sounds. No rebound or guarding.  Ext: trace edema L > R Skin: warm, dry Neuro: grossly normal, moves all extremities, PERRLA  Assessment/Plan:  77 y.o. male presenting for annual physical.  Health Maintenance counseling: 1. Anticipatory guidance: Patient counseled regarding regular dental exams -q6 months- needs to get back on this, eye exams - no issues with vision, advised to update check up,  avoiding smoking and second hand smoke, limiting alcohol to 2 beverages per day - he drinks none.   2. Risk factor reduction:  Advised patient of need for regular exercise and diet rich and fruits and vegetables to reduce risk of heart attack and stroke. Exercise- stationary bike 1-2 days a week but making some loud noises so needs to have this evaluated, standing machine several times a day- can do up to an hour. Diet-balanced diet.  Wt Readings from Last 3 Encounters:  03/14/18 174 lb 1.6 oz (79 kg)  02/12/18 180 lb (81.6 kg)  10/02/12 180 lb (81.6 kg)  3. Immunizations/screenings/ancillary studies- flu shot today.  Immunization History  Administered Date(s) Administered  . Influenza Split 01/14/2011, 07/11/2012  . Influenza Whole 02/21/2008, 02/18/2009, 02/16/2010  . Influenza, High Dose Seasonal PF 03/05/2013, 03/01/2017, 03/14/2018  . Pneumococcal Conjugate-13 07/10/2014  . Pneumococcal Polysaccharide-23 10/01/2004, 07/11/2012  . Td 04/22/1993, 10/02/2012  . Zoster 03/05/2013  4. Prostate cancer screening- past age based screening recommendations, plus some complex health issues  5. Colon cancer screening - advised Cologuard last year (issue has hemorrhoids and gets bleeding so would likely be positive test), with co  morbidities he does not want to pursue colonoscopy so we opted out of cologuard 6. Skin cancer screening- dermatology visit in January. advised regular sunscreen use. Denies worrisome, changing, or new skin lesions other than small spot in left hairline- he will ask dermatology about this 7. former smoker- quit 1976. urines through urology  Status of chronic or acute concerns   Hypertension is controlled on metoprolol 12.5 mg twice a day  Hyperlipidemia- controlled reasonably well on simvastatin 40 mg with LDL 72 in last month- this is close to goal of 70 or less so w we will not adjust medication. He wonders if utting down on milk helped. Doing almond milk now.   Patient continues to follow-up with urology with Dr. Gloriann Loan for recurrent UTI due to  neurogenic bladder related this spinal cord infarction -Continues on chronic nitrofurantoin 100mg  at bedtime. Some dark urine  with that.  0 orchitis cleared up  Spinal cord infarction- patient with limited mobility and is wheelchair bound Stable-continue to monitor.  CAD- on aspirin, simvastatin, metoprolol  Low vitamin D-in the past.  Currently off vitamin D and trying to get sunshine.  Update vitamin D at 6 month visit  Took short break on b12- history of beng low- did this when on multiple meds. Seemed that swelling in legs improved off this so they may experiment with stopping thi sagain if recurds.   We agreed to hold off on labs given recent labs in hospital and lipid profile within 6 weeks. We will check CBC (mild anemia), CMP, vitamin D, LDL, b12 next visit  6 month follow up with labs at that time. Considering AWV in a few months  Lab/Order associations: Preventative health care  Need for prophylactic vaccination and inoculation against influenza - Plan: Flu vaccine HIGH DOSE PF  Coronary artery disease involving native coronary artery of native heart without angina pectoris  Vitamin B12 deficiency  Essential  hypertension  Hyperlipidemia, unspecified hyperlipidemia type  Low vitamin D level  Return precautions advised.  Garret Reddish, MD

## 2018-03-23 DIAGNOSIS — N3000 Acute cystitis without hematuria: Secondary | ICD-10-CM | POA: Diagnosis not present

## 2018-03-23 DIAGNOSIS — N453 Epididymo-orchitis: Secondary | ICD-10-CM | POA: Diagnosis not present

## 2018-04-03 DIAGNOSIS — Z87440 Personal history of urinary (tract) infections: Secondary | ICD-10-CM | POA: Diagnosis not present

## 2018-04-03 DIAGNOSIS — R339 Retention of urine, unspecified: Secondary | ICD-10-CM | POA: Diagnosis not present

## 2018-04-11 DIAGNOSIS — N390 Urinary tract infection, site not specified: Secondary | ICD-10-CM | POA: Diagnosis not present

## 2018-04-11 DIAGNOSIS — N453 Epididymo-orchitis: Secondary | ICD-10-CM | POA: Diagnosis not present

## 2018-04-18 DIAGNOSIS — N3020 Other chronic cystitis without hematuria: Secondary | ICD-10-CM | POA: Diagnosis not present

## 2018-04-18 DIAGNOSIS — N318 Other neuromuscular dysfunction of bladder: Secondary | ICD-10-CM | POA: Diagnosis not present

## 2018-04-18 DIAGNOSIS — N453 Epididymo-orchitis: Secondary | ICD-10-CM | POA: Diagnosis not present

## 2018-04-21 ENCOUNTER — Encounter: Payer: Self-pay | Admitting: Physician Assistant

## 2018-04-21 ENCOUNTER — Ambulatory Visit (INDEPENDENT_AMBULATORY_CARE_PROVIDER_SITE_OTHER): Payer: Medicare HMO

## 2018-04-21 ENCOUNTER — Ambulatory Visit (INDEPENDENT_AMBULATORY_CARE_PROVIDER_SITE_OTHER): Payer: Medicare HMO | Admitting: Physician Assistant

## 2018-04-21 VITALS — BP 168/90 | HR 76 | Temp 99.1°F

## 2018-04-21 DIAGNOSIS — R509 Fever, unspecified: Secondary | ICD-10-CM

## 2018-04-21 DIAGNOSIS — I10 Essential (primary) hypertension: Secondary | ICD-10-CM

## 2018-04-21 DIAGNOSIS — K449 Diaphragmatic hernia without obstruction or gangrene: Secondary | ICD-10-CM | POA: Diagnosis not present

## 2018-04-21 DIAGNOSIS — R600 Localized edema: Secondary | ICD-10-CM

## 2018-04-21 LAB — POCT URINALYSIS DIPSTICK
BILIRUBIN UA: NEGATIVE
Glucose, UA: NEGATIVE
Ketones, UA: NEGATIVE
Leukocytes, UA: NEGATIVE
Nitrite, UA: NEGATIVE
Protein, UA: NEGATIVE
Spec Grav, UA: >=1.03 — AB (ref 1.010–1.025)
Urobilinogen, UA: 1 E.U./dL
pH, UA: 5.5 (ref 5.0–8.0)

## 2018-04-21 LAB — COMPREHENSIVE METABOLIC PANEL
ALT: 11 U/L (ref 0–53)
AST: 12 U/L (ref 0–37)
Albumin: 4 g/dL (ref 3.5–5.2)
Alkaline Phosphatase: 86 U/L (ref 39–117)
BILIRUBIN TOTAL: 0.4 mg/dL (ref 0.2–1.2)
BUN: 17 mg/dL (ref 6–23)
CO2: 29 mEq/L (ref 19–32)
Calcium: 9 mg/dL (ref 8.4–10.5)
Chloride: 102 mEq/L (ref 96–112)
Creatinine, Ser: 0.82 mg/dL (ref 0.40–1.50)
GFR: 97.24 mL/min (ref >60.00)
Glucose, Bld: 103 mg/dL — ABNORMAL HIGH (ref 70–99)
Potassium: 4.2 mEq/L (ref 3.5–5.1)
Sodium: 137 mEq/L (ref 135–145)
Total Protein: 6.5 g/dL (ref 6.0–8.3)

## 2018-04-21 LAB — CBC WITH DIFFERENTIAL/PLATELET
Basophils Absolute: 0 10*3/uL (ref 0.0–0.1)
Basophils Relative: 0.4 % (ref 0.0–3.0)
Eosinophils Absolute: 0 10*3/uL (ref 0.0–0.7)
Eosinophils Relative: 0.4 % (ref 0.0–5.0)
HCT: 38.2 % — ABNORMAL LOW (ref 39.0–52.0)
Hemoglobin: 12.7 g/dL — ABNORMAL LOW (ref 13.0–17.0)
LYMPHS ABS: 0.9 10*3/uL (ref 0.7–4.0)
Lymphocytes Relative: 11.8 % — ABNORMAL LOW (ref 12.0–46.0)
MCHC: 33.3 g/dL (ref 30.0–36.0)
MCV: 84.9 fl (ref 78.0–100.0)
Monocytes Absolute: 1.1 10*3/uL — ABNORMAL HIGH (ref 0.1–1.0)
Monocytes Relative: 14.9 % — ABNORMAL HIGH (ref 3.0–12.0)
NEUTROS ABS: 5.4 10*3/uL (ref 1.4–7.7)
NEUTROS PCT: 72.5 % (ref 43.0–77.0)
Platelets: 213 10*3/uL (ref 150.0–400.0)
RBC: 4.5 Mil/uL (ref 4.22–5.81)
RDW: 14.1 % (ref 11.5–15.5)
WBC: 7.5 10*3/uL (ref 4.0–10.5)

## 2018-04-21 LAB — POC INFLUENZA A&B (BINAX/QUICKVUE)
INFLUENZA A, POC: NEGATIVE
Influenza B, POC: NEGATIVE

## 2018-04-21 MED ORDER — CEPHALEXIN 500 MG PO CAPS: 500.0000 mg | ORAL_CAPSULE | Freq: Three times a day (TID) | ORAL | 0 refills | Status: AC

## 2018-04-21 NOTE — Progress Notes (Signed)
Joseph Hayes is a 77 y.o. male here for a new problem.  I acted as a Education administrator for [employer], PA-C Anselmo Pickler, LPN  History of Present Illness:   Chief Complaint  Patient presents with  . Fever    Fever   This is a new problem. The current episode started yesterday. His temperature was unmeasured prior to arrival. Associated symptoms include headaches and nausea. Pertinent negatives include no congestion, coughing, diarrhea, sore throat or vomiting. Associated symptoms comments: Chills, Left foot edema. Treatments tried: Advil, started Macrobid last night.   Tuesday went to [facility] for follow-up of scrotal swelling. Was put on a few more days of levaquin but he did not continue the last two doses of this because it made him not sleep well. Usually takes daily UTI prophylactic of Macrobid 100 mg daily -- and he resumed last night (was told not to take while on levaquin.)   BP Readings from Last 3 Encounters:  04/21/18 (!) 168/90  03/14/18 118/70  02/26/18 129/62   He has had subjective fever. Wife states that patient has felt warm. States that his worst symptom is chills. Denies: chest pain, SOB, palpitations.  Has bilateral LE swelling at baseline, dependent edema from wheelchair use  Past Medical History:  Diagnosis Date  . BPH (benign prostatic hypertrophy)   . Coronary artery disease   . ERECTILE DYSFUNCTION, ORGANIC 05/16/2007  . H/O: CVA (cardiovascular accident)   . Hiatal hernia    wheelchair bound stroke residual  . History of shingles 04/07/2009   Treated shingles 2010 s/p immunization      . Hyperlipidemia   . Hypertension   . MI, old   . SHINGLES 04/07/2009  . Stroke [facility]) 2006     Social History   Socioeconomic History  . Marital status: Married    Spouse name: Not on file  . Number of children: Not on file  . Years of education: Not on file  . Highest education level: Not on file  Occupational History  . Occupation: self employed   Social Needs  . Financial resource strain: Not on file  . Food insecurity:    Worry: Not on file    Inability: Not on file  . Transportation needs:    Medical: Not on file    Non-medical: Not on file  Tobacco Use  . Smoking status: Former Smoker    Last attempt to quit: 05/03/1974    Years since quitting: 44.0  . Smokeless tobacco: Former Network engineer and Sexual Activity  . Alcohol use: No  . Drug use: No  . Sexual activity: Yes  Lifestyle  . Physical activity:    Days per week: Not on file    Minutes per session: Not on file  . Stress: Not on file  Relationships  . Social connections:    Talks on phone: Not on file    Gets together: Not on file    Attends religious service: Not on file    Active member of club or organization: Not on file    Attends meetings of clubs or organizations: Not on file    Relationship status: Not on file  . Intimate partner violence:    Fear of current or ex partner: Not on file    Emotionally abused: Not on file    Physically abused: Not on file    Forced sexual activity: Not on file  Other Topics Concern  . Not on file  Social History Narrative  Married (wife patient of Dr. Yong Channel). 2 sons. 1 granddaughter (2001).       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 Packs over 1000 eggs a day.       Hobbies: time with wife and working at [employer].     Past Surgical History:  Procedure Laterality Date  . CARDIAC CATHETERIZATION    . CORONARY ANGIOPLASTY      Family History  Problem Relation Age of Onset  . Hypertension Unknown   . Alcohol abuse Unknown   . Hyperlipidemia Unknown   . Heart disease Father     No Known Allergies  Current Medications:   Current Outpatient Medications:  .  acetaminophen (TYLENOL) 500 MG tablet, Take 1,000 mg by mouth every 6 (six) hours as needed for mild pain., Disp: , Rfl:  .  aspirin EC 81 MG tablet, Take 81 mg by mouth every other day., Disp: , Rfl:  .  folic acid  (FOLVITE) 1 MG tablet, TAKE 1 TABLET BY MOUTH EVERY DAY (Patient taking differently: Take 1 mg by mouth daily. ), Disp: 90 tablet, Rfl: 1 .  meclizine (ANTIVERT) 25 MG tablet, Take 1 tablet (25 mg total) by mouth 3 (three) times daily as needed. (Patient taking differently: Take 25 mg by mouth 3 (three) times daily as needed for dizziness. ), Disp: 30 tablet, Rfl: 5 .  metoprolol tartrate (LOPRESSOR) 25 MG tablet, Take 0.5 tablets (12.5 mg total) by mouth 2 (two) times daily., Disp: 90 tablet, Rfl: 3 .  nitrofurantoin (MACRODANTIN) 100 MG capsule, Take 100 mg by mouth at bedtime., Disp: , Rfl:  .  omeprazole (PRILOSEC) 20 MG capsule, Take 1 capsule (20 mg total) by mouth daily., Disp: 30 capsule, Rfl: 0 .  ondansetron (ZOFRAN) 4 MG tablet, Take 1 tablet (4 mg total) by mouth every 8 (eight) hours as needed for nausea or vomiting., Disp: 30 tablet, Rfl: 0 .  povidone-iodine 10 % swab, Apply 1 application topically as needed. To use before self catheterization, Disp: 100 each, Rfl: 12 .  simvastatin (ZOCOR) 40 MG tablet, TAKE 1 TABLET BY MOUTH EVERYDAY AT BEDTIME, Disp: 90 tablet, Rfl: 1 .  triamcinolone cream (KENALOG) 0.1 %, Apply 1 application topically 2 (two) times daily. For 7-10 days max with flare up (Patient taking differently: Apply 1 application topically daily as needed (for eczema). ), Disp: 454 g, Rfl: 1 .  vitamin B-12 (CVS VITAMIN B-12) 500 MCG tablet, Take 1 tablet (500 mcg total) by mouth daily., Disp: 100 tablet, Rfl: 2 .  cephALEXin (KEFLEX) 500 MG capsule, Take 1 capsule (500 mg total) by mouth 3 (three) times daily for 5 days., Disp: 15 capsule, Rfl: 0   Review of Systems:   Review of Systems  Constitutional: Positive for fever.  HENT: Negative for congestion and sore throat.   Respiratory: Negative for cough.   Gastrointestinal: Positive for nausea. Negative for diarrhea and vomiting.  Neurological: Positive for headaches.    Vitals:   Vitals:   04/21/18 1353 04/21/18  1424  BP: (!) 150/80 (!) 168/90  Pulse: 75 76  Temp: 99.1 F (37.3 C)   TempSrc: Oral   SpO2: 96%      There is no height or weight on file to calculate BMI.  Physical Exam:   Physical Exam Vitals signs and nursing note reviewed.  Constitutional:      General: He is not in acute distress.    Appearance: He is well-developed. He is not ill-appearing  or toxic-appearing.  HENT:     Head: Normocephalic and atraumatic.     Right Ear: Tympanic membrane, ear canal and external ear normal. Tympanic membrane is not erythematous, retracted or bulging.     Left Ear: Tympanic membrane, ear canal and external ear normal. Tympanic membrane is not erythematous, retracted or bulging.     Nose: Nose normal.     Right Sinus: No maxillary sinus tenderness or frontal sinus tenderness.     Left Sinus: No maxillary sinus tenderness or frontal sinus tenderness.     Mouth/Throat:     Pharynx: Uvula midline. No posterior oropharyngeal erythema.  Eyes:     General: Lids are normal.     Conjunctiva/sclera: Conjunctivae normal.  Neck:     Trachea: Trachea normal.  Cardiovascular:     Rate and Rhythm: Normal rate and regular rhythm.     Pulses: Normal pulses.     Heart sounds: Normal heart sounds, S1 normal and S2 normal.     Comments: No LE edema Pulmonary:     Effort: Pulmonary effort is normal.     Breath sounds: Normal breath sounds. No decreased breath sounds, wheezing, rhonchi or rales.  Musculoskeletal:     Comments: No evidence of bilateral calf swelling or erythema  Feet:     Comments: Bilateral feet with pitting edema. No erythema. L is just slightly larger than R. Lymphadenopathy:     Cervical: No cervical adenopathy.  Skin:    General: Skin is warm and dry.  Neurological:     Mental Status: He is alert.     GCS: GCS eye subscore is 4. GCS verbal subscore is 5. GCS motor subscore is 6.  Psychiatric:        Speech: Speech normal.        Behavior: Behavior normal. Behavior is  cooperative.     Results for orders placed or performed in visit on 04/21/18  CBC with Differential/Platelet  Result Value Ref Range   WBC 7.5 4.0 - 10.5 K/uL   RBC 4.50 4.22 - 5.81 Mil/uL   Hemoglobin 12.7 (L) 13.0 - 17.0 g/dL   HCT 38.2 (L) 39.0 - 52.0 %   MCV 84.9 78.0 - 100.0 fl   MCHC 33.3 30.0 - 36.0 g/dL   RDW 14.1 11.5 - 15.5 %   Platelets 213.0 150.0 - 400.0 K/uL   Neutrophils Relative % 72.5 43.0 - 77.0 %   Lymphocytes Relative 11.8 (L) 12.0 - 46.0 %   Monocytes Relative 14.9 (H) 3.0 - 12.0 %   Eosinophils Relative 0.4 0.0 - 5.0 %   Basophils Relative 0.4 0.0 - 3.0 %   Neutro Abs 5.4 1.4 - 7.7 K/uL   Lymphs Abs 0.9 0.7 - 4.0 K/uL   Monocytes Absolute 1.1 (H) 0.1 - 1.0 K/uL   Eosinophils Absolute 0.0 0.0 - 0.7 K/uL   Basophils Absolute 0.0 0.0 - 0.1 K/uL  Comprehensive metabolic panel  Result Value Ref Range   Sodium 137 135 - 145 mEq/L   Potassium 4.2 3.5 - 5.1 mEq/L   Chloride 102 96 - 112 mEq/L   CO2 29 19 - 32 mEq/L   Glucose, Bld 103 (H) 70 - 99 mg/dL   BUN 17 6 - 23 mg/dL   Creatinine, Ser 0.82 0.40 - 1.50 mg/dL   Total Bilirubin 0.4 0.2 - 1.2 mg/dL   Alkaline Phosphatase 86 39 - 117 U/L   AST 12 0 - 37 U/L   ALT 11 0 - 53  U/L   Total Protein 6.5 6.0 - 8.3 g/dL   Albumin 4.0 3.5 - 5.2 g/dL   Calcium 9.0 8.4 - 10.5 mg/dL   GFR 97.24 >60.00 mL/min  POC Influenza A&B(BINAX/QUICKVUE)  Result Value Ref Range   Influenza A, POC Negative Negative   Influenza B, POC Negative Negative  POCT urinalysis dipstick  Result Value Ref Range   Color, UA Yellow    Clarity, UA Clear    Glucose, UA Negative Negative   Bilirubin, UA Negative    Ketones, UA Negative    Spec Grav, UA >=1.030 (A) 1.010 - 1.025   Blood, UA Trace    pH, UA 5.5 5.0 - 8.0   Protein, UA Negative Negative   Urobilinogen, UA 1.0 0.2 or 1.0 E.U./dL   Nitrite, UA Negative    Leukocytes, UA Negative Negative   Appearance     Odor     CXR; IMPRESSION: 1. No acute cardiopulmonary  disease. 2. Stable chronic hyperinflation of both lungs. 3. Coronary artery disease. 4. Moderate-sized hiatal hernia.  EKG tracing is personally reviewed.  EKG notes NSR.  No acute changes.    Assessment and Plan:   Jaceion was seen today for fever.  Diagnoses and all orders for this visit:  Fever, unspecified fever cause Urinalysis and flu swab negative. CXR without PNA. Urine essentially negative however he has been on antibiotics, will send for culture and empirically treat with keflex in the meantime. CBC and CMP as well. If any worsening symptoms over the weekend, patient was instructed to go to the ER. -     POC Influenza A&B(BINAX/QUICKVUE) -     POCT urinalysis dipstick -     Urine Culture -     CBC with Differential/Platelet -     Comprehensive metabolic panel -     DG Chest 2 View; Future  Essential hypertension EKG reassuring. Possibly increased secondary to acute illness. Recommended checking blood pressures at home. EKG tracing is personally reviewed.  EKG notes NSR.  No acute changes. Follow-up with PCP within two weeks to revisit blood pressure, sooner if symptoms change. -     EKG 12-Lead -     CBC with Differential/Platelet -     Comprehensive metabolic panel -     DG Chest 2 View; Future  Bilateral lower extremity edema Per wife current issue is close to baseline. Will continue to monitor symptoms. Attempt to keep feet elevated. Follow-up with PCP within two weeks.  Other orders -     cephALEXin (KEFLEX) 500 MG capsule; Take 1 capsule (500 mg total) by mouth 3 (three) times daily for 5 days.  . Reviewed expectations re: course of current medical issues. . Discussed self-management of symptoms. . Outlined signs and symptoms indicating need for more acute intervention. . Patient verbalized understanding and all questions were answered. . See orders for this visit as documented in the electronic medical record. . Patient received an After-Visit Summary.  CMA  or LPN served as scribe during this visit. History, Physical, and Plan performed by medical provider. The above documentation has been reviewed and is accurate and complete.   Inda Coke, PA-C

## 2018-04-21 NOTE — Patient Instructions (Signed)
It was great to see you!  Start keflex to cover for possible infection.  We will be in touch with labs and urine culture results.  If any worsening symptoms over the weekend, go to the ER.  Take care,  Inda Coke PA-C

## 2018-04-22 LAB — URINE CULTURE
MICRO NUMBER:: 91526546
Result:: NO GROWTH
SPECIMEN QUALITY:: ADEQUATE

## 2018-04-24 ENCOUNTER — Ambulatory Visit: Payer: Self-pay | Admitting: *Deleted

## 2018-04-24 NOTE — Telephone Encounter (Signed)
Pt reports rash right foot, ankle and "Mid way up right leg." Onset Saturday. Denies any pain,no itching, no burning. States rash is dark red, small spots, not raised. States rash is "Splotchy."  Seen 04/21/18 by S. Worley for fever of unknown origin. Wife states pt had been on Levaquin for Orchitis. . Placed on Keflex 04/21/18. Course will be completed tomorrow. Also reports had been taking a lot of Advil, then tylenol. Denies any SOB, rash on right leg only. States "Feels better today and scrotum less swollen, but rash is new. Didn't know if he should be seen." Also states they are presently in [place] "If we need to come by." Please advise: [phone] Answer Assessment - Initial Assessment Questions 1. APPEARANCE of RASH: "Describe the rash."      Dark red, "Small spots, not raised." 2. LOCATION: "Where is the rash located?"      Right foot, ankle , mid way up right leg 3. NUMBER: "How many spots are there?"      "Splotchy." 4. SIZE: "How big are the spots?" (Inches, centimeters or compare to size of a coin)     Small, not raised 5. ONSET: "When did the rash start?"     Saturday 6. ITCHING: "Does the rash itch?" If so, ask: "How bad is the itch?"  (Scale 1-10; or mild, moderate, severe)     No itch, no pain, no burning 7. PAIN: "Does the rash hurt?" If so, ask: "How bad is the pain?"  (Scale 1-10; or mild, moderate, severe)     no 8. OTHER SYMPTOMS: "Do you have any other symptoms?" (e.g., fever)     no  Protocols used: RASH OR REDNESS - LOCALIZED-A-AH

## 2018-04-24 NOTE — Telephone Encounter (Signed)
See note

## 2018-04-24 NOTE — Telephone Encounter (Signed)
Attempted to contact pt; left message on voicemail [phone]; unable to complete nurse triage at this time

## 2018-04-28 NOTE — Telephone Encounter (Signed)
Called and spoke with patient who states his rash has almost disappeared. He states his other symptoms he was having have improved greatly. I advised him to call if he needed anything and he verbalized understanding

## 2018-05-10 ENCOUNTER — Other Ambulatory Visit: Payer: Self-pay

## 2018-05-10 MED ORDER — TRIAMCINOLONE ACETONIDE 0.1 % EX CREA: 1.0000 "application " | TOPICAL_CREAM | Freq: Two times a day (BID) | CUTANEOUS | 1 refills | Status: DC

## 2018-05-17 ENCOUNTER — Other Ambulatory Visit: Payer: Self-pay | Admitting: Family Medicine

## 2018-05-17 MED ORDER — MECLIZINE HCL 25 MG PO TABS: 25.0000 mg | ORAL_TABLET | Freq: Three times a day (TID) | ORAL | 5 refills | Status: DC | PRN

## 2018-05-17 NOTE — Telephone Encounter (Signed)
Copied from [place] [id number]. Topic: Quick Communication - Rx Refill/Question >> May 17, 2018  9:37 AM Sheran Luz wrote: Medication: meclizine (ANTIVERT) 25 MG tablet  Has the patient contacted their pharmacy? Yes, patient advised to contact office.   Preferred Pharmacy (with phone number or street name):[id number] [place], [place] [phone] (Phone) [phone] (Fax)

## 2018-05-17 NOTE — Telephone Encounter (Signed)
See note

## 2018-05-17 NOTE — Telephone Encounter (Signed)
Requested medication (s) are due for refill today - Rx over 76 year old  Requested medication (s) are on the active medication list -yes  Future visit scheduled -yes  Last refill: - written 3 years ago  Notes to clinic: Patient is requesting a refill on a non-delegated Rx. Rx written 3 years ago- sent for provider review.  Requested Prescriptions  Pending Prescriptions Disp Refills   meclizine (ANTIVERT) 25 MG tablet 30 tablet 5    Sig: Take 1 tablet (25 mg total) by mouth 3 (three) times daily as needed.     Not Delegated - Gastroenterology: Antiemetics Failed - 05/17/2018  9:41 AM      Failed - This refill cannot be delegated      Passed - Valid encounter within last 6 months    Recent Outpatient Visits          3 weeks ago Fever, unspecified fever cause   [facility] [place], [place], [place]   2 months ago Preventative health care   [place] Hunter, Brayton Mars, MD   2 months ago Fever and chills   [place] Tabori, Katherine E, MD   3 months ago Community acquired pneumonia, unspecified laterality   [place] Hunter, Brayton Mars, MD   8 months ago Coronary artery disease involving native coronary artery of native heart without angina pectoris   [place] Hunter, Brayton Mars, MD      Future Appointments            In 3 months Yong Channel, Brayton Mars, MD [facility] [place], [facility]            Requested Prescriptions  Pending Prescriptions Disp Refills   meclizine (ANTIVERT) 25 MG tablet 30 tablet 5    Sig: Take 1 tablet (25 mg total) by mouth 3 (three) times daily as needed.     Not Delegated - Gastroenterology: Antiemetics Failed - 05/17/2018  9:41 AM      Failed - This refill cannot be delegated      Passed - Valid encounter within last 6 months    Recent Outpatient Visits          3 weeks ago Fever, unspecified fever cause   [facility] [place], [place], [place]   2 months ago Preventative health care   [place] Hunter, Brayton Mars, MD   2 months ago Fever and chills   [place] Tabori, Katherine E, MD   3 months ago Community acquired pneumonia, unspecified laterality   [facility] [place], Brayton Mars, MD   8 months ago Coronary artery disease involving native coronary artery of native heart without angina pectoris   [place], Brayton Mars, MD      Future Appointments            In 3 months Yong Channel, Brayton Mars, MD [place], [facility]

## 2018-05-19 DIAGNOSIS — L2084 Intrinsic (allergic) eczema: Secondary | ICD-10-CM | POA: Diagnosis not present

## 2018-05-19 DIAGNOSIS — D229 Melanocytic nevi, unspecified: Secondary | ICD-10-CM | POA: Diagnosis not present

## 2018-05-19 DIAGNOSIS — L820 Inflamed seborrheic keratosis: Secondary | ICD-10-CM | POA: Diagnosis not present

## 2018-05-19 DIAGNOSIS — D1801 Hemangioma of skin and subcutaneous tissue: Secondary | ICD-10-CM | POA: Diagnosis not present

## 2018-05-19 DIAGNOSIS — L814 Other melanin hyperpigmentation: Secondary | ICD-10-CM | POA: Diagnosis not present

## 2018-05-19 DIAGNOSIS — D485 Neoplasm of uncertain behavior of skin: Secondary | ICD-10-CM | POA: Diagnosis not present

## 2018-05-19 DIAGNOSIS — L821 Other seborrheic keratosis: Secondary | ICD-10-CM | POA: Diagnosis not present

## 2018-06-23 ENCOUNTER — Other Ambulatory Visit: Payer: Self-pay | Admitting: Family Medicine

## 2018-07-04 DIAGNOSIS — Z87440 Personal history of urinary (tract) infections: Secondary | ICD-10-CM | POA: Diagnosis not present

## 2018-07-04 DIAGNOSIS — G9511 Acute infarction of spinal cord (embolic) (nonembolic): Secondary | ICD-10-CM | POA: Diagnosis not present

## 2018-07-04 DIAGNOSIS — R339 Retention of urine, unspecified: Secondary | ICD-10-CM | POA: Diagnosis not present

## 2018-08-22 ENCOUNTER — Other Ambulatory Visit: Payer: Self-pay | Admitting: Family Medicine

## 2018-08-22 NOTE — Telephone Encounter (Signed)
Last OV 03/14/18 Last refill 03/02/18 #90/1 Next OV 09/12/18

## 2018-08-23 ENCOUNTER — Telehealth: Payer: Self-pay | Admitting: *Deleted

## 2018-08-23 NOTE — Telephone Encounter (Signed)
08/23/18  Unable to leave a message, mail box is full @ 11:59 am.

## 2018-09-12 ENCOUNTER — Telehealth (INDEPENDENT_AMBULATORY_CARE_PROVIDER_SITE_OTHER): Payer: Medicare HMO | Admitting: Family Medicine

## 2018-09-12 ENCOUNTER — Encounter: Payer: Self-pay | Admitting: Family Medicine

## 2018-09-12 VITALS — Temp 95.8°F | Ht 69.0 in

## 2018-09-12 DIAGNOSIS — I2510 Atherosclerotic heart disease of native coronary artery without angina pectoris: Secondary | ICD-10-CM

## 2018-09-12 DIAGNOSIS — E785 Hyperlipidemia, unspecified: Secondary | ICD-10-CM | POA: Diagnosis not present

## 2018-09-12 DIAGNOSIS — K219 Gastro-esophageal reflux disease without esophagitis: Secondary | ICD-10-CM | POA: Diagnosis not present

## 2018-09-12 DIAGNOSIS — I10 Essential (primary) hypertension: Secondary | ICD-10-CM | POA: Diagnosis not present

## 2018-09-12 NOTE — Progress Notes (Signed)
Phone [phone]   Subjective:  Virtual visit via Video note. Chief complaint: Chief Complaint  Patient presents with  . Hypertension    6 month follow up    This visit type was conducted due to national recommendations for restrictions regarding the COVID-19 Pandemic (e.g. social distancing).  This format is felt to be most appropriate for this patient at this time balancing risks to patient and risks to population by having him in for in person visit.  No physical exam was performed (except for noted visual exam or audio findings with Telehealth visits).    Our team/I connected with Joseph Hayes on 09/12/18 at  9:40 AM EDT by a video enabled telemedicine application ([email or web address]) and verified that I am speaking with the correct person using two identifiers.  Location patient: [place] Location provider: [facility], office Persons participating in the virtual visit:  patient  Our team/I discussed the limitations of evaluation and management by telemedicine and the availability of in person appointments. In light of current covid-19 pandemic, patient also understands that we are trying to protect them by minimizing in office contact if at all possible.  The patient expressed consent for telemedicine visit and agreed to proceed. Patient understands insurance will be billed.   ROS- No chest pain or shortness of breath. No headache or blurry vision.  No fever/chills/cough.  Still wheelchair-bound  Past Medical History-  Patient Active Problem List   Diagnosis Date Noted  . Recurrent UTI 05/17/2017    Priority: High  . CAD (coronary artery disease) 05/23/2008    Priority: High  . Spinal cord infarction ([place]) 01/05/2007    Priority: High  . Hyperlipidemia 01/05/2007    Priority: Medium  . Essential hypertension 01/05/2007    Priority: Medium  . Eczema 07/10/2014    Priority: Low  . Psoriasis and similar disorders 07/23/2013    Priority: Low  . Vitamin B12 deficiency 10/17/2008   Priority: Low  . GERD 08/24/2007    Priority: Low  . UTI (urinary tract infection) 01/09/2007    Priority: Low  . Low vitamin D level 03/01/2017    Medications- reviewed and updated Current Outpatient Medications  Medication Sig Dispense Refill  . acetaminophen (TYLENOL) 500 MG tablet Take 1,000 mg by mouth every 6 (six) hours as needed for mild pain.    Marland Kitchen aspirin EC 81 MG tablet Take 81 mg by mouth every other day.    . folic acid (FOLVITE) 1 MG tablet TAKE 1 TABLET BY MOUTH EVERY DAY 90 tablet 1  . meclizine (ANTIVERT) 25 MG tablet Take 1 tablet (25 mg total) by mouth 3 (three) times daily as needed. 30 tablet 5  . metoprolol tartrate (LOPRESSOR) 25 MG tablet Take 0.5 tablets (12.5 mg total) by mouth 2 (two) times daily. 90 tablet 3  . nitrofurantoin (MACRODANTIN) 100 MG capsule Take 100 mg by mouth at bedtime.    Marland Kitchen omeprazole (PRILOSEC) 20 MG capsule Take 1 capsule (20 mg total) by mouth daily. 30 capsule 0  . ondansetron (ZOFRAN) 4 MG tablet Take 1 tablet (4 mg total) by mouth every 8 (eight) hours as needed for nausea or vomiting. 30 tablet 0  . simvastatin (ZOCOR) 40 MG tablet TAKE 1 TABLET BY MOUTH EVERYDAY AT BEDTIME 90 tablet 0  . triamcinolone cream (KENALOG) 0.1 % Apply 1 application topically 2 (two) times daily. For 7-10 days max with flare up 454 g 1  . vitamin B-12 ([facility] VITAMIN B-12) 500 MCG tablet Take 1 tablet (  500 mcg total) by mouth daily. 100 tablet 2   No current facility-administered medications for this visit.      Objective:  Temp (!) 95.8 F (35.4 C) (Oral)   Ht 5\' 9"  (1.753 m)   BMI 25.71 kg/m  self reported vitals Gen: NAD, resting comfortably Lungs: nonlabored, normal respiratory rate  Skin: appears dry, no obvious rash     Assessment and Plan   #hypertension S: controlled on last visit with [place] in office- December elevation was when he was ill. Still taking metoprolol 25mg  BID BP Readings from Last 3 Encounters:  04/21/18 (!) 168/90  03/14/18  118/70  02/26/18 129/62  A/P: I suspect good control based off last in person visit with me.  Though with last # being elevated when he was ill and saw another provider- we discussed trying to get a home cuff to check blood pressure and if elevated over 140/90 may need to make adjustment- he will reach out once he gets the #   # GERD S:compliant with omeprazole 20mg . No recent b12 check- does take b12 supplement though Lab Results  Component Value Date   VITAMINB12 620 03/01/2017  A/P:  Stable. Continue current medications.     #hyperlipidemia/CAD S: very close to ideal control on simvastatin 40mg - big decrease was from going to almond milk he states  With CAD- denies chest pain or shortness of breath.  Compliant with aspirin every other day. Lab Results  Component Value Date   CHOL 139 02/06/2018   HDL 40.70 02/06/2018   LDLCALC 72 02/06/2018   LDLDIRECT 106.0 08/30/2017   TRIG 130.0 02/06/2018   CHOLHDL 3 02/06/2018   A/P: Stable. Continue current medications. LDL at 72 and would like to be under 70- recheck in the fall likely and reassess at that time  Other notes: 1.get b12 level with next bloodwork though suspect b12 ok on supplement even with PPI.  Also get vitamin D 2.  Fortunately has been good on the UTI front recently-compliant with nitrofurantoin from urology  03/15/2019 or later for physical Lab/Order associations: Coronary artery disease involving native coronary artery of native heart without angina pectoris  Hyperlipidemia, unspecified hyperlipidemia type  Essential hypertension  Gastroesophageal reflux disease without esophagitis  Return precautions advised.  Garret Reddish, MD

## 2018-09-12 NOTE — Patient Instructions (Addendum)
There are no preventive care reminders to display for this patient.  Depression screen [facility] 2/9 03/14/2018 03/01/2017 06/04/2015  Decreased Interest 0 0 0  Down, Depressed, Hopeless 0 0 0  PHQ - 2 Score 0 0 0   Video visit- 7-month physical advised

## 2018-10-19 DIAGNOSIS — G9511 Acute infarction of spinal cord (embolic) (nonembolic): Secondary | ICD-10-CM | POA: Diagnosis not present

## 2018-10-19 DIAGNOSIS — R339 Retention of urine, unspecified: Secondary | ICD-10-CM | POA: Diagnosis not present

## 2018-10-19 DIAGNOSIS — Z87440 Personal history of urinary (tract) infections: Secondary | ICD-10-CM | POA: Diagnosis not present

## 2018-10-27 ENCOUNTER — Other Ambulatory Visit: Payer: Self-pay | Admitting: Family Medicine

## 2018-11-17 ENCOUNTER — Other Ambulatory Visit: Payer: Self-pay | Admitting: Family Medicine

## 2018-11-20 ENCOUNTER — Other Ambulatory Visit: Payer: Self-pay | Admitting: Family Medicine

## 2018-12-06 ENCOUNTER — Other Ambulatory Visit: Payer: Self-pay | Admitting: Family Medicine

## 2018-12-06 NOTE — Telephone Encounter (Signed)
Patient need to schedule an ov for more refills. 

## 2018-12-19 ENCOUNTER — Other Ambulatory Visit: Payer: Self-pay | Admitting: Family Medicine

## 2018-12-19 NOTE — Telephone Encounter (Signed)
Last OV 04/21/2018, needs OV. Refilled x 90 days.   Please contact pt to schedule.

## 2018-12-20 NOTE — Telephone Encounter (Signed)
lvm for patient to call back and schedule appt.

## 2019-01-04 ENCOUNTER — Other Ambulatory Visit: Payer: Self-pay | Admitting: Family Medicine

## 2019-02-07 DIAGNOSIS — G9511 Acute infarction of spinal cord (embolic) (nonembolic): Secondary | ICD-10-CM | POA: Diagnosis not present

## 2019-02-07 DIAGNOSIS — R339 Retention of urine, unspecified: Secondary | ICD-10-CM | POA: Diagnosis not present

## 2019-02-07 DIAGNOSIS — Z87440 Personal history of urinary (tract) infections: Secondary | ICD-10-CM | POA: Diagnosis not present

## 2019-04-05 ENCOUNTER — Ambulatory Visit (INDEPENDENT_AMBULATORY_CARE_PROVIDER_SITE_OTHER): Payer: Medicare HMO

## 2019-04-05 ENCOUNTER — Other Ambulatory Visit: Payer: Self-pay

## 2019-04-05 DIAGNOSIS — Z0000 Encounter for general adult medical examination without abnormal findings: Secondary | ICD-10-CM | POA: Diagnosis not present

## 2019-04-05 NOTE — Patient Instructions (Signed)
Joseph Hayes , Thank you for taking time to come for your Medicare Wellness Visit. I appreciate your ongoing commitment to your health goals. Please review the following plan we discussed and let me know if I can assist you in the future.   Screening recommendations/referrals: Colorectal Screening: Deferred  Vision and Dental Exams: Recommended annual ophthalmology exams for early detection of glaucoma and other disorders of the eye Recommended annual dental exams for proper oral hygiene  Vaccinations: Influenza vaccine: scheduled for 04/10/19 Pneumococcal vaccine: up to date; last 07/10/14 Tdap vaccine: up to date; last 10/02/12  Shingles vaccine: Please call your insurance company to determine your out of pocket expense for the Shingrix vaccine. You may receive this vaccine at your local pharmacy.  Advanced directives: Advance directives discussed with you today. I have provided a copy for you to complete at home and have notarized. Once this is complete please bring a copy in to our office so we can scan it into your chart.  Goals: Recommend to drink at least 6-8 8oz glasses of water per day and consume a balanced diet rich in fresh fruits and vegetables.   Next appointment: Please schedule your Annual Wellness Visit with your Nurse Health Advisor in one year.  Preventive Care 76 Years and Older, Male Preventive care refers to lifestyle choices and visits with your health care provider that can promote health and wellness. What does preventive care include?  A yearly physical exam. This is also called an annual well check.  Dental exams once or twice a year.  Routine eye exams. Ask your health care provider how often you should have your eyes checked.  Personal lifestyle choices, including:  Daily care of your teeth and gums.  Regular physical activity.  Eating a healthy diet.  Avoiding tobacco and drug use.  Limiting alcohol use.  Practicing safe sex.  Taking low doses of  aspirin every day if recommended by your health care provider..  Taking vitamin and mineral supplements as recommended by your health care provider. What happens during an annual well check? The services and screenings done by your health care provider during your annual well check will depend on your age, overall health, lifestyle risk factors, and family history of disease. Counseling  Your health care provider may ask you questions about your:  Alcohol use.  Tobacco use.  Drug use.  Emotional well-being.  Home and relationship well-being.  Sexual activity.  Eating habits.  History of falls.  Memory and ability to understand (cognition).  Work and work Statistician. Screening  You may have the following tests or measurements:  Height, weight, and BMI.  Blood pressure.  Lipid and cholesterol levels. These may be checked every 5 years, or more frequently if you are over 8 years old.  Skin check.  Lung cancer screening. You may have this screening every year starting at age 76 if you have a 30-pack-year history of smoking and currently smoke or have quit within the past 15 years.  Fecal occult blood test (FOBT) of the stool. You may have this test every year starting at age 76.  Flexible sigmoidoscopy or colonoscopy. You may have a sigmoidoscopy every 5 years or a colonoscopy every 10 years starting at age 76.  Prostate cancer screening. Recommendations will vary depending on your family history and other risks.  Hepatitis C blood test.  Hepatitis B blood test.  Sexually transmitted disease (STD) testing.  Diabetes screening. This is done by checking your blood sugar (glucose) after  you have not eaten for a while (fasting). You may have this done every 1-3 years.  Abdominal aortic aneurysm (AAA) screening. You may need this if you are a current or former smoker.  Osteoporosis. You may be screened starting at age 76 if you are at high risk. Talk with your health  care provider about your test results, treatment options, and if necessary, the need for more tests. Vaccines  Your health care provider may recommend certain vaccines, such as:  Influenza vaccine. This is recommended every year.  Tetanus, diphtheria, and acellular pertussis (Tdap, Td) vaccine. You may need a Td booster every 10 years.  Zoster vaccine. You may need this after age 25.  Pneumococcal 13-valent conjugate (PCV13) vaccine. One dose is recommended after age 76.  Pneumococcal polysaccharide (PPSV23) vaccine. One dose is recommended after age 45. Talk to your health care provider about which screenings and vaccines you need and how often you need them. This information is not intended to replace advice given to you by your health care provider. Make sure you discuss any questions you have with your health care provider. Document Released: 05/16/2015 Document Revised: 01/07/2016 Document Reviewed: 02/18/2015 Elsevier Interactive Patient Education  2017 [place] Prevention in the Home Falls can cause injuries. They can happen to people of all ages. There are many things you can do to make your home safe and to help prevent falls. What can I do on the outside of my home?  Regularly fix the edges of walkways and driveways and fix any cracks.  Remove anything that might make you trip as you walk through a door, such as a raised step or threshold.  Trim any bushes or trees on the path to your home.  Use bright outdoor lighting.  Clear any walking paths of anything that might make someone trip, such as rocks or tools.  Regularly check to see if handrails are loose or broken. Make sure that both sides of any steps have handrails.  Any raised decks and porches should have guardrails on the edges.  Have any leaves, snow, or ice cleared regularly.  Use sand or salt on walking paths during winter.  Clean up any spills in your garage right away. This includes oil or  grease spills. What can I do in the bathroom?  Use night lights.  Install grab bars by the toilet and in the tub and shower. Do not use towel bars as grab bars.  Use non-skid mats or decals in the tub or shower.  If you need to sit down in the shower, use a plastic, non-slip stool.  Keep the floor dry. Clean up any water that spills on the floor as soon as it happens.  Remove soap buildup in the tub or shower regularly.  Attach bath mats securely with double-sided non-slip rug tape.  Do not have throw rugs and other things on the floor that can make you trip. What can I do in the bedroom?  Use night lights.  Make sure that you have a light by your bed that is easy to reach.  Do not use any sheets or blankets that are too big for your bed. They should not hang down onto the floor.  Have a firm chair that has side arms. You can use this for support while you get dressed.  Do not have throw rugs and other things on the floor that can make you trip. What can I do in the kitchen?  Clean  up any spills right away.  Avoid walking on wet floors.  Keep items that you use a lot in easy-to-reach places.  If you need to reach something above you, use a strong step stool that has a grab bar.  Keep electrical cords out of the way.  Do not use floor polish or wax that makes floors slippery. If you must use wax, use non-skid floor wax.  Do not have throw rugs and other things on the floor that can make you trip. What can I do with my stairs?  Do not leave any items on the stairs.  Make sure that there are handrails on both sides of the stairs and use them. Fix handrails that are broken or loose. Make sure that handrails are as long as the stairways.  Check any carpeting to make sure that it is firmly attached to the stairs. Fix any carpet that is loose or worn.  Avoid having throw rugs at the top or bottom of the stairs. If you do have throw rugs, attach them to the floor with  carpet tape.  Make sure that you have a light switch at the top of the stairs and the bottom of the stairs. If you do not have them, ask someone to add them for you. What else can I do to help prevent falls?  Wear shoes that:  Do not have high heels.  Have rubber bottoms.  Are comfortable and fit you well.  Are closed at the toe. Do not wear sandals.  If you use a stepladder:  Make sure that it is fully opened. Do not climb a closed stepladder.  Make sure that both sides of the stepladder are locked into place.  Ask someone to hold it for you, if possible.  Clearly mark and make sure that you can see:  Any grab bars or handrails.  First and last steps.  Where the edge of each step is.  Use tools that help you move around (mobility aids) if they are needed. These include:  Canes.  Walkers.  Scooters.  Crutches.  Turn on the lights when you go into a dark area. Replace any light bulbs as soon as they burn out.  Set up your furniture so you have a clear path. Avoid moving your furniture around.  If any of your floors are uneven, fix them.  If there are any pets around you, be aware of where they are.  Review your medicines with your doctor. Some medicines can make you feel dizzy. This can increase your chance of falling. Ask your doctor what other things that you can do to help prevent falls. This information is not intended to replace advice given to you by your health care provider. Make sure you discuss any questions you have with your health care provider. Document Released: 02/13/2009 Document Revised: 09/25/2015 Document Reviewed: 05/24/2014 Elsevier Interactive Patient Education  2017 [employer].

## 2019-04-05 NOTE — Progress Notes (Signed)
This visit is being conducted via phone call due to the COVID-19 pandemic. This patient has given me verbal consent via phone to conduct this visit, patient states they are participating from their home address. Some vital signs may be absent or patient reported.   Patient identification: identified by name, DOB, and current address.  Location provider:  [facility], Office Persons participating in the virtual visit: Denman George LPN, spouse [facility]), and Dr. Garret Reddish     Subjective:   Joseph Hayes is a 78 y.o. male who presents for an Initial Medicare Annual Wellness Visit.  Review of Systems   Cardiac Risk Factors include: advanced age (>74men, >72 women);male gender;hypertension;dyslipidemia   Objective:    There were no vitals filed for this visit. There is no height or weight on file to calculate BMI.  Advanced Directives 04/05/2019 07/10/2012  Does Patient Have a Medical Advance Directive? No Patient does not have advance directive  Would patient like information on creating a medical advance directive? Yes (MAU/Ambulatory/Procedural Areas - Information given) -  Pre-existing out of facility DNR order (yellow form or pink MOST form) - No    Current Medications (verified) Outpatient Encounter Medications as of 04/05/2019  Medication Sig  . acetaminophen (TYLENOL) 500 MG tablet Take 1,000 mg by mouth every 6 (six) hours as needed for mild pain.  Marland Kitchen aspirin EC 81 MG tablet Take 81 mg by mouth every other day.  . [facility] B-12 500 MCG tablet TAKE 1 TABLET BY MOUTH DAILY  . folic acid (FOLVITE) 1 MG tablet TAKE 1 TABLET BY MOUTH EVERY DAY  . meclizine (ANTIVERT) 25 MG tablet Take 1 tablet (25 mg total) by mouth 3 (three) times daily as needed.  . metoprolol tartrate (LOPRESSOR) 25 MG tablet TAKE 0.5 TABLETS (12.5 MG TOTAL) BY MOUTH 2 (TWO) TIMES DAILY.  . nitrofurantoin (MACRODANTIN) 100 MG capsule Take 100 mg by mouth at bedtime.  Marland Kitchen omeprazole (PRILOSEC) 20 MG capsule  Take 1 capsule (20 mg total) by mouth daily.  . simvastatin (ZOCOR) 40 MG tablet TAKE 1 TABLET BY MOUTH EVERYDAY AT BEDTIME  . triamcinolone cream (KENALOG) 0.1 % APPLY 1 APPLICATION TOPICALLY 2 (TWO) TIMES DAILY. FOR 7-10 DAYS MAX WITH FLARE UP  . [DISCONTINUED] ondansetron (ZOFRAN) 4 MG tablet Take 1 tablet (4 mg total) by mouth every 8 (eight) hours as needed for nausea or vomiting.   No facility-administered encounter medications on file as of 04/05/2019.     Allergies (verified) Patient has no known allergies.   History: Past Medical History:  Diagnosis Date  . BPH (benign prostatic hypertrophy)   . Coronary artery disease   . ERECTILE DYSFUNCTION, ORGANIC 05/16/2007  . H/O: CVA (cardiovascular accident)   . Hiatal hernia    wheelchair bound stroke residual  . History of shingles 04/07/2009   Treated shingles 2010 s/p immunization      . Hyperlipidemia   . Hypertension   . MI, old   . SHINGLES 04/07/2009  . Stroke [facility]) 2006   Past Surgical History:  Procedure Laterality Date  . CARDIAC CATHETERIZATION    . CORONARY ANGIOPLASTY     Family History  Problem Relation Age of Onset  . Hypertension Unknown   . Alcohol abuse Unknown   . Hyperlipidemia Unknown   . Heart disease Father    Social History   Socioeconomic History  . Marital status: Married    Spouse name: Not on file  . Number of children: Not on file  .  Years of education: Not on file  . Highest education level: Not on file  Occupational History  . Occupation: self employed  Social Needs  . Financial resource strain: Not on file  . Food insecurity    Worry: Not on file    Inability: Not on file  . Transportation needs    Medical: Not on file    Non-medical: Not on file  Tobacco Use  . Smoking status: Former Smoker    Quit date: 05/03/1974    Years since quitting: 44.9  . Smokeless tobacco: Former Network engineer and Sexual Activity  . Alcohol use: No  . Drug use: No  . Sexual activity: Yes   Lifestyle  . Physical activity    Days per week: Not on file    Minutes per session: Not on file  . Stress: Not on file  Relationships  . Social Herbalist on phone: Not on file    Gets together: Not on file    Attends religious service: Not on file    Active member of club or organization: Not on file    Attends meetings of clubs or organizations: Not on file    Relationship status: Not on file  Other Topics Concern  . Not on file  Social History Narrative   Married (wife patient of Dr. Yong Channel). 2 sons. 1 granddaughter (2001).       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 Packs over 1000 eggs a day.       Hobbies: time with wife and working at [employer].    Tobacco Counseling Counseling given: Not Answered   Clinical Intake:  Pre-visit preparation completed: Yes  Pain : No/denies pain  Diabetes: No  How often do you need to have someone help you when you read instructions, pamphlets, or other written materials from your doctor or pharmacy?: 1 - Never  Interpreter Needed?: No  Information entered by :: Denman George LPN  Activities of Daily Living In your present state of health, do you have any difficulty performing the following activities: 04/05/2019  Hearing? N  Vision? N  Difficulty concentrating or making decisions? N  Walking or climbing stairs? Y  Dressing or bathing? Y  Doing errands, shopping? Y  Preparing Food and eating ? Y  Using the Toilet? Y  In the past six months, have you accidently leaked urine? Y  Do you have problems with loss of bowel control? Y  Managing your Medications? N  Managing your Finances? N  Housekeeping or managing your Housekeeping? N  Some recent data might be hidden     Immunizations and Health Maintenance Immunization History  Administered Date(s) Administered  . Influenza Split 01/14/2011, 07/11/2012  . Influenza Whole 02/21/2008, 02/18/2009, 02/16/2010  . Influenza, High Dose  Seasonal PF 03/05/2013, 03/01/2017, 03/14/2018  . Pneumococcal Conjugate-13 07/10/2014  . Pneumococcal Polysaccharide-23 10/01/2004, 07/11/2012  . Td 04/22/1993, 10/02/2012  . Zoster 03/05/2013   Health Maintenance Due  Topic Date Due  . INFLUENZA VACCINE  12/02/2018    Patient Care Team: Marin Olp, MD as PCP - General (Family Medicine) Stanford Breed Denice Bors, MD as PCP - Cardiology (Cardiology) [facility], [facility] (Urology) Macario Carls, MD as Consulting Physician (Dermatology) [facility] Breed Denice Bors, MD as Consulting Physician (Cardiology)  Indicate any recent Medical Services you may have received from other than [facility] providers in the past year (date may be approximate).    Assessment:   This is a  routine wellness examination for [facility].  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 0 Exclusion - non ambulatory No No Exclusion - non ambulatory  Injury with Fall? 0 - - - -  Risk for fall due to : Impaired mobility - - - -  Risk for fall due to: Comment - - - - -  Follow up Falls evaluation completed;Falls prevention discussed;Education provided - - - -    Is the patient's home free of loose throw rugs in walkways, pet beds, electrical cords, etc?   yes      Grab bars in the bathroom? yes      Handrails on the stairs?   yes      Adequate lighting?   yes   Cognitive Function: no cognitive concerns at this time  Cognitive Testing  Alert? Yes         Normal Appearance? n/a Oriented to  person? Yes           Place? Yes  Time? Yes  Recall of three objects? Yes  Can perform simple calculations? Yes  Displays appropriate judgment? Yes  Can read the correct time from a watch face? Yes   Screening Tests Health Maintenance  Topic Date Due  . INFLUENZA VACCINE  12/02/2018  . TETANUS/TDAP  10/03/2022  . PNA vac Low Risk Adult  Completed    Qualifies for Shingles Vaccine? Discussed and patient will check with pharmacy for coverage.  Patient education handout provided   Cancer Screenings: Lung: Low Dose CT Chest recommended if Age 57-80 years, 30 pack-year currently smoking OR have quit w/in 15years. Patient does not qualify. Colorectal: deferred due to mobility limitations     Plan:  I have personally reviewed and addressed the Medicare Annual Wellness questionnaire and have noted the following in the patient's chart:  A. Medical and social history B. Use of alcohol, tobacco or illicit drugs  C. Current medications and supplements D. Functional ability and status E.  Nutritional status F.  Physical activity G. Advance directives H. List of other physicians I.  Hospitalizations, surgeries, and ER visits in previous 12 months J.  [place] such as hearing and vision if needed, cognitive and depression L. Referrals, records requested, and appointments- none   In addition, I have reviewed and discussed with patient certain preventive protocols, quality metrics, and best practice recommendations. A written personalized care plan for preventive services as well as general preventive health recommendations were provided to patient.   Signed,  Denman George, LPN  Nurse Health Advisor   Nurse Notes: no additional

## 2019-04-07 ENCOUNTER — Other Ambulatory Visit: Payer: Self-pay | Admitting: Family Medicine

## 2019-04-09 ENCOUNTER — Other Ambulatory Visit: Payer: Self-pay

## 2019-04-10 ENCOUNTER — Ambulatory Visit (INDEPENDENT_AMBULATORY_CARE_PROVIDER_SITE_OTHER): Payer: Medicare HMO

## 2019-04-10 DIAGNOSIS — Z23 Encounter for immunization: Secondary | ICD-10-CM | POA: Diagnosis not present

## 2019-05-18 ENCOUNTER — Encounter: Payer: Self-pay | Admitting: Family Medicine

## 2019-05-18 DIAGNOSIS — Z87440 Personal history of urinary (tract) infections: Secondary | ICD-10-CM | POA: Diagnosis not present

## 2019-05-18 DIAGNOSIS — G9511 Acute infarction of spinal cord (embolic) (nonembolic): Secondary | ICD-10-CM | POA: Diagnosis not present

## 2019-05-18 DIAGNOSIS — R339 Retention of urine, unspecified: Secondary | ICD-10-CM | POA: Diagnosis not present

## 2019-07-04 ENCOUNTER — Other Ambulatory Visit: Payer: Self-pay | Admitting: Family Medicine

## 2019-07-13 ENCOUNTER — Telehealth: Payer: Self-pay | Admitting: Family Medicine

## 2019-07-13 NOTE — Telephone Encounter (Signed)
Sarah from [facility] called requesting order for PT/OT Eval and treat for new wheelchair. Please advise. Fax [phone]

## 2019-07-13 NOTE — Telephone Encounter (Signed)
Ok to give order? 

## 2019-07-13 NOTE — Telephone Encounter (Signed)
Yes thanks 

## 2019-07-17 ENCOUNTER — Other Ambulatory Visit: Payer: Self-pay

## 2019-07-17 DIAGNOSIS — G9511 Acute infarction of spinal cord (embolic) (nonembolic): Secondary | ICD-10-CM

## 2019-07-17 NOTE — Telephone Encounter (Signed)
Order placed and faxed to number provided.

## 2019-07-18 ENCOUNTER — Encounter: Payer: Self-pay | Admitting: Family Medicine

## 2019-07-18 ENCOUNTER — Ambulatory Visit (INDEPENDENT_AMBULATORY_CARE_PROVIDER_SITE_OTHER): Payer: Medicare Other | Admitting: Family Medicine

## 2019-07-18 ENCOUNTER — Other Ambulatory Visit: Payer: Self-pay

## 2019-07-18 VITALS — BP 136/84 | HR 55 | Temp 98.6°F | Ht 69.0 in | Wt 176.0 lb

## 2019-07-18 DIAGNOSIS — I2510 Atherosclerotic heart disease of native coronary artery without angina pectoris: Secondary | ICD-10-CM

## 2019-07-18 DIAGNOSIS — R7989 Other specified abnormal findings of blood chemistry: Secondary | ICD-10-CM

## 2019-07-18 DIAGNOSIS — I10 Essential (primary) hypertension: Secondary | ICD-10-CM

## 2019-07-18 DIAGNOSIS — E538 Deficiency of other specified B group vitamins: Secondary | ICD-10-CM | POA: Diagnosis not present

## 2019-07-18 DIAGNOSIS — G9511 Acute infarction of spinal cord (embolic) (nonembolic): Secondary | ICD-10-CM

## 2019-07-18 DIAGNOSIS — E785 Hyperlipidemia, unspecified: Secondary | ICD-10-CM

## 2019-07-18 LAB — COMPREHENSIVE METABOLIC PANEL
ALT: 20 U/L (ref 0–53)
AST: 16 U/L (ref 0–37)
Albumin: 4 g/dL (ref 3.5–5.2)
Alkaline Phosphatase: 92 U/L (ref 39–117)
BUN: 20 mg/dL (ref 6–23)
CO2: 29 mEq/L (ref 19–32)
Calcium: 9.2 mg/dL (ref 8.4–10.5)
Chloride: 103 mEq/L (ref 96–112)
Creatinine, Ser: 0.84 mg/dL (ref 0.40–1.50)
GFR: 88.68 mL/min (ref >60.00)
Glucose, Bld: 86 mg/dL (ref 70–99)
Potassium: 4.6 mEq/L (ref 3.5–5.1)
Sodium: 139 mEq/L (ref 135–145)
Total Bilirubin: 0.6 mg/dL (ref 0.2–1.2)
Total Protein: 6.5 g/dL (ref 6.0–8.3)

## 2019-07-18 LAB — LIPID PANEL
Cholesterol: 154 mg/dL (ref 0–200)
HDL: 44.9 mg/dL (ref >39.00)
LDL Cholesterol: 87 mg/dL (ref 0–99)
NonHDL: 109.32
Total CHOL/HDL Ratio: 3
Triglycerides: 112 mg/dL (ref 0.0–149.0)
VLDL: 22.4 mg/dL (ref 0.0–40.0)

## 2019-07-18 LAB — CBC WITH DIFFERENTIAL/PLATELET
Basophils Absolute: 0.1 10*3/uL (ref 0.0–0.1)
Basophils Relative: 1.1 % (ref 0.0–3.0)
Eosinophils Absolute: 0.3 10*3/uL (ref 0.0–0.7)
Eosinophils Relative: 6.2 % — ABNORMAL HIGH (ref 0.0–5.0)
HCT: 39.2 % (ref 39.0–52.0)
Hemoglobin: 12.9 g/dL — ABNORMAL LOW (ref 13.0–17.0)
Lymphocytes Relative: 25.1 % (ref 12.0–46.0)
Lymphs Abs: 1.4 10*3/uL (ref 0.7–4.0)
MCHC: 32.8 g/dL (ref 30.0–36.0)
MCV: 84.9 fl (ref 78.0–100.0)
Monocytes Absolute: 0.6 10*3/uL (ref 0.1–1.0)
Monocytes Relative: 10.4 % (ref 3.0–12.0)
Neutro Abs: 3.2 10*3/uL (ref 1.4–7.7)
Neutrophils Relative %: 57.2 % (ref 43.0–77.0)
Platelets: 224 10*3/uL (ref 150.0–400.0)
RBC: 4.62 Mil/uL (ref 4.22–5.81)
RDW: 14.5 % (ref 11.5–15.5)
WBC: 5.6 10*3/uL (ref 4.0–10.5)

## 2019-07-18 LAB — VITAMIN B12: Vitamin B-12: 535 pg/mL (ref 211–911)

## 2019-07-18 LAB — VITAMIN D 25 HYDROXY (VIT D DEFICIENCY, FRACTURES): VITD: 27.3 ng/mL — ABNORMAL LOW (ref 30.00–100.00)

## 2019-07-18 NOTE — Patient Instructions (Addendum)
Mobility assessment completed today. Let us know if you have any issues getting the power wheelchair and we will try to help   Would be reasonable to have a home cuff- would love if home #s <130/80. Even checking every few weeks would be ok  Please stop by lab before you go If you do not have mychart- we will call you about results within 5 business days of [place] receiving them.  If you have mychart- we will send your results within 3 business days of [place] receiving them.  If abnormal or we want to clarify a result, we will call or mychart you to make sure you receive the message.  If you have questions or concerns or don't hear within 5 business days, please send [place] a message or call us.    Recommended follow up: Return in about 6 months (around 01/18/2020) for physical or sooner if needed.

## 2019-07-18 NOTE — Progress Notes (Signed)
Phone [phone] In person visit   Subjective:   Joseph Hayes is a 79 y.o. year old very pleasant male patient who presents for/with See problem oriented charting Chief Complaint  Patient presents with  . Form Completion    for wheelchair     This visit occurred during the SARS-CoV-2 public health emergency.  Safety protocols were in place, including screening questions prior to the visit, additional usage of staff PPE, and extensive cleaning of exam room while observing appropriate contact time as indicated for disinfecting solutions.   Past Medical History-  Patient Active Problem List   Diagnosis Date Noted  . Recurrent UTI 05/17/2017    Priority: High  . CAD (coronary artery disease) 05/23/2008    Priority: High  . Spinal cord infarction ([place]) 01/05/2007    Priority: High  . Hyperlipidemia 01/05/2007    Priority: Medium  . Essential hypertension 01/05/2007    Priority: Medium  . Eczema 07/10/2014    Priority: Low  . Psoriasis and similar disorders 07/23/2013    Priority: Low  . Vitamin B12 deficiency 10/17/2008    Priority: Low  . GERD 08/24/2007    Priority: Low  . UTI (urinary tract infection) 01/09/2007    Priority: Low  . Low vitamin D level 03/01/2017    Medications- reviewed and updated Current Outpatient Medications  Medication Sig Dispense Refill  . acetaminophen (TYLENOL) 500 MG tablet Take 1,000 mg by mouth every 6 (six) hours as needed for mild pain.    Marland Kitchen aspirin EC 81 MG tablet Take 81 mg by mouth every other day.    . [facility] B-12 500 MCG tablet TAKE 1 TABLET BY MOUTH DAILY 123XX123 tablet 2  . folic acid (FOLVITE) 1 MG tablet TAKE 1 TABLET BY MOUTH EVERY DAY 90 tablet 1  . meclizine (ANTIVERT) 25 MG tablet Take 1 tablet (25 mg total) by mouth 3 (three) times daily as needed. 30 tablet 5  . metoprolol tartrate (LOPRESSOR) 25 MG tablet TAKE 0.5 TABLETS (12.5 MG TOTAL) BY MOUTH 2 (TWO) TIMES DAILY. 90 tablet 1  . nitrofurantoin (MACRODANTIN) 100 MG  capsule Take 100 mg by mouth at bedtime.    Marland Kitchen omeprazole (PRILOSEC) 20 MG capsule Take 1 capsule (20 mg total) by mouth daily. 30 capsule 0  . simvastatin (ZOCOR) 40 MG tablet TAKE 1 TABLET BY MOUTH EVERYDAY AT BEDTIME 90 tablet 1  . triamcinolone cream (KENALOG) 0.1 % APPLY 1 APPLICATION TOPICALLY 2 (TWO) TIMES DAILY. FOR 7-10 DAYS MAX WITH FLARE UP 454 g 1   No current facility-administered medications for this visit.     Objective:  BP 136/84   Pulse (!) 55   Temp 98.6 F (37 C) (Temporal)   Ht 5\' 9"  (1.753 m)   Wt 176 lb (79.8 kg)   SpO2 97%   BMI 25.99 kg/m  Gen: NAD, resting comfortably CV: RRR no murmurs rubs or gallops Lungs: CTAB no crackles, wheeze, rhonchi Ext: no edema Skin: warm, dry Neuro: 2/5 strength in right and left lower extremity. Unable to grip may hands. At bicep has 4/5 strength but not able to use hands as noted. Unable to stand.     Assessment and Plan  # Mobility assessment / spinal cord infarction S:Patient needs new order for power wheel chair and manual chair.   Visit today is serving as face to face. The primary reason for this visit is for mobility examination and determination of needs.   Patient with history of spinal cord  infarction in 2006. Had prolonged rehab at [facility] in [place]. This spans from C4- T1. He has been wheelchair bound since this time due to quadriplegic.  He cannot use manual wheelchair for everything he needs to do inside thehome due to upper extremity weakness. For example unable to tranfer as primary barrier and requires full time assistance. He is unable to bathe/dress/toilet/clothe himself. Cane or walker would be inadequate due to spinal cord infarction and both upper and lower etremity weakness. He has the physical and mental ability to operate a power mobility device safely in the home.   They would also like to have a manual wheelchair as backup in case the power wheelchair malfunctions which is very reasonable.  Has a prior power wheelchair from 2012 which they have had to have a lot of work done on it and it has not been reliable.  A/P: 79 year old male with spinal cord infraction C4-T1 limiting his mobility- we are writing for both a power and manual wheelchair today. See assessment above   #hypertension S: compliant with  metoprolol 12.5mg  BID BP Readings from Last 3 Encounters:  07/18/19 136/84  04/21/18 (!) 168/90  03/14/18 118/70  A/P: stable on repeat but would love for this to be lower perhaps 130/80 or less- they will do some home monitoring  #hyperlipidemia/CAD S: compliant with simvastatin 40mg . 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 Rides bike every other day and no issues. 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 Possible change to rosuvastatin 20mg  if #s still slightly high  Cad asymptomatic- continue current medicines  Recommended follow up: Return in about 6 months (around 01/18/2020) for physical or sooner if needed.  Lab/Order associations:   ICD-10-CM   1. Spinal cord infarction [facility])  G95.11 DME Wheelchair manual    DME Wheelchair electric  2. Hyperlipidemia, unspecified hyperlipidemia type  E78.5 CBC with Differential/Platelet    Comprehensive metabolic panel    Lipid panel  3. Essential hypertension  I10 CBC with Differential/Platelet    Comprehensive metabolic panel    Lipid panel  4. Coronary artery disease involving native coronary artery of native heart without angina pectoris  I25.10   5. Vitamin B12 deficiency  E53.8 Vitamin B12  6. Low vitamin D level  R79.89 VITAMIN D 25 Hydroxy (Vit-D Deficiency, Fractures)    Return precautions advised.  Garret Reddish, MD

## 2019-07-28 ENCOUNTER — Other Ambulatory Visit: Payer: Self-pay | Admitting: Family Medicine

## 2019-08-14 DIAGNOSIS — N3020 Other chronic cystitis without hematuria: Secondary | ICD-10-CM | POA: Diagnosis not present

## 2019-08-15 ENCOUNTER — Encounter: Payer: Self-pay | Admitting: General Practice

## 2019-08-23 IMAGING — CR DG CHEST 2V
2 series · 2 of 2 positions shown · non-contrast
Comparison: None.

CLINICAL DATA: Fever and cough for 24 hours.

EXAM:
CHEST - 2 VIEW

[chest lat]
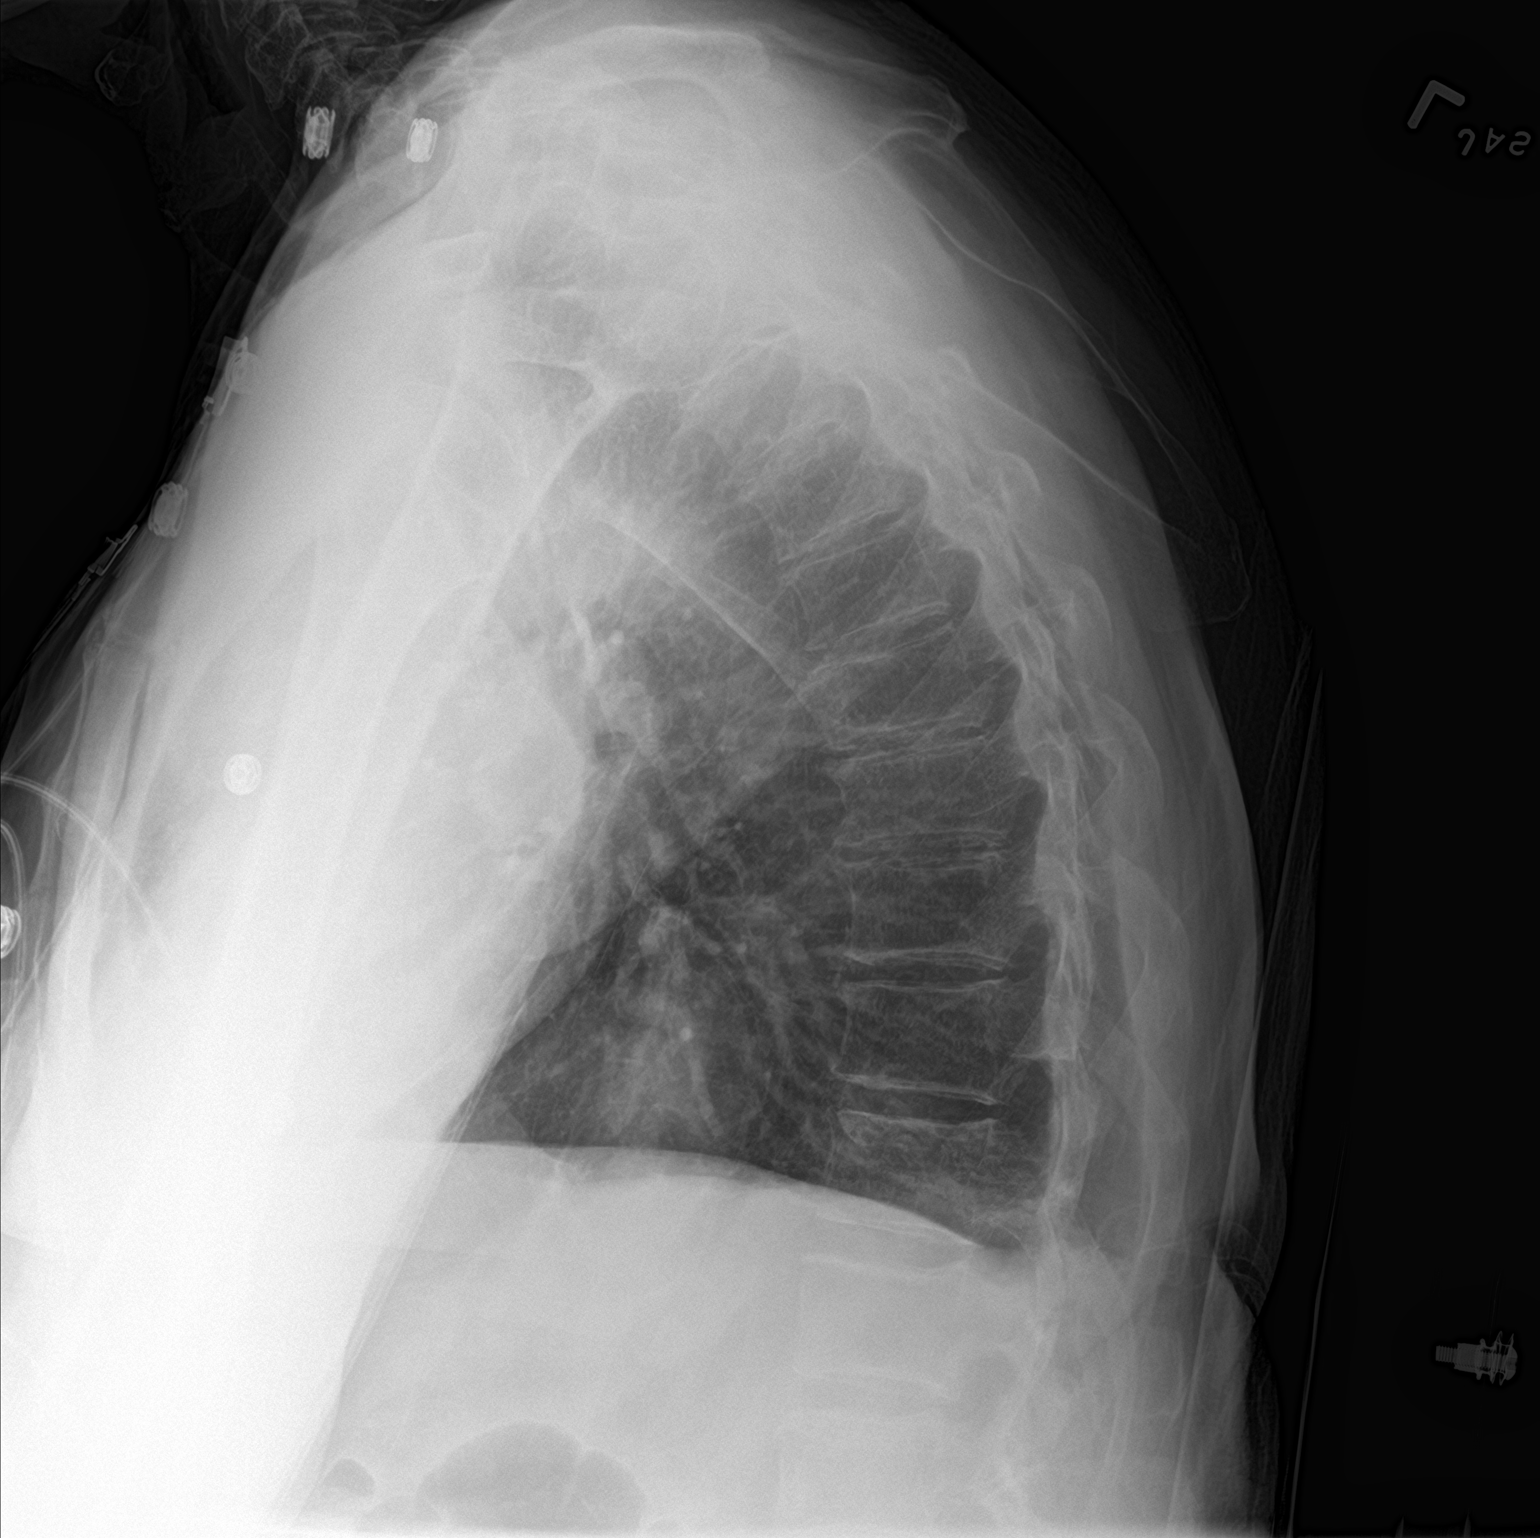

[chest ap]
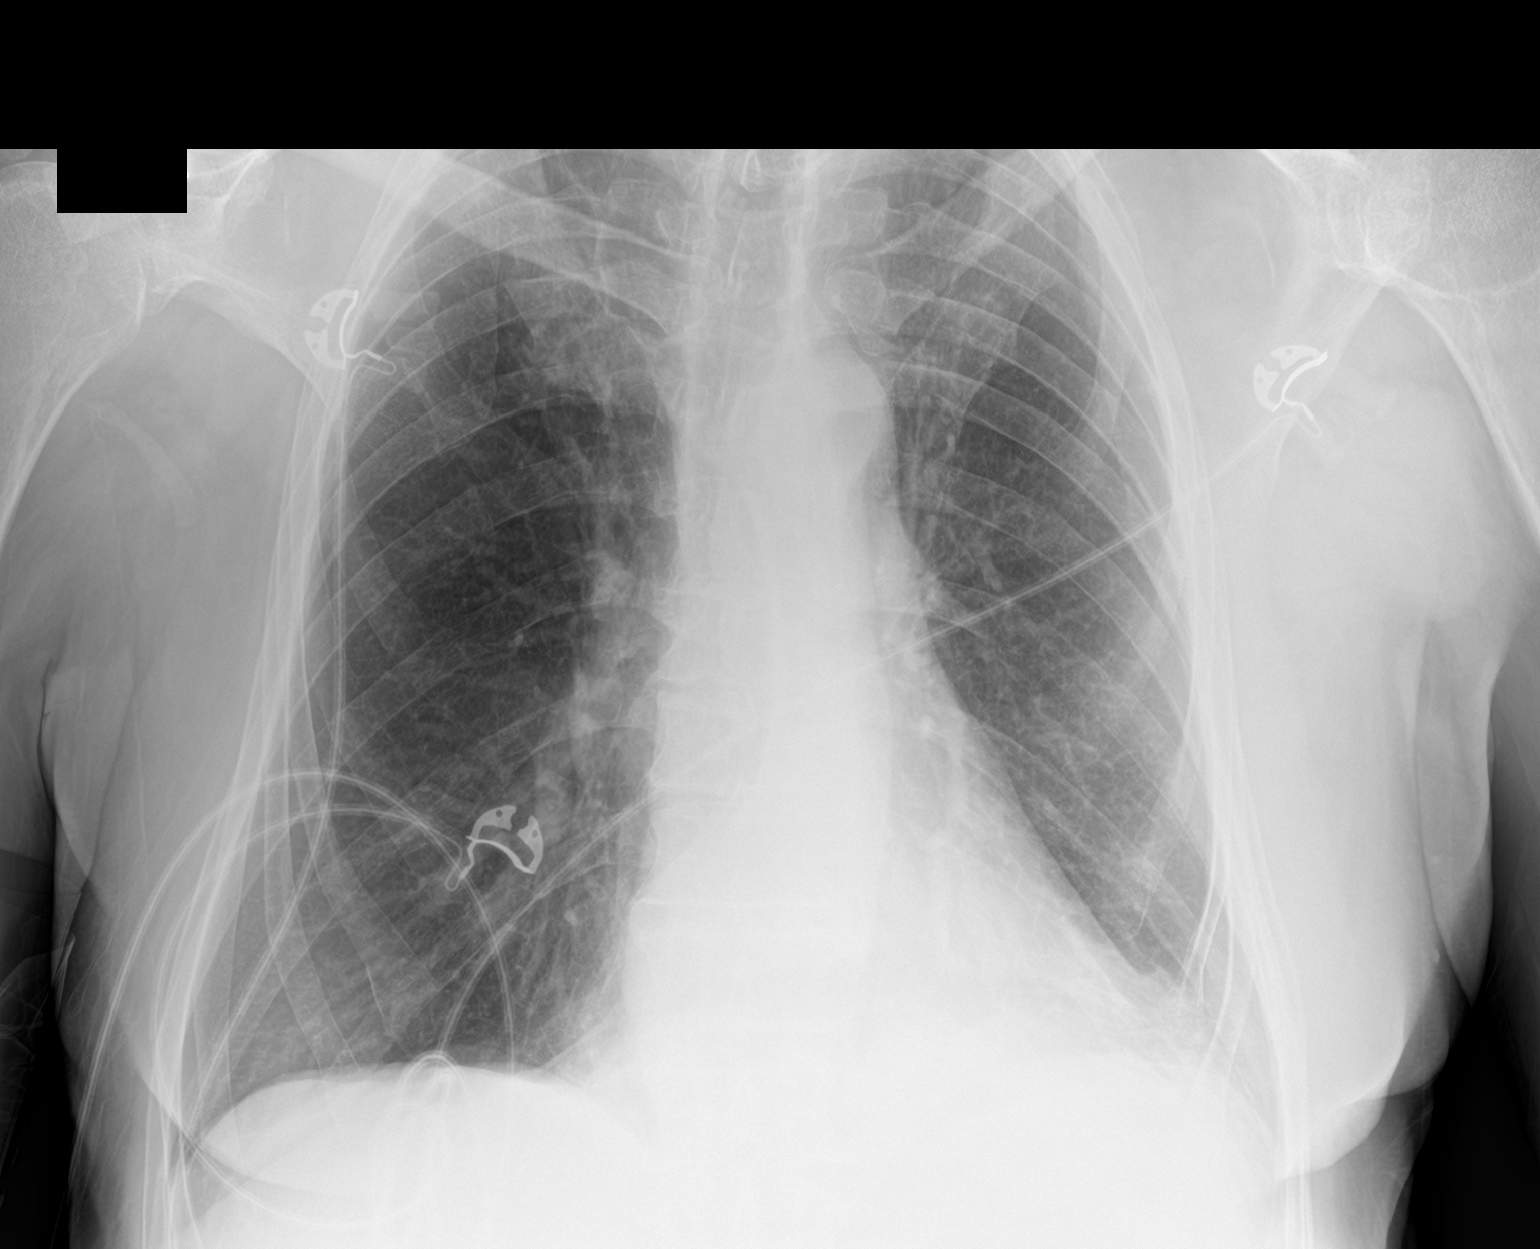

[2 of 2 positions shown; findings below may reference images not displayed]

FINDINGS: The heart size and mediastinal contours are within normal limits.
Pulmonary hyperinflation is consistent with COPD. Both lungs are
clear. The visualized skeletal structures are unremarkable.
IMPRESSION: COPD.  No active cardiopulmonary disease.

## 2019-09-02 ENCOUNTER — Other Ambulatory Visit: Payer: Self-pay | Admitting: Family Medicine

## 2019-09-05 ENCOUNTER — Encounter: Payer: Self-pay | Admitting: Family Medicine

## 2019-09-06 ENCOUNTER — Other Ambulatory Visit: Payer: Self-pay

## 2019-09-06 MED ORDER — SIMVASTATIN 40 MG PO TABS: ORAL_TABLET | ORAL | 1 refills | Status: DC

## 2019-09-19 DIAGNOSIS — Z87440 Personal history of urinary (tract) infections: Secondary | ICD-10-CM | POA: Diagnosis not present

## 2019-09-19 DIAGNOSIS — G9511 Acute infarction of spinal cord (embolic) (nonembolic): Secondary | ICD-10-CM | POA: Diagnosis not present

## 2019-09-19 DIAGNOSIS — R339 Retention of urine, unspecified: Secondary | ICD-10-CM | POA: Diagnosis not present

## 2019-09-26 ENCOUNTER — Ambulatory Visit: Payer: Medicare Other | Attending: Family Medicine | Admitting: Physical Therapy

## 2019-09-26 ENCOUNTER — Other Ambulatory Visit: Payer: Self-pay

## 2019-09-26 DIAGNOSIS — M6281 Muscle weakness (generalized): Secondary | ICD-10-CM | POA: Diagnosis not present

## 2019-09-26 NOTE — Therapy (Addendum)
[place] [facility] [address] [place], [place], [address] Phone: [phone]   Fax:  [phone]  Physical Therapy Evaluation  Patient Details  Name: Joseph Hayes MRN: [id number] Date of Birth: 14-Jun-1940 No data recorded  Encounter Date: 09/26/2019    Past Medical History:  Diagnosis Date  . BPH (benign prostatic hypertrophy)   . Coronary artery disease   . ERECTILE DYSFUNCTION, ORGANIC 05/16/2007  . H/O: CVA (cardiovascular accident)   . Hiatal hernia    wheelchair bound stroke residual  . History of shingles 04/07/2009   Treated shingles 2010 s/p immunization      . Hyperlipidemia   . Hypertension   . MI, old   . SHINGLES 04/07/2009  . Stroke [facility]) 2006    Past Surgical History:  Procedure Laterality Date  . CARDIAC CATHETERIZATION    . CORONARY ANGIOPLASTY      There were no vitals filed for this visit.   Subjective Assessment - 09/26/19 1102    Subjective  Patient is here to get a replacement power wc.    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  Also present was Delton See  to discuss recommendations and wheelchair options.  Further paperwork was completed and sent to vendor.  Patient appears to qualify for power mobility device at this time per objective findings.   MEDICAL HISTORY:hypertension, hyperlipidemia, shingels,Stroke, BPH, CAD Diagnosis:spinal infarct cva  Primary Diagnosis Onset: 6/23/ 2006  '[]' Progressive Disease Relevant Past and Future Surgeries: Height:5 feet 9 inches Weight:180 Explain  and recent changes or trends in weight: gained 5 lbs  Relevant History including falls: no       HOME ENVIRONMENT: '[x]' House  '[]' Condo/town home  '[]' Apartment  '[]' Assisted Living    '[]' Lives Alone '[x]'  Lives with Others                                                    Hours with caregiver: no caregiver  '[x]' Home is accessible to patient            Stairs  '[]' Yes '[x]'  No     Ramp '[x]' Yes '[]' No Comments:     COMMUNITY ADL: TRANSPORTATION: '[]' Car    '[x]' Van    '[]' Public Transportation    '[]' Adapted w/c Lift   '[]' Ambulance   '[]' Other:       '[]' Sits in wheelchair during transport  Employment/School:     Specific requirements pertaining to mobility        NA                                             Other:  FUNCTIONAL/SENSORY PROCESSING SKILLS:  Handedness:   '[]' Right     '[x]' Left    '[]' NA  Comments:                                 Functional Processing Skills for Wheeled Mobility '[x]' Processing Skills are adequate for safe wheelchair operation  Areas of concern than may interfere with safe operation of wheelchair Description of problem   '[]'  Attention to environment     '[]' Judgment     '[]'  Hearing  '[]'  Vision or visual processing    '[]' Motor Planning  '[]'  Fluctuations in Behavior                                                   VERBAL COMMUNICATION: '[x]' WFL receptive '[x]'  WFL expressive '[x]' Understandable  '[]' Difficult to understand  '[]' non-communicative '[]'  Uses an augmented communication device    CURRENT SEATING / MOBILITY: Current Mobility Base:  '[x]' invacare ranger rearwheel drive power chair None  '[]' Dependent  '[]' Manual  '[]' Scooter  '[x]' Power   Type of Control:                       Manufacturer:       invacare                  Size:       32 x18                  Age:  79 years old, 2012                       Current Condition of Mobility Base:   Non functional                                                                                                                    Current Wheelchair components:    J2 cushion, J back                                                                                                                                Describe posture in present seating system:    C spine, posterior pelvic tilt  SENSATION and SKIN ISSUES: Sensation '[]' Intact '[x]' Impaired '[]' Absent   Level of sensation:                           Pressure Relief: Able to perform effective pressure relief :   '[]' Yes  '[x]'  No Method: Patient partially performs push up in wc but for under 2 seconds and not all the way                                                                             If not, Why?:                                                                          Skin Issues/Skin Integrity Current Skin Issues   '[]' Yes '[x]' No  '[]' Intact '[]'  Red area '[]'  Open Area  '[]' Scar Tissue '[]' At risk from prolonged sitting  Where                              History of Skin Issues   '[]' Yes '[x]' No  Where                                         When                                               Hx of skin flap surgeries '[]' Yes '[x]' No  Where                  When                                                  Limited sitting tolerance '[]' Yes '[x]' No Hours spent sitting in wheelchair daily:   15                                                      Complaint of Pain:  Please describe: feet and hands hurt 8/10  Swelling/Edema:    Feet bilaterally                                                                                                                                               ADL STATUS (in reference to wheelchair use):  Indep Assist Unable Indep with Equip Not assessed Comments  Dressing                           x                                               Eating                         x                                                                                                     Toileting                             x                                                                                                  Bathing                           xx  Grooming/ Hygiene                             x                                                                                                 Meal Prep                             x                                                                                             IADLS                               x                                                                                   Bowel Management: '[]' Continent  '[x]' Incontinent  '[]' Accidents Comments:                                                  Bladder Management: '[]' Continent  '[x]' Incontinent  '[]' Accidents Comments:                                              WHEELCHAIR SKILLS: Manual w/c Propulsion: '[]' UE or LE strength and endurance sufficient to participate in ADLs using manual wheelchair Arm :  '[]' left '[]' right  '[]' Both                                   Foot:   '[]' left '[]' right  '[]' Both  Distance:   Operate Scooter: '[]'  Strength, hand grip, balance and transfer appropriate for use '[]' Living environment is accessible for use of scooter  Operate Power w/c:  '[x]'  Std. Joystick   '[]'  Alternative Controls Indep '[x]'  Assist '[]'  Dependent/ Unable '[]'  N/A '[]'  '[]' Safe          '[]'  Functional  Distance:                Bed confined without wheelchair '[x]'  Yes '[]'  No   STRENGTH/RANGE OF MOTION:  Range of Motion Strength  Shoulder                                                                                                           Left shoulder -3/5, R shoulder flex/abd 0/5  Elbow           Left elbow flex contracture 30 deg                                            Left flex 3/5, ext 3/5, right flex 3/5, ext -3/5                                                           Wrist/Hand                                                                                                                            Left wrist ext 3/5,flex 3/5: right wrist ext -3/5, flex 0/5             Hip                                                                                                                           0/5 BLE  Knee  0/5 BLE  Ankle                                                                  0/5 BLE      MOBILITY/BALANCE:  '[x]'  Patient is totally dependent for mobility                                                                                               Balance Transfers Ambulation  Sitting Balance: Standing Balance: '[]'  Independent '[]'  Independent/Modified Independent  '[]'  WFL     '[]'  WFL '[]'  Supervision '[]'  Supervision  '[x]'  Uses UE for balance  '[]'  Supervision '[x]'  Min Assist '[]'  Ambulates with Assist                           '[]'  Min Assist '[]'  Min assist '[]'  Mod Assist '[]'  Ambulates with Device:  '[]'  RW   '[]'  StW   '[]'  Cane   '[]'                 '[]'  Mod Assist '[]'  Mod assist '[]'  Max assist   '[]'  Max Assist '[]'  Max assist '[]'  Dependent '[]'  Indep. Short Distance Only  '[]'  Unable '[x]'  Unable '[]'  Lift / Sling Required Distance (in feet)                             '[x]'  Sliding board '[x]'  Unable to Ambulate: (Explain:  Cardio Status:  '[x]' Intact  '[]'  Impaired   '[]'  NA                              Respiratory Status:  '[x]' Intact   '[]' Impaired   '[]' NA                                     Orthotics/Prosthetics:                                                                         Comments (Address manual vs power w/c vs scooter):      Patient has 0/5 strength in BUE hands and  Deficits in BUE strength and is unable to propel  manual wc, Patient does not have trunk strength to sit in scooter.  Anterior / Posterior Obliquity Rotation-Pelvis  PELVIS    '[]' Neutral  '[x]'  Posterior  '[]'  Anterior     '[x]' WFL  '[]' Right Elevated  '[]' Left Elevated   '[x]' WFL  '[]' Right Anterior '[]'   Left Anterior    '[]'  Fixed '[]'  Partly Flexible '[x]'  Flexible  '[]'  Other  '[]'  Fixed  '[]'  Partly Flexible  '[x]'  Flexible '[]'  Other  '[]'  Fixed  '[]'  Partly Flexible  '[x]'  Flexible '[]'  Other  TRUNK '[]' WFL '[x]' Thoracic Kyphosis '[]' Lumbar Lordosis   '[x]'  WFL '[]' Convex Right '[]' Convex Left   '[]' c-curve '[]' s-curve '[]' multiple  '[x]'  Neutral '[]'  Left-anterior '[]'  Right-anterior    '[]'  Fixed '[x]'  Flexible '[]'  Partly Flexible       Other  '[]'  Fixed '[x]'  Flexible '[]'  Partly Flexible '[]'  Other  '[]'  Fixed           '[x]'  Flexible '[]'  Partly Flexible '[]'  Other   Position Windswept   HIPS  '[x]'  Neutral '[]'  Abduct '[]'  ADduct '[x]'  Neutral '[]'  Right '[]'  Left       '[]'  Fixed  '[]'  Partly Flexible             '[]'  Dislocated '[x]'  Flexible '[]'  Subluxed    '[]'  Fixed '[]'  Partly Flexible  '[x]'  Flexible '[]'  Other              Foot Positioning Knee Positioning   Knees and  Feet  '[x]'  WFL '[]' Left '[]' Right '[x]'  WFL '[]' Left '[]' Right   KNEES ROM concerns: ROM concerns:   & Dorsi-Flexed                    '[]' Lt '[]' Rt                                  FEET Plantar Flexed                  '[]' Lt '[]' Rt     Inversion                    '[]' Lt '[]' Rt     Eversion                    '[]' Lt '[]' Rt    HEAD '[x]'  Functional '[x]'  Good Head Control   & '[]'  Flexed         '[]'  Extended '[]'  Adequate Head Control   NECK '[]'  Rotated  Lt  '[]'  Lat Flexed Lt '[]'  Rotated  Rt '[]'  Lat Flexed Rt '[]'  Limited Head Control    '[]'  Cervical Hyperextension '[]'  Absent  Head Control    SHOULDERS ELBOWS WRIST& HAND         Left     Right    Left     Right  U/E '[x]' Functional  Left            '[x]' Functional  Right                                 '[]' Fisting              '[]' Fisting     '[]' elevated Left '[]' depressed  Left '[]' elevated Right '[]' depressed  Right      '[]' protracted Left '[]' retracted Left '[]' protracted Right '[]' retracted Right '[]' subluxed  Left              '[]' subluxed  Right         Goals for Wheelchair  Mobility  '[x]'  Independence with mobility in the home with motor related ADLs (MRADLs)  '[x]'  Independence with MRADLs in the community '[x]'  Provide dependent mobility  '[]'  Provide recline     '[]' Provide tilt   Goals for Seating system '[x]'  Optimize pressure distribution '[x]'  Provide support needed to facilitate function or safety '[x]'  Provide corrective forces to assist with maintaining or improving posture '[x]'  Accommodate client's posture: current seated postures and positions are not flexible or will not tolerate corrective forces '[]'  Client to be independent with relieving pressure in the wheelchair '[x]' Enhance physiological function such as breathing, swallowing, digestion  Simulation ideas/Equipment trials:                                                                                                State why other equipment was unsuccessful:                                                                                 MOBILITY BASE RECOMMENDATIONS and JUSTIFICATION: MOBILITY COMPONENT JUSTIFICATION  Manufacturer:           Model:   invacare           Size: Width   18        Seat Depth   18          '[x]' provide transport from point A to B '[x]' promote Indep mobility  '[x]' is not a safe, functional ambulator '[x]' walker or cane inadequate '[]' non-standard width/depth necessary to accommodate anatomical measurement '[]'                             '[]' Manual Mobility Base '[]' non-functional ambulator    '[]' Scooter/POV  '[]' can safely operate  '[]' can safely transfer   '[]' has adequate trunk stability  '[]' cannot functionally propel manual w/c  '[x]' Power Mobility Base  '[x]' non-ambulatory  '[x]' cannot functionally propel manual wheelchair  '[x]'  cannot functionally and  safely operate scooter/POV '[x]' can safely operate and willing to  '[]' Stroller Base '[]' infant/child  '[]' unable to propel manual wheelchair '[]' allows for growth '[]' non-functional ambulator '[]' non-functional UE '[]' Indep mobility is not a goal at this time  '[]' Tilt  '[]' Forward                   '[]' Backward                  '[]' Powered tilt              '[]' Manual tilt  '[]' change position against gravitational force on head and shoulders  '[]' change position for pressure relief/cannot weight shift '[]' transfers  '[]' management of tone '[]' rest periods '[]' control edema '[]' facilitate postural control  '[]'                                       '[]'   Recline  '[]' Power recline on power base '[]' Manual recline on manual base  '[]' accommodate femur to back angle  '[]' bring to full recline for ADL care  '[]' change position for pressure relief/cannot weight shift '[]' rest periods '[]' repositioning for transfers or clothing/diaper /catheter changes '[]' head positioning  '[]' Lighter weight required '[]' self- propulsion  '[]' lifting '[]'                                                 '[]' Heavy Duty required '[]' user weight greater than 250# '[]' extreme tone/ over active movement '[]' broken frame on previous chair '[]'                                     '[x]'  Back  '[]'  Angle Adjustable '[]'  Custom molded     J3 back rest                      '[x]' postural control '[]' control of tone/spasticity '[]' accommodation of range of motion '[]' UE functional control '[]' accommodation for seating system '[]'                                          '[x]' provide lateral trunk support '[]' accommodate deformity '[x]' provide posterior trunk support '[x]' provide lumbar/sacral support '[x]' support trunk in midline '[]' Pressure relief over spinal processes  '[x]'  Seat Cushion                       '[x]' impaired sensation  '[]' decubitus ulcers present '[]' history of pressure ulceration '[x]' prevent pelvic extension '[x]' low maintenance  '[x]' stabilize pelvis  '[]' accommodate obliquity '[]' accommodate multiple  deformity '[x]' neutralize lower extremity position '[x]' increase pressure distribution '[]'                                           '[]'  Pelvic/thigh support  '[]'  Lateral thigh guide '[]'  Distal medial pad  '[]'  Distal lateral pad '[]'  pelvis in neutral '[]' accommodate pelvis '[]'  position upper legs '[]'  alignment '[]'  accommodate ROM '[]'  decrease adduction '[]' accommodate tone '[]' removable for transfers '[]' decrease abduction  '[]'  Lateral trunk Supports '[]'  Lt     '[]'  Rt '[]' decrease lateral trunk leaning '[]' control tone '[]' contour for increased contact '[]' safety  '[]' accommodate asymmetry '[]'                                                 '[]'  Mounting hardware  '[]' lateral trunk supports  '[]' back   '[]' seat '[]' headrest      '[]'  thigh support '[]' fixed   '[]' swing away '[]' attach seat platform/cushion to w/c frame '[]' attach back cushion to w/c frame '[]' mount postural supports '[]' mount headrest  '[]' swing medial thigh support away '[]' swing lateral supports away for transfers  '[]'                                                     Armrests  '[]' fixed [  x]adjustable height '[x]' removable   '[]' swing away  '[x]' flip back   '[]' reclining '[]' full length pads '[]' desk    '[]' pads tubular  '[x]' provide support with elbow at 90   '[]' provide support for w/c tray '[x]' change of height/angles for variable activities '[x]' remove for transfers '[x]' allow to come closer to table top '[x]' remove for access to tables '[]'                                               Hangers/ Leg rests  '[]' 60 '[x]' 70 '[]' 90 '[]' elevating '[]' heavy duty  '[]' articulating '[]' fixed '[]' lift off '[x]' swing away     '[]' power '[x]' provide LE support  '[]' accommodate to hamstring tightness '[x]' elevate legs during recline   '[]' provide change in position for Legs '[]' Maintain placement of feet on footplate '[]' durability '[]' enable transfers '[]' decrease edema '[]' Accommodate lower leg length '[]'                                         Foot support Footplate    '[x]' Lt  '[x]'  Rt  '[]'  Center mount '[]' flip up                             '[x]' depth/angle adjustable '[]' Amputee adapter    '[]'  Lt     '[]'  Rt '[x]' provide foot support '[x]' accommodate to ankle ROM '[]' transfers '[]' Provide support for residual extremity '[]'  allow foot to go under wheelchair base '[]'  decrease tone  '[]'                                                 '[x]'  Ankle strap/heel loops '[x]' support foot on foot support '[]' decrease extraneous movement '[x]' provide input to heel  '[x]' protect foot  Tires: '[]' pneumatic  '[]' flat free inserts  '[]' solid  '[]' decrease maintenance  '[]' prevent frequent flats '[]' increase shock absorbency '[]' decrease pain from road shock '[]' decrease spasms from road shock '[]'                                              '[]'  Headrest  '[]' provide posterior head support '[]' provide posterior neck support '[]' provide lateral head support '[]' provide anterior head support '[]' support during tilt and recline '[]' improve feeding   '[]' improve respiration '[]' placement of switches '[]' safety  '[]' accommodate ROM  '[]' accommodate tone '[]' improve visual orientation  '[]'  Anterior chest strap '[]'  Vest '[]'  Shoulder retractors  '[]' decrease forward movement of shoulder '[]' accommodation of TLSO '[]' decrease forward movement of trunk '[]' decrease shoulder elevation '[]' added abdominal support '[]' alignment '[]' assistance with shoulder control  '[]'                                               Pelvic Positioner '[x]' Belt '[]' SubASIS bar '[]' Dual Pull '[]' stabilize tone '[x]' decrease falling out of chair/ **will not Decrease potential for sliding due to pelvic tilting '[]' prevent excessive rotation '[]' pad for protection over boney prominence '[]' prominence comfort '[]' special pull angle to control rotation '[]'   Upper ExtremitySupport  '[]' L   '[]'  R '[]' Arm trough   '[]' hand support '[]'  tray       '[]' full tray '[]' swivel mount '[]' decrease edema      '[]' decrease subluxation   '[]' control tone   '[]' placement for AAC/Computer/EADL '[]' decrease gravitational pull on shoulders  '[]' provide midline positioning '[]' provide support to increase UE function '[]' provide hand support in natural position '[]' provide work surface   POWER WHEELCHAIR CONTROLS  '[x]' Proportional  '[]' Non-Proportional Type                                      '[]' Left  '[]' Right '[x]' provides access for controlling wheelchair   '[]' lacks motor control to operate proportional drive control '[]' unable to understand proportional controls  Actuator Control Module  '[]' Single  '[]' Multiple   '[]' Allow the client to operate the power seat function(s) through the joystick control   '[]' Safety Reset Switches '[]' Used to change modes and stop the wheelchair when driving in latch mode    '[]' Upgraded Electronics   '[]' programming for accurate control '[]' progressive Disease/changing condition '[]' non-proportional drive control needed '[]' Needed in order to operate power seat functions through joystick control   '[]' Display box '[]' Allows user to see in which mode and drive the wheelchair is set  '[]' necessary for alternate controls    '[]' Digital interface electronics '[]' Allows w/c to operate when using alternative drive controls  '[]' ASL Head Array '[]' Allows client to operate wheelchair  through switches placed in tri-panel headrest  '[]' Sip and puff with tubing kit '[]' needed to operate sip and puff drive controls  '[]' Upgraded tracking electronics '[]' increase safety when driving '[]' correct tracking when on uneven surfaces  '[x]' Mount for switches or joystick '[]' Attaches switches to w/c  '[x]' Swing away for access or transfers '[]' midline for optimal placement '[]' provides for consistent access  '[]' Attendant controlled joystick plus mount '[]' safety '[]' long distance driving '[]' operation of seat functions '[]' compliance with transportation regulations '[]'                                             Rear wheel placement/Axle adjustability '[]' None '[]' semi adjustable '[]' fully adjustable  '[]' improved UE access to wheels '[]' improved stability '[]' changing angle in space for  improvement of postural stability '[]' 1-arm drive access '[]' amputee pad placement '[]'                                Wheel rims/ hand rims  '[]' metal   '[]' plastic coated '[]' oblique projections           '[]' vertical projections '[]' Provide ability to propel manual wheelchair  '[]'  Increase self-propulsion with hand weakness/decreased grasp  Push handles '[]' extended   '[]' angle adjustable              '[]' standard '[]' caregiver access '[]' caregiver assist '[]' allows "hooking" to enable increased ability to perform ADLs or maintain balance  One armed device   '[]' Lt   '[]' Rt '[]' enable propulsion of manual wheelchair with one arm   '[]'                                            Brake/wheel lock extension '[]'  Lt   '[]'  Rt '[]' increase indep in applying wheel locks   '[]' Side guards '[]' prevent  clothing getting caught in wheel or becoming soiled '[]'  prevent skin tears/abrasions  Battery:     2                                       '[x]' to power wheelchair                                                         Other:                                                                                                                        The above equipment has a life- long use expectancy. Growth and changes in medical and/or functional conditions would be the exceptions. This is to certify that the therapist has no financial relationship with durable medical provider or manufacturer. The therapist will not receive remuneration of any kind for the equipment recommended in this evaluation.   Patient has mobility limitation that significantly impairs safe, timely participation in one or more mobility related ADL's. (bathing, toileting, feeding, dressing, grooming, moving from room to room)  '[x]'  Yes '[]'  No  Will mobility device sufficiently improve ability to participate and/or be aided in participation of MRADL's?      '[x]'  Yes '[]'  No  Can limitation be compensated for with use of a cane or walker?                                    '[]'  Yes '[x]'   No  Does patient or caregiver demonstrate ability/potential ability & willingness to safely use the mobility device?    '[x]'  Yes '[]'  No  Does patient's home environment support use of recommended mobility device?            '[x]'  Yes '[]'  No  Does patient have sufficient upper extremity function necessary to functionally propel a manual wheelchair?     '[]'  Yes '[x]'  No  Does patient have sufficient strength and trunk stability to safely operate a POV (scooter)?                                  '[]'  Yes '[x]'  No  Does patient need additional features/benefits provided by a power wheelchair for MRADL's in the home?        '[x]'  Yes '[]'  No  Does the patient demonstrate the ability to safely use a power wheelchair?                   '[x]'  Yes '[]'  No     Physician's Name Printed:    Dr. Annie Main  Hunter                                                    Physician's Signature:  Date:     This is to certify that I, the above signed therapist have the following affiliations: '[]'  This DME provider '[]'  Manufacturer of recommended equipment '[]'  Patient's long term care facility '[x]'  None of the above  Therapist Name/Signature:    Arelia Sneddon PT DPT                                        Date:09/25/20                Objective measurements completed on examination: See above findings.                             Patient will benefit from skilled therapeutic intervention in order to improve the following deficits and impairments:     Visit Diagnosis: Muscle weakness (generalized)     Problem List Patient Active Problem List   Diagnosis Date Noted  . Recurrent UTI 05/17/2017  . Low vitamin D level 03/01/2017  . Eczema 07/10/2014  . Psoriasis and similar disorders 07/23/2013  . Vitamin B12 deficiency 10/17/2008  . CAD (coronary artery disease) 05/23/2008  . GERD 08/24/2007  . UTI (urinary tract infection) 01/09/2007  . Hyperlipidemia 01/05/2007  . Essential  hypertension 01/05/2007  . Spinal cord infarction [facility]) 01/05/2007    Alanson Puls, PT DPT 09/26/2019, 11:03 AM  [place] [facility] [address] [place], [place], [address] Phone: [phone]   Fax:  [phone]  Name: Joseph Hayes MRN: [id number] Date of Birth: 07-28-1940

## 2019-10-30 IMAGING — DX DG CHEST 2V
2 series · 2 of 2 positions shown · non-contrast
Comparison: Two-view chest x-ray 02/26/2018

CLINICAL DATA: Fever and weakness for several days.

EXAM:
CHEST - 2 VIEW

[chest pa]
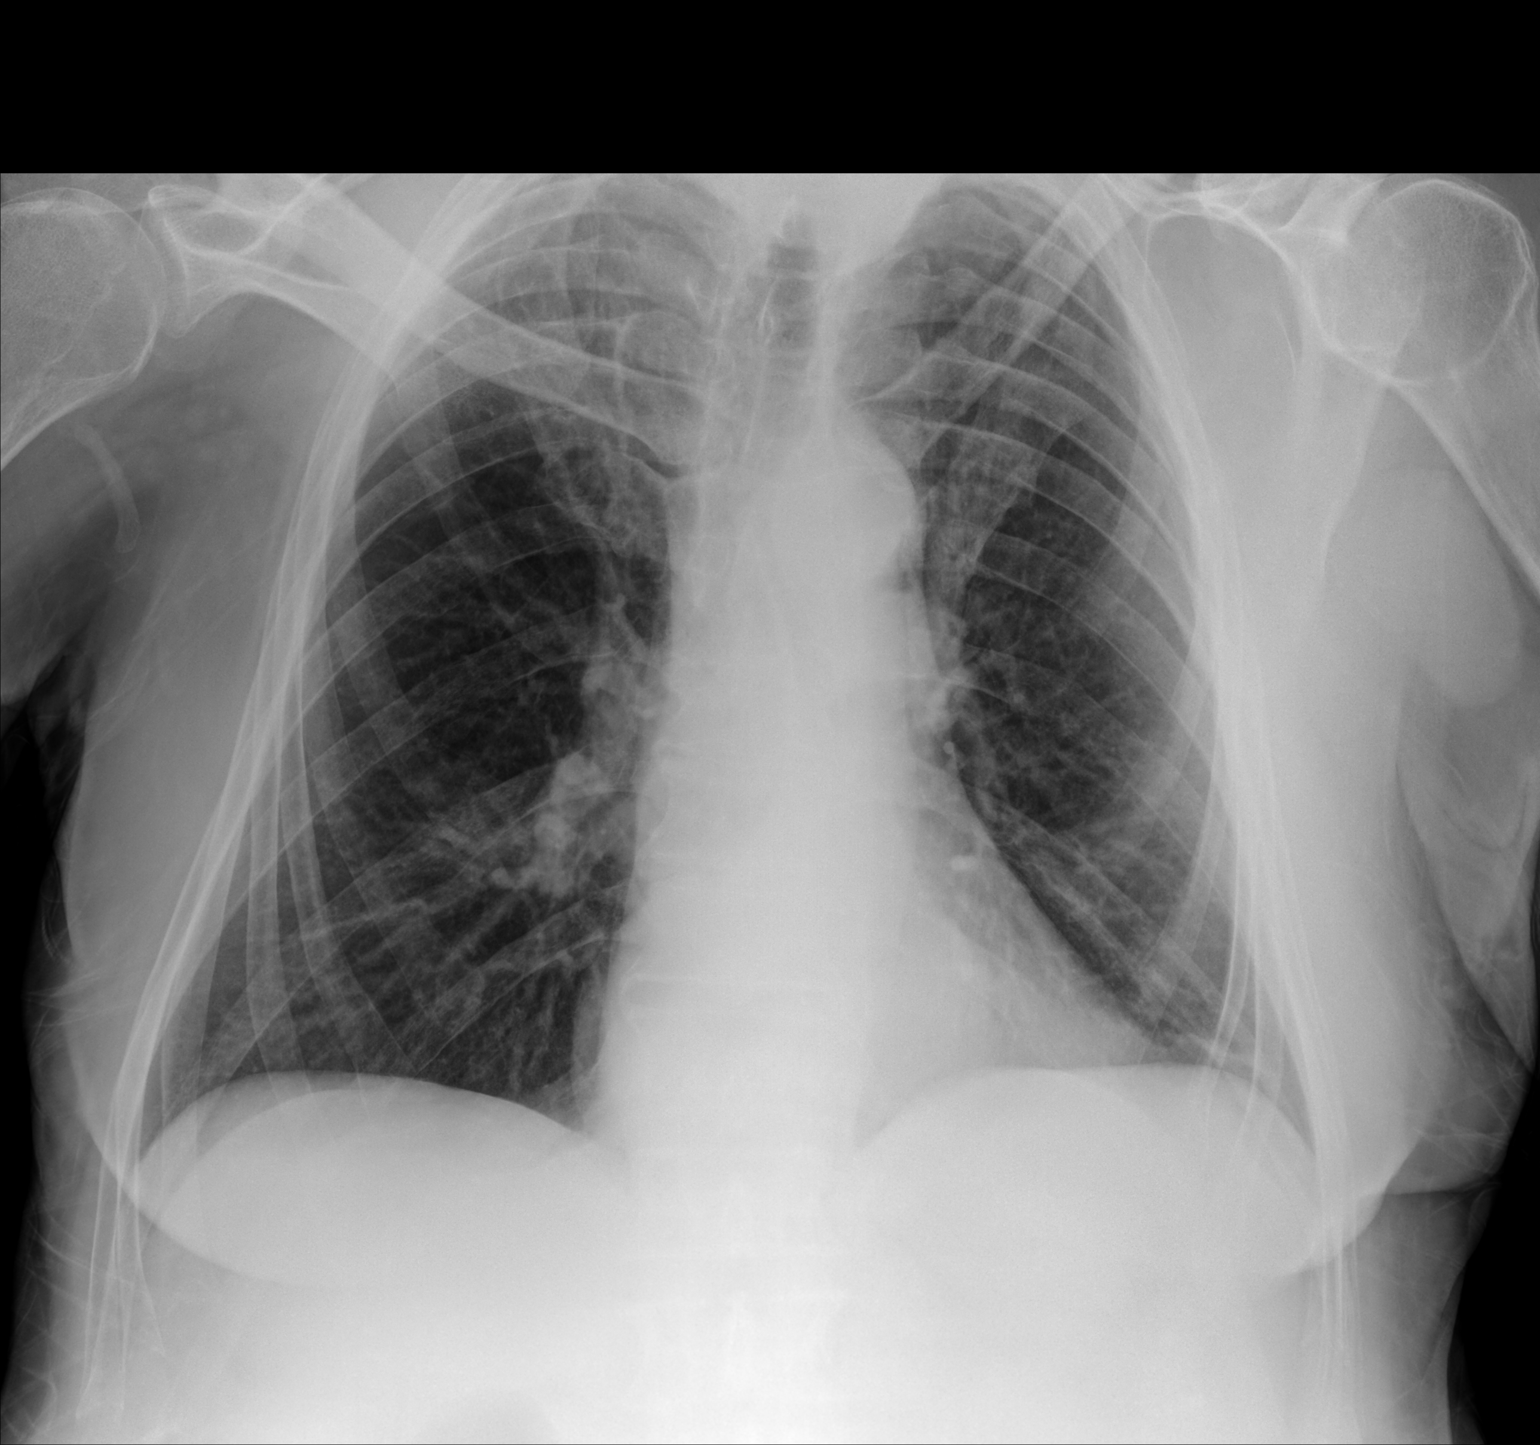

[chest lat]
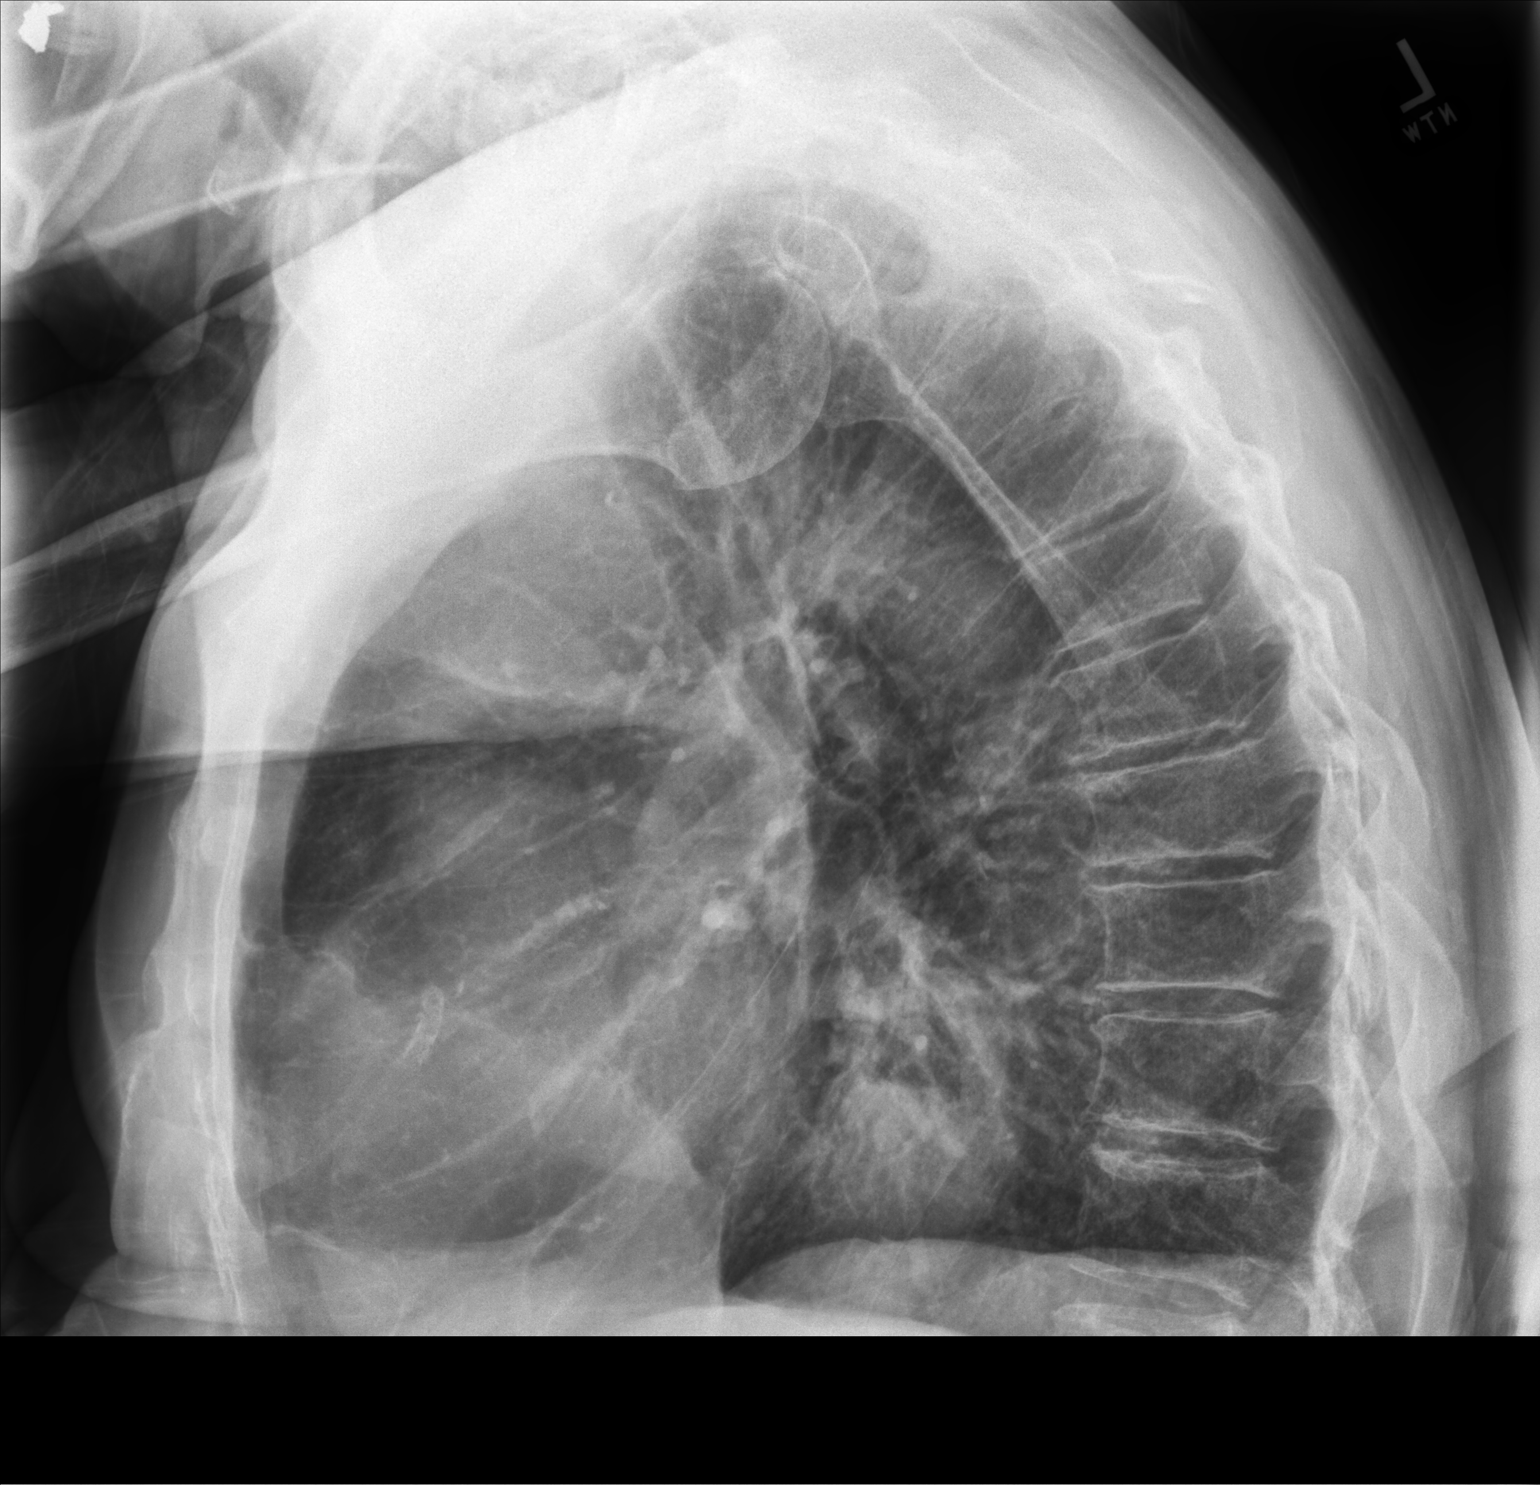

[2 of 2 positions shown; findings below may reference images not displayed]

FINDINGS: Heart size is normal. Lungs are hyperinflated. There is no edema or
effusion. No focal airspace disease is present. Exaggerated thoracic
kyphosis is stable. Coronary artery stents are again seen. A
moderate-sized hiatal hernia is present.
IMPRESSION: 1. No acute cardiopulmonary disease.
2. Stable chronic hyperinflation of both lungs.
3. Coronary artery disease.
4. Moderate-sized hiatal hernia.

## 2019-10-31 DIAGNOSIS — H2513 Age-related nuclear cataract, bilateral: Secondary | ICD-10-CM | POA: Diagnosis not present

## 2019-10-31 DIAGNOSIS — H43392 Other vitreous opacities, left eye: Secondary | ICD-10-CM | POA: Diagnosis not present

## 2019-11-07 ENCOUNTER — Encounter: Payer: Self-pay | Admitting: Dermatology

## 2019-11-07 ENCOUNTER — Other Ambulatory Visit: Payer: Self-pay

## 2019-11-07 ENCOUNTER — Ambulatory Visit: Payer: Medicare Other | Admitting: Dermatology

## 2019-11-07 DIAGNOSIS — L821 Other seborrheic keratosis: Secondary | ICD-10-CM | POA: Diagnosis not present

## 2019-11-07 DIAGNOSIS — D229 Melanocytic nevi, unspecified: Secondary | ICD-10-CM

## 2019-11-07 DIAGNOSIS — D485 Neoplasm of uncertain behavior of skin: Secondary | ICD-10-CM

## 2019-11-07 DIAGNOSIS — L918 Other hypertrophic disorders of the skin: Secondary | ICD-10-CM | POA: Diagnosis not present

## 2019-11-07 DIAGNOSIS — D235 Other benign neoplasm of skin of trunk: Secondary | ICD-10-CM

## 2019-11-07 DIAGNOSIS — L570 Actinic keratosis: Secondary | ICD-10-CM | POA: Diagnosis not present

## 2019-11-07 NOTE — Patient Instructions (Addendum)
Biopsy, Surgery (Curettage) & Surgery (Excision) Aftercare Instructions  1. Okay to remove bandage in 24 hours  2. Wash area with soap and water  3. Apply Vaseline to area twice daily until healed (Not Neosporin)  4. Okay to cover with a Band-Aid to decrease the chance of infection or prevent irritation from clothing; also it's okay to uncover lesion at home.  5. Suture instructions: return to our office in 7-10 or 10-14 days for a nurse visit for suture removal. Variable healing with sutures, if pain or itching occurs call our office. It's okay to shower or bathe 24 hours after sutures are given.  6. The following risks may occur after a biopsy, curettage or excision: bleeding, scarring, discoloration, recurrence, infection (redness, yellow drainage, pain or swelling).  7. For questions, concerns and results call our office at [place] before 4pm & Friday before 3pm. Biopsy results will be available in 1 week.  Biopsy, Surgery (Curettage) & Surgery (Excision) Aftercare Instructions  1. Okay to remove bandage in 24 hours  2. Wash area with soap and water  3. Apply Vaseline to area twice daily until healed (Not Neosporin)  4. Okay to cover with a Band-Aid to decrease the chance of infection or prevent irritation from clothing; also it's okay to uncover lesion at home.  5. Suture instructions: return to our office in 7-10 or 10-14 days for a nurse visit for suture removal. Variable healing with sutures, if pain or itching occurs call our office. It's okay to shower or bathe 24 hours after sutures are given.  6. The following risks may occur after a biopsy, curettage or excision: bleeding, scarring, discoloration, recurrence, infection (redness, yellow drainage, pain or swelling).  7. For questions, concerns and results call our office at [place] before 4pm & Friday before 3pm. Biopsy results will be available in 1 week.  First visit for Joseph Hayes date of birth  [birth year]. Of some concern to the family were multiple new brown spots particularly on the left temple and cheek.  Joseph Hayes has multiple benign keratoses on the face and the back and the upper arms.  These do not require removal or watching.  A similar lesion on the right elbow crease could be a wart and if that were to grow or bleed or bother Joseph Hayes it can easily be removed.  1 hornlike spot on the top of the right ear was removed and lightly cauterized; they could check on their home MyChart or call my office in 2 days for the results, but I expect it will not show skin cancer.  To precancer crusts on the right ear were treated with liquid nitrogen freeze.  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  There are no atypical moles or other suspicious spots.  If all goes well Joseph Hayes can return either annually for a routine skin check or as needed if there is any changes.  We also discussed the easy bruising on his arms; if this gets worse they could look for a nonprescription product called Dermend and apply it daily for minimal of 1 month.

## 2019-11-09 NOTE — Progress Notes (Signed)
Addendum:  On the visit date of 07/18/2019, this patient was evaluated for power mobility needs.  Patient has mobility limitation due to spinal cord infarction that prevents accomplishing ADLs.  This cannot be resolved by use of a fitted cane or walker.  The patient does not have sufficient arm function to propel a wheelchair to perform mobility related ADLs.  This patient could benefit from use of especially configured power wheelchair and can safely transfer to and from the current wheelchair and operate the controls.  APO B has been ruled out.  Thanks, Garret Reddish, MD, primary care physician

## 2019-11-12 ENCOUNTER — Telehealth: Payer: Self-pay

## 2019-11-12 NOTE — Telephone Encounter (Signed)
A representative called about paperwork for a powered wheelchair for patient. She sent paperwork on June 30 and she hasn't received it back and signed.

## 2019-11-12 NOTE — Telephone Encounter (Signed)
Placed in to fax folder

## 2019-11-12 NOTE — Telephone Encounter (Signed)
PPW placed in your folder for review

## 2019-11-26 ENCOUNTER — Encounter: Payer: Self-pay | Admitting: Dermatology

## 2019-11-26 NOTE — Progress Notes (Signed)
   New Patient   Subjective  Chiron Campione Zambrana is a 79 y.o. male who presents for the following: Skin Problem (check spots on face and neck area. Possible SKs. Also check patients chest and back.  No history of skin cancer. ).  Growth Location: Right ear Duration: Months Quality: Sensitive Associated Signs/Symptoms: Modifying Factors:  Severity:  Timing: Context:    The following portions of the chart were reviewed this encounter and updated as appropriate: Tobacco  Allergies  Meds  Problems  Med Hx  Surg Hx  Fam Hx      Objective  Well appearing patient in no apparent distress; mood and affect are within normal limits.  A full examination was performed including scalp, head, eyes, ears, nose, lips, neck, chest, axillae, abdomen, back, buttocks, bilateral upper extremities, bilateral lower extremities, hands, feet, fingers, toes, fingernails, and toenails. All findings within normal limits unless otherwise noted below.   Assessment & Plan  Neoplasm of uncertain behavior of skin Right Upper Ear Rim  Skin / nail biopsy Type of biopsy: tangential   Informed consent: discussed and consent obtained   Timeout: patient name, date of birth, surgical site, and procedure verified   Procedure prep:  Patient was prepped and draped in usual sterile fashion Prep type:  Chlorhexidine Anesthesia: the lesion was anesthetized in a standard fashion   Anesthetic:  1% lidocaine w/ epinephrine 1-100,000 local infiltration Instrument used: flexible razor blade   Hemostasis achieved with: ferric subsulfate   Outcome: patient tolerated procedure well   Post-procedure details: wound care instructions given    Specimen 1 - Surgical pathology Differential Diagnosis: bcc vs scc Check Margins: No  First visit for Mr. Alvah Garcilazo date of birth 22-Mar-1941. Of some concern to the family were multiple new brown spots particularly on the left temple and cheek.  Mr. Furr has multiple benign  keratoses on the face and the back and the upper arms.  These do not require removal or watching.  A similar lesion on the right elbow crease could be a wart and if that were to grow or bleed or bother Mr. Rieth it can easily be removed.  1 hornlike spot on the top of the right ear was removed and lightly cauterized; they could check on their home MyChart or call my office in 2 days for the results, but I expect it will not show skin cancer.  To precancer crusts on the right ear were treated with liquid nitrogen freeze.  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  There are no atypical moles or other suspicious spots.  If all goes well Mr. Dillenbeck can return either annually for a routine skin check or as needed if there is any changes.  We also discussed the easy bruising on his arms; if this gets worse they could look for a nonprescription product called Dermend and apply it daily for minimal of 1 month.

## 2019-12-28 DIAGNOSIS — R339 Retention of urine, unspecified: Secondary | ICD-10-CM | POA: Diagnosis not present

## 2019-12-28 DIAGNOSIS — G9511 Acute infarction of spinal cord (embolic) (nonembolic): Secondary | ICD-10-CM | POA: Diagnosis not present

## 2019-12-28 DIAGNOSIS — Z87440 Personal history of urinary (tract) infections: Secondary | ICD-10-CM | POA: Diagnosis not present

## 2020-01-10 DIAGNOSIS — G9519 Other vascular myelopathies: Secondary | ICD-10-CM | POA: Diagnosis not present

## 2020-01-10 DIAGNOSIS — G8250 Quadriplegia, unspecified: Secondary | ICD-10-CM | POA: Diagnosis not present

## 2020-02-25 ENCOUNTER — Other Ambulatory Visit: Payer: Self-pay | Admitting: Family Medicine

## 2020-03-21 DIAGNOSIS — G9511 Acute infarction of spinal cord (embolic) (nonembolic): Secondary | ICD-10-CM | POA: Diagnosis not present

## 2020-03-21 DIAGNOSIS — R339 Retention of urine, unspecified: Secondary | ICD-10-CM | POA: Diagnosis not present

## 2020-03-21 DIAGNOSIS — Z87440 Personal history of urinary (tract) infections: Secondary | ICD-10-CM | POA: Diagnosis not present

## 2020-03-31 DIAGNOSIS — H02051 Trichiasis without entropian right upper eyelid: Secondary | ICD-10-CM | POA: Diagnosis not present

## 2020-04-10 DIAGNOSIS — G9519 Other vascular myelopathies: Secondary | ICD-10-CM | POA: Diagnosis not present

## 2020-04-10 DIAGNOSIS — G8250 Quadriplegia, unspecified: Secondary | ICD-10-CM | POA: Diagnosis not present

## 2020-04-15 ENCOUNTER — Telehealth: Payer: Self-pay | Admitting: Family Medicine

## 2020-04-15 NOTE — Telephone Encounter (Signed)
Left message for patient to call back and schedule Medicare Annual Wellness Visit (AWV) either virtually OR in office.   Last AWV 04/05/19; please schedule at anytime with [facility]Nurse Health Advisor at [facility].  This should be a 45 minute visit.

## 2020-04-22 ENCOUNTER — Other Ambulatory Visit: Payer: Self-pay | Admitting: Family Medicine

## 2020-05-23 ENCOUNTER — Ambulatory Visit: Payer: Medicare Other

## 2020-06-21 ENCOUNTER — Other Ambulatory Visit: Payer: Self-pay | Admitting: Family Medicine

## 2020-06-26 DIAGNOSIS — R339 Retention of urine, unspecified: Secondary | ICD-10-CM | POA: Diagnosis not present

## 2020-06-26 DIAGNOSIS — Z87440 Personal history of urinary (tract) infections: Secondary | ICD-10-CM | POA: Diagnosis not present

## 2020-06-26 DIAGNOSIS — G9511 Acute infarction of spinal cord (embolic) (nonembolic): Secondary | ICD-10-CM | POA: Diagnosis not present

## 2020-07-10 DIAGNOSIS — H02051 Trichiasis without entropian right upper eyelid: Secondary | ICD-10-CM | POA: Diagnosis not present

## 2020-07-11 ENCOUNTER — Telehealth: Payer: Self-pay

## 2020-07-11 NOTE — Telephone Encounter (Signed)
Patient called and states we called him regarding supplies that was ready I do not see any record or reason for the call.

## 2020-07-11 NOTE — Telephone Encounter (Signed)
Patient does need the supplies the RX has been faxed to the pharmacy

## 2020-08-13 DIAGNOSIS — N3020 Other chronic cystitis without hematuria: Secondary | ICD-10-CM | POA: Diagnosis not present

## 2020-08-13 DIAGNOSIS — N318 Other neuromuscular dysfunction of bladder: Secondary | ICD-10-CM | POA: Diagnosis not present

## 2020-09-23 ENCOUNTER — Other Ambulatory Visit: Payer: Self-pay | Admitting: Family Medicine

## 2020-10-07 DIAGNOSIS — G9511 Acute infarction of spinal cord (embolic) (nonembolic): Secondary | ICD-10-CM | POA: Diagnosis not present

## 2020-10-07 DIAGNOSIS — Z87440 Personal history of urinary (tract) infections: Secondary | ICD-10-CM | POA: Diagnosis not present

## 2020-10-07 DIAGNOSIS — R339 Retention of urine, unspecified: Secondary | ICD-10-CM | POA: Diagnosis not present

## 2020-11-25 ENCOUNTER — Other Ambulatory Visit: Payer: Self-pay | Admitting: Family Medicine

## 2020-12-19 DIAGNOSIS — N3000 Acute cystitis without hematuria: Secondary | ICD-10-CM | POA: Diagnosis not present

## 2020-12-19 DIAGNOSIS — N3020 Other chronic cystitis without hematuria: Secondary | ICD-10-CM | POA: Diagnosis not present

## 2020-12-19 DIAGNOSIS — N318 Other neuromuscular dysfunction of bladder: Secondary | ICD-10-CM | POA: Diagnosis not present

## 2020-12-19 DIAGNOSIS — R310 Gross hematuria: Secondary | ICD-10-CM | POA: Diagnosis not present

## 2020-12-30 DIAGNOSIS — G9511 Acute infarction of spinal cord (embolic) (nonembolic): Secondary | ICD-10-CM | POA: Diagnosis not present

## 2020-12-30 DIAGNOSIS — R339 Retention of urine, unspecified: Secondary | ICD-10-CM | POA: Diagnosis not present

## 2020-12-30 DIAGNOSIS — Z87440 Personal history of urinary (tract) infections: Secondary | ICD-10-CM | POA: Diagnosis not present

## 2021-01-15 DIAGNOSIS — H02051 Trichiasis without entropian right upper eyelid: Secondary | ICD-10-CM | POA: Diagnosis not present

## 2021-01-23 DIAGNOSIS — N3020 Other chronic cystitis without hematuria: Secondary | ICD-10-CM | POA: Diagnosis not present

## 2021-01-23 DIAGNOSIS — N318 Other neuromuscular dysfunction of bladder: Secondary | ICD-10-CM | POA: Diagnosis not present

## 2021-02-28 ENCOUNTER — Other Ambulatory Visit: Payer: Self-pay | Admitting: Family Medicine

## 2021-03-25 ENCOUNTER — Other Ambulatory Visit: Payer: Self-pay | Admitting: Family Medicine

## 2021-03-28 ENCOUNTER — Other Ambulatory Visit: Payer: Self-pay | Admitting: Family Medicine

## 2021-04-03 ENCOUNTER — Ambulatory Visit: Payer: Medicare Other | Admitting: Family

## 2021-04-08 ENCOUNTER — Ambulatory Visit (INDEPENDENT_AMBULATORY_CARE_PROVIDER_SITE_OTHER): Payer: Medicare Other | Admitting: Family Medicine

## 2021-04-08 ENCOUNTER — Other Ambulatory Visit: Payer: Self-pay

## 2021-04-08 ENCOUNTER — Encounter: Payer: Self-pay | Admitting: Family Medicine

## 2021-04-08 VITALS — BP 146/84 | HR 60 | Temp 98.3°F | Ht 69.0 in

## 2021-04-08 DIAGNOSIS — H9312 Tinnitus, left ear: Secondary | ICD-10-CM | POA: Diagnosis not present

## 2021-04-08 DIAGNOSIS — H6123 Impacted cerumen, bilateral: Secondary | ICD-10-CM | POA: Diagnosis not present

## 2021-04-08 DIAGNOSIS — Z23 Encounter for immunization: Secondary | ICD-10-CM

## 2021-04-08 NOTE — Patient Instructions (Signed)
Please follow up if symptoms do not improve or as needed.    You may use Debrox, an OTC ear wax softener on the right.   Return as needed for irrigation.   Earwax Buildup, Adult The ears produce a substance called earwax that helps keep bacteria out of the ear and protects the skin in the ear canal. Occasionally, earwax can build up in the ear and cause discomfort or hearing loss. What are the causes? This condition is caused by a buildup of earwax. Ear canals are self-cleaning. Ear wax is made in the outer part of the ear canal and generally falls out in small amounts over time. When the self-cleaning mechanism is not working, earwax builds up and can cause decreased hearing and discomfort. Attempting to clean ears with cotton swabs can push the earwax deep into the ear canal and cause decreased hearing and pain. What increases the risk? This condition is more likely to develop in people who: Clean their ears often with cotton swabs. Pick at their ears. Use earplugs or in-ear headphones often, or wear hearing aids. The following factors may also make you more likely to develop this condition: Being male. Being of older age. Naturally producing more earwax. Having narrow ear canals. Having earwax that is overly thick or sticky. Having excess hair in the ear canal. Having eczema. Being dehydrated. What are the signs or symptoms? Symptoms of this condition include: Reduced or muffled hearing. A feeling of fullness in the ear or feeling that the ear is plugged. Fluid coming from the ear. Ear pain or an itchy ear. Ringing in the ear. Coughing. Balance problems. An obvious piece of earwax that can be seen inside the ear canal. How is this diagnosed? This condition may be diagnosed based on: Your symptoms. Your medical history. An ear exam. During the exam, your health care provider will look into your ear with an instrument called an otoscope. You may have tests, including a  hearing test. How is this treated? This condition may be treated by: Using ear drops to soften the earwax. Having the earwax removed by a health care provider. The health care provider may: Flush the ear with water. Use an instrument that has a loop on the end (curette). Use a suction device. Having surgery to remove the wax buildup. This may be done in severe cases. Follow these instructions at home:  Take over-the-counter and prescription medicines only as told by your health care provider. Do not put any objects, including cotton swabs, into your ear. You can clean the opening of your ear canal with a washcloth or facial tissue. Follow instructions from your health care provider about cleaning your ears. Do not overclean your ears. Drink enough fluid to keep your urine pale yellow. This will help to thin the earwax. Keep all follow-up visits as told. If earwax builds up in your ears often or if you use hearing aids, consider seeing your health care provider for routine, preventive ear cleanings. Ask your health care provider how often you should schedule your cleanings. If you have hearing aids, clean them according to instructions from the manufacturer and your health care provider. Contact a health care provider if: You have ear pain. You develop a fever. You have pus or other fluid coming from your ear. You have hearing loss. You have ringing in your ears that does not go away. You feel like the room is spinning (vertigo). Your symptoms do not improve with treatment. Get help right away  if: You have bleeding from the affected ear. You have severe ear pain. Summary Earwax can build up in the ear and cause discomfort or hearing loss. The most common symptoms of this condition include reduced or muffled hearing, a feeling of fullness in the ear, or feeling that the ear is plugged. This condition may be diagnosed based on your symptoms, your medical history, and an ear exam. This  condition may be treated by using ear drops to soften the earwax or by having the earwax removed by a health care provider. Do not put any objects, including cotton swabs, into your ear. You can clean the opening of your ear canal with a washcloth or facial tissue. This information is not intended to replace advice given to you by your health care provider. Make sure you discuss any questions you have with your health care provider. Document Revised: 08/07/2019 Document Reviewed: 08/07/2019 Elsevier Patient Education  [place].

## 2021-04-08 NOTE — Progress Notes (Addendum)
Subjective  CC:  Chief Complaint  Patient presents with   Ear Fullness    Left ear, clogged for a month   Same day acute visit; PCP not available. New pt to me. Chart reviewed.   HPI: Joseph Hayes is a 80 y.o. male who presents to the office today to address the problems listed above in the chief complaint. Left ear is clogged  for a month w/o pain or trauma. Tinnitus Has had impactions in past.   Assessment  1. Bilateral impacted cerumen   2. Tinnitus of left ear      Plan  Cerumen impaction:  resolved on left, partially resolved on right. Rec debrox and prn return for irrigation. Further eval if tinnitus persists  Follow up: as needed  05/08/2021  No orders of the defined types were placed in this encounter.  No orders of the defined types were placed in this encounter.     I reviewed the patients updated PMH, FH, and SocHx.    Patient Active Problem List   Diagnosis Date Noted   Recurrent UTI 05/17/2017   Low vitamin D level 03/01/2017   Eczema 07/10/2014   Psoriasis and similar disorders 07/23/2013   Vitamin B12 deficiency 10/17/2008   CAD (coronary artery disease) 05/23/2008   GERD 08/24/2007   UTI (urinary tract infection) 01/09/2007   Hyperlipidemia 01/05/2007   Essential hypertension 01/05/2007   Spinal cord infarction ([place]) 01/05/2007   Current Meds  Medication Sig   acetaminophen (TYLENOL) 500 MG tablet Take 1,000 mg by mouth every 6 (six) hours as needed for mild pain.   aspirin EC 81 MG tablet Take 81 mg by mouth every other day.   folic acid (FOLVITE) 1 MG tablet TAKE 1 TABLET BY MOUTH EVERY DAY   meclizine (ANTIVERT) 25 MG tablet Take 1 tablet (25 mg total) by mouth 3 (three) times daily as needed.   metoprolol tartrate (LOPRESSOR) 25 MG tablet TAKE 0.5 TABLETS (12.5 MG TOTAL) BY MOUTH 2 (TWO) TIMES DAILY.   nitrofurantoin (MACRODANTIN) 100 MG capsule Take 100 mg by mouth at bedtime.   omeprazole (PRILOSEC) 20 MG capsule Take 1 capsule (20 mg  total) by mouth daily.   simvastatin (ZOCOR) 40 MG tablet TAKE 1 TABLET BY MOUTH EVERYDAY AT BEDTIME   triamcinolone cream (KENALOG) 0.1 % APPLY 1 APPLICATION TOPICALLY 2 (TWO) TIMES DAILY. FOR 7-10 DAYS MAX WITH FLARE UP   vitamin B-12 (CYANOCOBALAMIN) 500 MCG tablet TAKE 1 TABLET BY MOUTH DAILY    Allergies: Patient has No Known Allergies. Family History: Patient family history includes Alcohol abuse in an other family member; Heart disease in his father; Hyperlipidemia in an other family member; Hypertension in an other family member. Social History:  Patient  reports that he quit smoking about 46 years ago. His smoking use included cigarettes. He has quit using smokeless tobacco. He reports that he does not drink alcohol and does not use drugs.  Review of Systems: Constitutional: Negative for fever malaise or anorexia Cardiovascular: negative for chest pain Respiratory: negative for SOB or persistent cough Gastrointestinal: negative for abdominal pain  Objective  Vitals: BP (!) 146/84   Pulse 60   Temp 98.3 F (36.8 C) (Temporal)   Ht 5\' 9"  (1.753 m)   SpO2 95%   BMI 25.99 kg/m  General: no acute distress , A&Ox3, in wheel chair TM: both obstructed with cerumen.  S/p irrigation by CMA  Procedure: Cerumen Disimpaction Pt gave verbal consent to irrigation and manual  cerumen impaction. The patient had a large amount of cerumen in the external auditory canal(s): bilateral Ear wax softener was not used prior to the lavage. Ear lavage was performed on bilateral ear(s) by Vassie Moselle, CMA Curettage by provider was performed in addition. I removed large amount of old wax from left ear, and some from right with lighted curette. There were no complications and following the disimpaction the tympanic membrane was visible on left. There is hard wax obstructing the TM on right. He tolerated the procedure well.     Commons side effects, risks, benefits, and alternatives for  medications and treatment plan prescribed today were discussed, and the patient expressed understanding of the given instructions. Patient is instructed to call or message via MyChart if he/she has any questions or concerns regarding our treatment plan. No barriers to understanding were identified. We discussed Red Flag symptoms and signs in detail. Patient expressed understanding regarding what to do in case of urgent or emergency type symptoms.  Medication list was reconciled, printed and provided to the patient in AVS. Patient instructions and summary information was reviewed with the patient as documented in the AVS. This note was prepared with assistance of Dragon voice recognition software. Occasional wrong-word or sound-a-like substitutions may have occurred due to the inherent limitations of voice recognition software  This visit occurred during the SARS-CoV-2 public health emergency.  Safety protocols were in place, including screening questions prior to the visit, additional usage of staff PPE, and extensive cleaning of exam room while observing appropriate contact time as indicated for disinfecting solutions.

## 2021-05-06 NOTE — Progress Notes (Signed)
Phone [phone] In person visit   Subjective:   Joseph Hayes is a 81 y.o. year old very pleasant male patient who presents for/with See problem oriented charting Chief Complaint  Patient presents with   Follow-up   left ear pain    This visit occurred during the SARS-CoV-2 public health emergency.  Safety protocols were in place, including screening questions prior to the visit, additional usage of staff PPE, and extensive cleaning of exam room while observing appropriate contact time as indicated for disinfecting solutions.   Past Medical History-  Patient Active Problem List   Diagnosis Date Noted   Recurrent UTI 05/17/2017    Priority: High   CAD (coronary artery disease) 05/23/2008    Priority: High   Spinal cord infarction ([place]) 01/05/2007    Priority: High   Hyperlipidemia 01/05/2007    Priority: Medium    Essential hypertension 01/05/2007    Priority: Medium    Eczema 07/10/2014    Priority: Low   Psoriasis and similar disorders 07/23/2013    Priority: Low   Vitamin B12 deficiency 10/17/2008    Priority: Low   GERD 08/24/2007    Priority: Low   UTI (urinary tract infection) 01/09/2007    Priority: Low   Low vitamin D level 03/01/2017    Medications- reviewed and updated Current Outpatient Medications  Medication Sig Dispense Refill   acetaminophen (TYLENOL) 500 MG tablet Take 1,000 mg by mouth every 6 (six) hours as needed for mild pain.     aspirin EC 81 MG tablet Take 81 mg by mouth every other day.     nitrofurantoin (MACRODANTIN) 100 MG capsule Take 100 mg by mouth at bedtime.     folic acid (FOLVITE) 1 MG tablet Take 1 tablet (1 mg total) by mouth daily. 90 tablet 3   meclizine (ANTIVERT) 25 MG tablet Take 1 tablet (25 mg total) by mouth 3 (three) times daily as needed. 30 tablet 5   metoprolol tartrate (LOPRESSOR) 25 MG tablet Take 0.5 tablets (12.5 mg total) by mouth 2 (two) times daily. 90 tablet 3   omeprazole (PRILOSEC) 20 MG capsule Take 1  capsule (20 mg total) by mouth daily. 90 capsule 3   simvastatin (ZOCOR) 40 MG tablet Take 1 tablet (40 mg total) by mouth daily at 6 PM. 90 tablet 3   triamcinolone cream (KENALOG) 0.1 % Apply 1 application topically 2 (two) times daily. For 7-10 days max with flare up 454 g 1   vitamin B-12 (CYANOCOBALAMIN) 500 MCG tablet Take 1 tablet (500 mcg total) by mouth daily. 90 tablet 3   No current facility-administered medications for this visit.     Objective:  BP 120/78    Pulse (!) 57    Temp 98.7 F (37.1 C)    Ht 5\' 9"  (1.753 m)    Wt 180 lb (81.6 kg)    SpO2 96%    BMI 26.58 kg/m  Gen: NAD, resting comfortably Narrow ear canals- some cerumen both ears but TM not occluded and appears normal CV: RRR no murmurs rubs or gallops Lungs: CTAB no crackles, wheeze, rhonchi Ext: 1+ edema Skin: warm, dry Neuro: wheelchair bound,     Assessment and Plan   # Spinal cord infarction #history low b12/sounds like macrocytosis S:Patient with history of spinal cord infarction in 2006. Had prolonged rehab at [facility] in [place]. This spans from C4- T1. He had been wheelchair bound since this time due to quadriplegic.   -  was advised folic acid long term and b12- history of low b12 A/P: no worsening weakness- continue to follow and certainly want to avoid strokes in future to avoid additional neuro deficits.    #hypertension S: compliant with  metoprolol 12.5mg  BID BP Readings from Last 3 Encounters:  05/08/21 120/78  04/08/21 (!) 146/84  07/18/19 136/84  A/P: Controlled. Continue current medications.   #hyperlipidemia/CAD S: compliant with simvastatin 40mg . Also takes aspirin every other day   No chest pain or shortness of breath. Still riding bike stationary every other day.  Lab Results  Component Value Date   CHOL 154 07/18/2019   HDL 44.90 07/18/2019   LDLCALC 87 07/18/2019   LDLDIRECT 106.0 08/30/2017   TRIG 112.0 07/18/2019   CHOLHDL 3 07/18/2019   A/P: asymptomatic CAD.  Lipids mildly high in past- update lipids but for now continue current meds -plans to call to schedule cardiology - wants to see Dr. Johnsie Cancel  #Vitamin D deficiency S: Medication: stopped taking his otc D- then restarted recently  Last vitamin D Lab Results  Component Value Date   VD25OH 27.30 (L) 07/18/2019  A/P: update levels- hopefully improved  #left ear pain-intermittent itching has improved. Prior irrigation.   #BPPV- intermittent issues- wants refill on meclizine  #UTI prophylaxis- still on nitrofurantoin with Dr. Gloriann Loan  #GERD- omeprazole 20 mg daily working well. Could try pepcid OTC Instead- but does get occasional breakthrough  #Eczema- intermittent issues- needs triamcinolone refill. Knows to use 7-10 days max  Recommended follow up: 6-12 months for physical Future Appointments  Date Time Provider [place]  10/30/2021  9:40 AM Yong Channel Brayton Mars, MD [id number]    Lab/Order associations:   ICD-10-CM   1. Hyperlipidemia, unspecified hyperlipidemia type  E78.5 CBC with Differential/Platelet    Comprehensive metabolic panel    Lipid panel    2. Essential hypertension  I10 CBC with Differential/Platelet    Comprehensive metabolic panel    Lipid panel    3. Spinal cord infarction ([place])  G95.11     4. Coronary artery disease involving native coronary artery of native heart without angina pectoris  I25.10 CBC with Differential/Platelet    Comprehensive metabolic panel    Lipid panel    5. B12 deficiency  E53.8 Vitamin B12    6. Encounter for hepatitis C screening test for low risk patient  Z11.59 Hepatitis C antibody    7. Vitamin B12 deficiency  E53.8     8. Low vitamin D level  R79.89 Vitamin D (25 hydroxy)      Meds ordered this encounter  Medications   folic acid (FOLVITE) 1 MG tablet    Sig: Take 1 tablet (1 mg total) by mouth daily.    Dispense:  90 tablet    Refill:  3   meclizine (ANTIVERT) 25 MG tablet    Sig: Take 1 tablet (25 mg total)  by mouth 3 (three) times daily as needed.    Dispense:  30 tablet    Refill:  5   metoprolol tartrate (LOPRESSOR) 25 MG tablet    Sig: Take 0.5 tablets (12.5 mg total) by mouth 2 (two) times daily.    Dispense:  90 tablet    Refill:  3   omeprazole (PRILOSEC) 20 MG capsule    Sig: Take 1 capsule (20 mg total) by mouth daily.    Dispense:  90 capsule    Refill:  3   simvastatin (ZOCOR) 40 MG tablet    Sig: Take  1 tablet (40 mg total) by mouth daily at 6 PM.    Dispense:  90 tablet    Refill:  3   triamcinolone cream (KENALOG) 0.1 %    Sig: Apply 1 application topically 2 (two) times daily. For 7-10 days max with flare up    Dispense:  454 g    Refill:  1   vitamin B-12 (CYANOCOBALAMIN) 500 MCG tablet    Sig: Take 1 tablet (500 mcg total) by mouth daily.    Dispense:  90 tablet    Refill:  3    I,Jada Bradford,acting as a scribe for Garret Reddish, MD.,have documented all relevant documentation on the behalf of Garret Reddish, MD,as directed by  Garret Reddish, MD while in the presence of Garret Reddish, MD.  I, Garret Reddish, MD, have reviewed all documentation for this visit. The documentation on 05/08/21 for the exam, diagnosis, procedures, and orders are all accurate and complete.  Return precautions advised.  Garret Reddish, MD

## 2021-05-08 ENCOUNTER — Other Ambulatory Visit: Payer: Self-pay

## 2021-05-08 ENCOUNTER — Encounter: Payer: Self-pay | Admitting: Family Medicine

## 2021-05-08 ENCOUNTER — Ambulatory Visit (INDEPENDENT_AMBULATORY_CARE_PROVIDER_SITE_OTHER): Payer: Medicare Other | Admitting: Family Medicine

## 2021-05-08 VITALS — BP 120/78 | HR 57 | Temp 98.7°F | Ht 69.0 in | Wt 180.0 lb

## 2021-05-08 DIAGNOSIS — I2510 Atherosclerotic heart disease of native coronary artery without angina pectoris: Secondary | ICD-10-CM

## 2021-05-08 DIAGNOSIS — G9511 Acute infarction of spinal cord (embolic) (nonembolic): Secondary | ICD-10-CM | POA: Diagnosis not present

## 2021-05-08 DIAGNOSIS — I10 Essential (primary) hypertension: Secondary | ICD-10-CM

## 2021-05-08 DIAGNOSIS — Z1159 Encounter for screening for other viral diseases: Secondary | ICD-10-CM | POA: Diagnosis not present

## 2021-05-08 DIAGNOSIS — E785 Hyperlipidemia, unspecified: Secondary | ICD-10-CM | POA: Diagnosis not present

## 2021-05-08 DIAGNOSIS — R7989 Other specified abnormal findings of blood chemistry: Secondary | ICD-10-CM

## 2021-05-08 DIAGNOSIS — E538 Deficiency of other specified B group vitamins: Secondary | ICD-10-CM | POA: Diagnosis not present

## 2021-05-08 LAB — CBC WITH DIFFERENTIAL/PLATELET
Basophils Absolute: 0.1 10*3/uL (ref 0.0–0.1)
Basophils Relative: 0.8 % (ref 0.0–3.0)
Eosinophils Absolute: 0.4 10*3/uL (ref 0.0–0.7)
Eosinophils Relative: 4.9 % (ref 0.0–5.0)
HCT: 39.3 % (ref 39.0–52.0)
Hemoglobin: 12.9 g/dL — ABNORMAL LOW (ref 13.0–17.0)
Lymphocytes Relative: 15.7 % (ref 12.0–46.0)
Lymphs Abs: 1.2 10*3/uL (ref 0.7–4.0)
MCHC: 32.8 g/dL (ref 30.0–36.0)
MCV: 86.7 fl (ref 78.0–100.0)
Monocytes Absolute: 0.6 10*3/uL (ref 0.1–1.0)
Monocytes Relative: 8.2 % (ref 3.0–12.0)
Neutro Abs: 5.3 10*3/uL (ref 1.4–7.7)
Neutrophils Relative %: 70.4 % (ref 43.0–77.0)
Platelets: 223 10*3/uL (ref 150.0–400.0)
RBC: 4.53 Mil/uL (ref 4.22–5.81)
RDW: 13.2 % (ref 11.5–15.5)
WBC: 7.6 10*3/uL (ref 4.0–10.5)

## 2021-05-08 LAB — VITAMIN D 25 HYDROXY (VIT D DEFICIENCY, FRACTURES): VITD: 85.5 ng/mL (ref 30.00–100.00)

## 2021-05-08 LAB — VITAMIN B12: Vitamin B-12: 667 pg/mL (ref 211–911)

## 2021-05-08 LAB — COMPREHENSIVE METABOLIC PANEL
ALT: 16 U/L (ref 0–53)
AST: 18 U/L (ref 0–37)
Albumin: 4 g/dL (ref 3.5–5.2)
Alkaline Phosphatase: 88 U/L (ref 39–117)
BUN: 19 mg/dL (ref 6–23)
CO2: 25 mEq/L (ref 19–32)
Calcium: 9 mg/dL (ref 8.4–10.5)
Chloride: 104 mEq/L (ref 96–112)
Creatinine, Ser: 0.78 mg/dL (ref 0.40–1.50)
GFR: 85.44 mL/min (ref >60.00)
Glucose, Bld: 78 mg/dL (ref 70–99)
Potassium: 4.4 mEq/L (ref 3.5–5.1)
Sodium: 138 mEq/L (ref 135–145)
Total Bilirubin: 0.4 mg/dL (ref 0.2–1.2)
Total Protein: 6.4 g/dL (ref 6.0–8.3)

## 2021-05-08 LAB — LIPID PANEL
Cholesterol: 151 mg/dL (ref 0–200)
HDL: 44.2 mg/dL (ref >39.00)
LDL Cholesterol: 78 mg/dL (ref 0–99)
NonHDL: 106.9
Total CHOL/HDL Ratio: 3
Triglycerides: 143 mg/dL (ref 0.0–149.0)
VLDL: 28.6 mg/dL (ref 0.0–40.0)

## 2021-05-08 MED ORDER — TRIAMCINOLONE ACETONIDE 0.1 % EX CREA: 1.0000 "application " | TOPICAL_CREAM | Freq: Two times a day (BID) | CUTANEOUS | 1 refills | Status: DC

## 2021-05-08 MED ORDER — OMEPRAZOLE 20 MG PO CPDR: 20.0000 mg | DELAYED_RELEASE_CAPSULE | Freq: Every day | ORAL | 3 refills | Status: DC

## 2021-05-08 MED ORDER — VITAMIN B-12 500 MCG PO TABS: 500.0000 ug | ORAL_TABLET | Freq: Every day | ORAL | 3 refills | Status: DC

## 2021-05-08 MED ORDER — SIMVASTATIN 40 MG PO TABS
40.0000 mg | ORAL_TABLET | Freq: Every day | ORAL | 3 refills | Status: DC
Start: 2021-05-08 — End: 2022-06-23

## 2021-05-08 MED ORDER — METOPROLOL TARTRATE 25 MG PO TABS: 12.5000 mg | ORAL_TABLET | Freq: Two times a day (BID) | ORAL | 3 refills | Status: DC

## 2021-05-08 MED ORDER — FOLIC ACID 1 MG PO TABS: 1.0000 mg | ORAL_TABLET | Freq: Every day | ORAL | 3 refills | Status: DC

## 2021-05-08 MED ORDER — MECLIZINE HCL 25 MG PO TABS: 25.0000 mg | ORAL_TABLET | Freq: Three times a day (TID) | ORAL | 5 refills | Status: DC | PRN

## 2021-05-08 NOTE — Patient Instructions (Addendum)
Health Maintenance Due  Topic Date Due   Zoster Vaccines- Shingrix (1 of 2)   Please check with your pharmacy to see if they have the shingrix vaccine. If they do- please get this immunization and update [place] by phone call or mychart with dates you receive the vaccine  Never done   COVID-19 Vaccine (3 - Pfizer risk series)- declines, let us know if you change your mind 07/23/2019   Please stop by lab before you go If you have mychart- we will send your results within 3 business days of [place] receiving them.  If you do not have mychart- we will call you about results within 5 business days of [place] receiving them.  *please also note that you will see labs on mychart as soon as they post. I will later go in and write notes on them- will say "notes from Dr. Yong Channel"   Could try debrox if ear feels irritated OR Mineral oil for ear full of wax (or could do preventative once a week) Purchase mineral oil from laxative aisle Lay down on your side with ear that is bothering you facing up Use 3-4 drops with a dropper and place in ear for 30 seconds Place cotton swab outside of ear Turn to other side and allow this to drain Repeat 3-4 x a day Return to see [place] if not improving within a few days   Recommended follow up: 6-12 months for physical

## 2021-05-11 LAB — HEPATITIS C ANTIBODY
Hepatitis C Ab: NONREACTIVE
SIGNAL TO CUT-OFF: <0.02 (ref <1.00)

## 2021-06-09 ENCOUNTER — Other Ambulatory Visit: Payer: Self-pay

## 2021-06-11 DIAGNOSIS — H02051 Trichiasis without entropian right upper eyelid: Secondary | ICD-10-CM | POA: Diagnosis not present

## 2021-06-17 DIAGNOSIS — N318 Other neuromuscular dysfunction of bladder: Secondary | ICD-10-CM | POA: Diagnosis not present

## 2021-06-22 NOTE — Progress Notes (Signed)
CARDIOLOGY CONSULT NOTE       Patient ID: Joseph Hayes MRN: [id number] DOB/AGE: 12-16-1940 81 y.o.  Admit date: (Not on file) Referring Physician: Yong Channel Primary Physician: Marin Olp, MD Primary Cardiologist: New Reason for Consultation: CAD  Active Problems:   * No active hospital problems. *   HPI:  81 y.o. referred by Dr Yong Channel for CAD. I see his wife Joseph Hayes as well. Previously seen by Dr Stanford Breed History of remote smoking, HLD, HTN.  Vague history of CAD not documented in chart/Epic with MI/stents in 1999 by Dr Pulsipher Stroke in 2006 with spinal infarction and paralysis now in wheelchair Felt there was some relationship to starting Lipitor during stroke Has been on zocor with no issues Currently on ASA and lopressor for his CAD TAkes ASA qod due to bruising  LDL 78 05/08/21   Found his cath note from 02/05/98 had acute inferior MI with 4 mm  NIR stent to proximal RCA  With no significant left sided Dx Needs updated EF by echo Noted at time of MI to be "severely decreased" Needed to be intubated and sedated for procedure due to agitation and on dopamine   No chest pain Mild edema in left foot dependant Wife looks after him well He is active can move arms a bit Picks up chicken eggs daily and has a standing machine Uses In/Out foley 4 x / day  ROS All other systems reviewed and negative except as noted above  Past Medical History:  Diagnosis Date   BPH (benign prostatic hypertrophy)    Coronary artery disease    ERECTILE DYSFUNCTION, ORGANIC 05/16/2007   H/O: CVA (cardiovascular accident)    Hiatal hernia    wheelchair bound stroke residual   History of shingles 04/07/2009   Treated shingles 2010 s/p immunization       Hyperlipidemia    Hypertension    MI, old    SHINGLES 04/07/2009   Stroke ([place]) 2006    Family History  Problem Relation Age of Onset   Hypertension Other    Alcohol abuse Other    Hyperlipidemia Other    Heart disease Father     Social  History   Socioeconomic History   Marital status: Married    Spouse name: Not on file   Number of children: Not on file   Years of education: Not on file   Highest education level: Not on file  Occupational History   Occupation: self employed  Tobacco Use   Smoking status: Former    Types: Cigarettes    Quit date: 05/03/1974    Years since quitting: 47.1   Smokeless tobacco: Former  Substance and Sexual Activity   Alcohol use: No   Drug use: No   Sexual activity: Yes  Other Topics Concern   Not on file  Social History Narrative   Married (wife patient of Dr. Yong Channel). 2 sons. 1 granddaughter (2001).       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 Packs over 1000 eggs a day.       Hobbies: time with wife and working at [employer].    Social Determinants of Health   Financial Resource Strain: Not on file  Food Insecurity: Not on file  Transportation Needs: Not on file  Physical Activity: Not on file  Stress: Not on file  Social Connections: Not on file  Intimate Partner Violence: Not on file    Past Surgical History:  Procedure Laterality Date  CARDIAC CATHETERIZATION     CORONARY ANGIOPLASTY        Current Outpatient Medications:    acetaminophen (TYLENOL) 500 MG tablet, Take 1,000 mg by mouth every 6 (six) hours as needed for mild pain., Disp: , Rfl:    aspirin EC 81 MG tablet, Take 81 mg by mouth every other day., Disp: , Rfl:    folic acid (FOLVITE) 1 MG tablet, Take 1 tablet (1 mg total) by mouth daily., Disp: 90 tablet, Rfl: 3   meclizine (ANTIVERT) 25 MG tablet, Take 1 tablet (25 mg total) by mouth 3 (three) times daily as needed., Disp: 30 tablet, Rfl: 5   metoprolol tartrate (LOPRESSOR) 25 MG tablet, Take 0.5 tablets (12.5 mg total) by mouth 2 (two) times daily., Disp: 90 tablet, Rfl: 3   nitrofurantoin (MACRODANTIN) 100 MG capsule, Take 100 mg by mouth at bedtime., Disp: , Rfl:    omeprazole (PRILOSEC) 20 MG capsule, Take 1 capsule (20 mg  total) by mouth daily., Disp: 90 capsule, Rfl: 3   simvastatin (ZOCOR) 40 MG tablet, Take 1 tablet (40 mg total) by mouth daily at 6 PM., Disp: 90 tablet, Rfl: 3   triamcinolone cream (KENALOG) 0.1 %, Apply 1 application topically 2 (two) times daily. For 7-10 days max with flare up, Disp: 454 g, Rfl: 1   vitamin B-12 (CYANOCOBALAMIN) 500 MCG tablet, Take 1 tablet (500 mcg total) by mouth daily., Disp: 90 tablet, Rfl: 3    Physical Exam: There were no vitals taken for this visit.    Affect appropriate Healthy:  appears stated age 81: normal Neck supple with no adenopathy JVP normal no bruits no thyromegaly Lungs clear with no wheezing and good diaphragmatic motion Heart:  S1/S2 no murmur, no rub, gallop or click PMI normal Abdomen: benighn, BS positve, no tenderness, no AAA no bruit.  No HSM or HJR Distal pulses intact with no bruits No edema Neuro quadriplegic  Skin warm and dry No muscular weakness   Labs:   Lab Results  Component Value Date   WBC 7.6 05/08/2021   HGB 12.9 (L) 05/08/2021   HCT 39.3 05/08/2021   MCV 86.7 05/08/2021   PLT 223.0 05/08/2021   No results for input(s): NA, K, CL, CO2, BUN, CREATININE, CALCIUM, PROT, BILITOT, ALKPHOS, ALT, AST, GLUCOSE in the last 168 hours.  Invalid input(s): LABALBU No results found for: CKTOTAL, CKMB, CKMBINDEX, TROPONINI  Lab Results  Component Value Date   CHOL 151 05/08/2021   CHOL 154 07/18/2019   CHOL 139 02/06/2018   Lab Results  Component Value Date   HDL 44.20 05/08/2021   HDL 44.90 07/18/2019   HDL 40.70 02/06/2018   Lab Results  Component Value Date   LDLCALC 78 05/08/2021   LDLCALC 87 07/18/2019   LDLCALC 72 02/06/2018   Lab Results  Component Value Date   TRIG 143.0 05/08/2021   TRIG 112.0 07/18/2019   TRIG 130.0 02/06/2018   Lab Results  Component Value Date   CHOLHDL 3 05/08/2021   CHOLHDL 3 07/18/2019   CHOLHDL 3 02/06/2018   Lab Results  Component Value Date   LDLDIRECT 106.0  08/30/2017   LDLDIRECT 77.0 06/04/2015   LDLDIRECT 90.2 02/16/2010      Radiology: No results found.  EKG: 2019 SR rate 73 normal  06/26/2021 NSR rate 54 normal    ASSESSMENT AND PLAN:    CAD:  distant stent to proximal RCA in 1999 with no obstructive dx in left system Needs echo to  assess EF    HLD:  continue statin LDL acceptable 78  3.  HTN:  Well controlled.  Continue current medications and low sodium Dash type diet.    4. Quadriplegic:  f/u Dr Yong Channel has some good routines at home including standing chair and he works with chickens every day  5. GERD:  continue prilosec low carb diet   TTE  F/U in a year   Signed: Jenkins Rouge 06/26/2021, 10:52 AM

## 2021-06-26 ENCOUNTER — Ambulatory Visit: Payer: Medicare Other | Admitting: Cardiovascular Disease

## 2021-06-26 ENCOUNTER — Encounter: Payer: Self-pay | Admitting: Cardiovascular Disease

## 2021-06-26 ENCOUNTER — Other Ambulatory Visit: Payer: Self-pay

## 2021-06-26 VITALS — BP 126/76 | HR 54 | Ht 68.0 in | Wt 180.0 lb

## 2021-06-26 DIAGNOSIS — I10 Essential (primary) hypertension: Secondary | ICD-10-CM | POA: Diagnosis not present

## 2021-06-26 DIAGNOSIS — I2510 Atherosclerotic heart disease of native coronary artery without angina pectoris: Secondary | ICD-10-CM

## 2021-06-26 DIAGNOSIS — E782 Mixed hyperlipidemia: Secondary | ICD-10-CM

## 2021-06-26 NOTE — Patient Instructions (Addendum)
Medication Instructions:  °Your physician recommends that you continue on your current medications as directed. Please refer to the Current Medication list given to you today. ° °*If you need a refill on your cardiac medications before your next appointment, please call your pharmacy* ° ° °Lab Work: °If you have labs (blood work) drawn today and your tests are completely normal, you will receive your results only by: °MyChart Message (if you have MyChart) OR °A paper copy in the mail °If you have any lab test that is abnormal or we need to change your treatment, we will call you to review the results. ° ° °Testing/Procedures: °Your physician has requested that you have an echocardiogram. Echocardiography is a painless test that uses sound waves to create images of your heart. It provides your doctor with information about the size and shape of your heart and how well your heart’s chambers and valves are working. This procedure takes approximately one hour. There are no restrictions for this procedure. ° °Follow-Up: °At [facility], you and your health needs are our priority.  As part of our continuing mission to provide you with exceptional heart care, we have created designated Provider Care Teams.  These Care Teams include your primary Cardiologist (physician) and Advanced Practice Providers (APPs -  Physician Assistants and Nurse Practitioners) who all work together to provide you with the care you need, when you need it. ° °We recommend signing up for the patient portal called "MyChart".  Sign up information is provided on this After Visit Summary.  MyChart is used to connect with patients for Virtual Visits (Telemedicine).  Patients are able to view lab/test results, encounter notes, upcoming appointments, etc.  Non-urgent messages can be sent to your provider as well.   °To learn more about what you can do with MyChart, go to [email or web address].   ° °Your next appointment:   °12 month(s) ° °The format  for your next appointment:   °In Person ° °Provider:   °Peter Nishan, MD { ° °

## 2021-07-13 ENCOUNTER — Other Ambulatory Visit: Payer: Self-pay

## 2021-07-13 ENCOUNTER — Ambulatory Visit (HOSPITAL_COMMUNITY)
Admission: RE | Admit: 2021-07-13 | Discharge: 2021-07-13 | Disposition: A | Discharge status: Home / Self Care | Payer: Medicare Other | Source: Ambulatory Visit | Attending: Cardiovascular Disease | Admitting: Cardiovascular Disease

## 2021-07-13 DIAGNOSIS — I10 Essential (primary) hypertension: Secondary | ICD-10-CM | POA: Diagnosis not present

## 2021-07-13 DIAGNOSIS — I2510 Atherosclerotic heart disease of native coronary artery without angina pectoris: Secondary | ICD-10-CM | POA: Diagnosis not present

## 2021-07-13 LAB — ECHOCARDIOGRAM COMPLETE
AR max vel: 2.54 cm2
AV Peak grad: 6.9 mmHg
Ao pk vel: 1.31 m/s
Area-P 1/2: 3.34 cm2
S' Lateral: 3.7 cm

## 2021-07-13 NOTE — Progress Notes (Signed)
Echocardiogram ?2D Echocardiogram has been performed. ? ?Joseph Hayes ?07/13/2021, 3:48 PM ?

## 2021-07-13 NOTE — Progress Notes (Unsigned)
This encounter was created in error - please disregard.

## 2021-08-03 ENCOUNTER — Ambulatory Visit: Payer: Medicare Other | Admitting: Dermatology

## 2021-08-03 ENCOUNTER — Encounter: Payer: Self-pay | Admitting: Dermatology

## 2021-08-03 DIAGNOSIS — Z1283 Encounter for screening for malignant neoplasm of skin: Secondary | ICD-10-CM | POA: Diagnosis not present

## 2021-08-03 DIAGNOSIS — L820 Inflamed seborrheic keratosis: Secondary | ICD-10-CM | POA: Diagnosis not present

## 2021-08-03 DIAGNOSIS — D485 Neoplasm of uncertain behavior of skin: Secondary | ICD-10-CM

## 2021-08-03 DIAGNOSIS — L821 Other seborrheic keratosis: Secondary | ICD-10-CM | POA: Diagnosis not present

## 2021-08-03 DIAGNOSIS — D3617 Benign neoplasm of peripheral nerves and autonomic nervous system of trunk, unspecified: Secondary | ICD-10-CM

## 2021-08-03 DIAGNOSIS — L570 Actinic keratosis: Secondary | ICD-10-CM

## 2021-08-03 DIAGNOSIS — D369 Benign neoplasm, unspecified site: Secondary | ICD-10-CM

## 2021-08-03 NOTE — Patient Instructions (Signed)

## 2021-08-22 ENCOUNTER — Encounter: Payer: Self-pay | Admitting: Dermatology

## 2021-08-22 NOTE — Progress Notes (Signed)
? ?  Follow-Up Visit ?  ?Subjective  ?Joseph Hayes is a 81 y.o. male who presents for the following: Annual Exam (Few places on face- dark spots). ? ?Annual skin check, several places concern patient ?Location:  ?Duration:  ?Quality:  ?Associated Signs/Symptoms: ?Modifying Factors:  ?Severity:  ?Timing: ?Context:  ? ?Objective  ?Well appearing patient in no apparent distress; mood and affect are within normal limits. ?Waist up exam: No atypical pigmented lesions (all checked with dermoscopy). ? ?Left Anterior Neck, Left Temporal Scalp, Mid Back (2) ?Multiple 3 to 8 mm noninflamed brown textured flattopped keratotic papules ? ?Right Supraclavicular Area ?Irritated horn versus filiform wart ? ?Mid Parietal Scalp, Right Forearm - Posterior ?2 gritty to hornlike 5 mm pink crusts ? ?Left Temporal Scalp ?8 Millimeters inflamed crust, dermoscopy favors irritated keratoses over SCCA but patient would like biopsies ? ? ? ? ? ? ?Left Parotid Area ? ? ? ? ? ? ?All skin waist up examined. ? ? ?Assessment & Plan  ? ? ?Encounter for screening for malignant neoplasm of skin ? ?Annual skin examination ? ?Seborrheic keratosis (4) ?Mid Back (2); Left Anterior Neck; Left Temporal Scalp ? ?No intervention necessary ? ?Papilloma ?Right Supraclavicular Area ? ?No treatment necessary ? ?AK (actinic keratosis) (2) ?Right Forearm - Posterior; Mid Parietal Scalp ? ?Destruction of lesion - Mid Parietal Scalp, Right Forearm - Posterior ?Complexity: simple   ?Destruction method: cryotherapy   ?Informed consent: discussed and consent obtained   ?Timeout:  patient name, date of birth, surgical site, and procedure verified ?Lesion destroyed using liquid nitrogen: Yes   ?Cryotherapy cycles:  3 ?Outcome: patient tolerated procedure well with no complications   ?Post-procedure details: wound care instructions given   ? ?Neoplasm of uncertain behavior of skin (2) ?Left Temporal Scalp ? ?Skin / nail biopsy ?Type of biopsy: tangential   ?Informed  consent: discussed and consent obtained   ?Timeout: patient name, date of birth, surgical site, and procedure verified   ?Anesthesia: the lesion was anesthetized in a standard fashion   ?Anesthetic:  1% lidocaine w/ epinephrine 1-100,000 local infiltration ?Instrument used: flexible razor blade   ?Hemostasis achieved with: aluminum chloride and electrodesiccation   ?Outcome: patient tolerated procedure well   ?Post-procedure details: wound care instructions given   ? ?Specimen 1 - Surgical pathology ?Differential Diagnosis: r/o sk- cautery only ? ?Check Margins: No ? ?Left Parotid Area ? ?Skin / nail biopsy ?Type of biopsy: tangential   ?Informed consent: discussed and consent obtained   ?Timeout: patient name, date of birth, surgical site, and procedure verified   ?Anesthesia: the lesion was anesthetized in a standard fashion   ?Anesthetic:  1% lidocaine w/ epinephrine 1-100,000 local infiltration ?Instrument used: flexible razor blade   ?Hemostasis achieved with: aluminum chloride and electrodesiccation   ?Outcome: patient tolerated procedure well   ?Post-procedure details: wound care instructions given   ? ?Specimen 2 - Surgical pathology ?Differential Diagnosis: r/o sk- cautery only ? ?Check Margins: No ? ?After shave biopsy base was lightly cauterized ? ? ? ? ? ?I, Lavonna Monarch, MD, have reviewed all documentation for this visit.  The documentation on 08/22/21 for the exam, diagnosis, procedures, and orders are all accurate and complete. ?

## 2021-08-28 DIAGNOSIS — Z87440 Personal history of urinary (tract) infections: Secondary | ICD-10-CM | POA: Diagnosis not present

## 2021-08-28 DIAGNOSIS — G9511 Acute infarction of spinal cord (embolic) (nonembolic): Secondary | ICD-10-CM | POA: Diagnosis not present

## 2021-08-28 DIAGNOSIS — R339 Retention of urine, unspecified: Secondary | ICD-10-CM | POA: Diagnosis not present

## 2021-09-21 DIAGNOSIS — H02051 Trichiasis without entropian right upper eyelid: Secondary | ICD-10-CM | POA: Diagnosis not present

## 2021-10-15 ENCOUNTER — Encounter: Payer: Medicare Other | Admitting: Family Medicine

## 2021-10-30 ENCOUNTER — Encounter: Payer: Medicare Other | Admitting: Family Medicine

## 2021-12-14 DIAGNOSIS — Z87440 Personal history of urinary (tract) infections: Secondary | ICD-10-CM | POA: Diagnosis not present

## 2021-12-14 DIAGNOSIS — R339 Retention of urine, unspecified: Secondary | ICD-10-CM | POA: Diagnosis not present

## 2021-12-14 DIAGNOSIS — G9511 Acute infarction of spinal cord (embolic) (nonembolic): Secondary | ICD-10-CM | POA: Diagnosis not present

## 2021-12-23 DIAGNOSIS — H02051 Trichiasis without entropian right upper eyelid: Secondary | ICD-10-CM | POA: Diagnosis not present

## 2022-01-25 ENCOUNTER — Encounter: Payer: Self-pay | Admitting: *Deleted

## 2022-03-29 ENCOUNTER — Other Ambulatory Visit: Payer: Self-pay | Admitting: Family Medicine

## 2022-04-01 DIAGNOSIS — R339 Retention of urine, unspecified: Secondary | ICD-10-CM | POA: Diagnosis not present

## 2022-04-01 DIAGNOSIS — G9511 Acute infarction of spinal cord (embolic) (nonembolic): Secondary | ICD-10-CM | POA: Diagnosis not present

## 2022-04-01 DIAGNOSIS — Z87440 Personal history of urinary (tract) infections: Secondary | ICD-10-CM | POA: Diagnosis not present

## 2022-04-02 DIAGNOSIS — H02051 Trichiasis without entropian right upper eyelid: Secondary | ICD-10-CM | POA: Diagnosis not present

## 2022-04-15 ENCOUNTER — Encounter: Payer: Self-pay | Admitting: *Deleted

## 2022-05-13 ENCOUNTER — Other Ambulatory Visit: Payer: Self-pay | Admitting: Family Medicine

## 2022-06-21 ENCOUNTER — Other Ambulatory Visit: Payer: Self-pay | Admitting: Family Medicine

## 2022-06-23 ENCOUNTER — Telehealth: Payer: Self-pay | Admitting: Family Medicine

## 2022-06-23 MED ORDER — OMEPRAZOLE 20 MG PO CPDR: 20.0000 mg | DELAYED_RELEASE_CAPSULE | Freq: Every day | ORAL | 3 refills | Status: DC

## 2022-06-23 MED ORDER — METOPROLOL TARTRATE 25 MG PO TABS: ORAL_TABLET | ORAL | 3 refills | Status: DC

## 2022-06-23 MED ORDER — VITAMIN B-12 500 MCG PO TABS: 500.0000 ug | ORAL_TABLET | Freq: Every day | ORAL | 3 refills | Status: AC

## 2022-06-23 MED ORDER — SIMVASTATIN 40 MG PO TABS: 40.0000 mg | ORAL_TABLET | Freq: Every day | ORAL | 3 refills | Status: DC

## 2022-06-23 NOTE — Telephone Encounter (Signed)
Pt made CPE appt in June, that was the first available but pt needs some refills before then. Please advise.

## 2022-06-23 NOTE — Telephone Encounter (Signed)
Refills sent to pharmacy on file.

## 2022-07-19 DIAGNOSIS — Z87440 Personal history of urinary (tract) infections: Secondary | ICD-10-CM | POA: Diagnosis not present

## 2022-07-19 DIAGNOSIS — R339 Retention of urine, unspecified: Secondary | ICD-10-CM | POA: Diagnosis not present

## 2022-07-19 DIAGNOSIS — G9511 Acute infarction of spinal cord (embolic) (nonembolic): Secondary | ICD-10-CM | POA: Diagnosis not present

## 2022-07-22 DIAGNOSIS — H02051 Trichiasis without entropian right upper eyelid: Secondary | ICD-10-CM | POA: Diagnosis not present

## 2022-08-04 ENCOUNTER — Ambulatory Visit: Payer: Medicare Other | Admitting: Dermatology

## 2022-08-26 DIAGNOSIS — N3020 Other chronic cystitis without hematuria: Secondary | ICD-10-CM | POA: Diagnosis not present

## 2022-08-26 DIAGNOSIS — R338 Other retention of urine: Secondary | ICD-10-CM | POA: Diagnosis not present

## 2022-08-26 DIAGNOSIS — N318 Other neuromuscular dysfunction of bladder: Secondary | ICD-10-CM | POA: Diagnosis not present

## 2022-10-12 DIAGNOSIS — H02051 Trichiasis without entropian right upper eyelid: Secondary | ICD-10-CM | POA: Diagnosis not present

## 2022-10-14 ENCOUNTER — Encounter: Payer: Self-pay | Admitting: Family Medicine

## 2022-10-14 ENCOUNTER — Ambulatory Visit (INDEPENDENT_AMBULATORY_CARE_PROVIDER_SITE_OTHER): Payer: Medicare Other | Admitting: Family Medicine

## 2022-10-14 VITALS — BP 124/72 | HR 63 | Temp 98.6°F | Ht 68.0 in | Wt 176.0 lb

## 2022-10-14 DIAGNOSIS — E538 Deficiency of other specified B group vitamins: Secondary | ICD-10-CM | POA: Diagnosis not present

## 2022-10-14 DIAGNOSIS — I2510 Atherosclerotic heart disease of native coronary artery without angina pectoris: Secondary | ICD-10-CM | POA: Diagnosis not present

## 2022-10-14 DIAGNOSIS — Z0000 Encounter for general adult medical examination without abnormal findings: Secondary | ICD-10-CM | POA: Diagnosis not present

## 2022-10-14 DIAGNOSIS — R7989 Other specified abnormal findings of blood chemistry: Secondary | ICD-10-CM

## 2022-10-14 DIAGNOSIS — E782 Mixed hyperlipidemia: Secondary | ICD-10-CM | POA: Diagnosis not present

## 2022-10-14 DIAGNOSIS — G9511 Acute infarction of spinal cord (embolic) (nonembolic): Secondary | ICD-10-CM

## 2022-10-14 DIAGNOSIS — I10 Essential (primary) hypertension: Secondary | ICD-10-CM

## 2022-10-14 MED ORDER — MECLIZINE HCL 25 MG PO TABS: 25.0000 mg | ORAL_TABLET | Freq: Three times a day (TID) | ORAL | 5 refills | Status: AC | PRN

## 2022-10-14 MED ORDER — TRIAMCINOLONE ACETONIDE 0.1 % EX CREA: 1.0000 | TOPICAL_CREAM | Freq: Two times a day (BID) | CUTANEOUS | 1 refills | Status: AC

## 2022-10-14 MED ORDER — FOLIC ACID 1 MG PO TABS: 1.0000 mg | ORAL_TABLET | Freq: Every day | ORAL | 3 refills | Status: DC

## 2022-10-14 NOTE — Patient Instructions (Addendum)
Let us know when you get your TDap at the pharmacy  Encourage dentist follow up   Mineral oil for ear full of wax (OR just try debrox)  Purchase mineral oil from laxative aisle Lay down on your side with ear that is bothering you facing up Use 3-4 drops with a dropper and place in ear for 30 seconds Place cotton swab outside of ear Turn to other side and allow this to drain Repeat 3-4 x a day Return to see [place] if not improving within a few days   Please stop by lab before you go If you have mychart- we will send your results within 3 business days of [place] receiving them.  If you do not have mychart- we will call you about results within 5 business days of [place] receiving them.  *please also note that you will see labs on mychart as soon as they post. I will later go in and write notes on them- will say "notes from Dr. Durene Cal"   Recommended follow up: Return in about 1 year (around 10/14/2023) for physical or sooner if needed.Schedule b4 you leave.

## 2022-10-14 NOTE — Progress Notes (Signed)
Phone: [phone]   Subjective:  Patient presents today for their annual physical. Chief complaint-noted.   See problem oriented charting- ROS- full  review of systems was completed and negative  Per full ROS sheet completed by patient  The following were reviewed and entered/updated in epic: Past Medical History:  Diagnosis Date   BPH (benign prostatic hypertrophy)    Coronary artery disease    ERECTILE DYSFUNCTION, ORGANIC 05/16/2007   H/O: CVA (cardiovascular accident)    Hiatal hernia    wheelchair bound stroke residual   History of shingles 04/07/2009   Treated shingles 2010 s/p immunization       Hyperlipidemia    Hypertension    MI, old    SHINGLES 04/07/2009   Stroke (HCC) 2006   Patient Active Problem List   Diagnosis Date Noted   Recurrent UTI 05/17/2017    Priority: High   CAD (coronary artery disease) 05/23/2008    Priority: High   Spinal cord infarction (HCC) 01/05/2007    Priority: High   Hyperlipidemia 01/05/2007    Priority: Medium    Essential hypertension 01/05/2007    Priority: Medium    Eczema 07/10/2014    Priority: Low   Psoriasis and similar disorders 07/23/2013    Priority: Low   Vitamin B12 deficiency 10/17/2008    Priority: Low   GERD 08/24/2007    Priority: Low   UTI (urinary tract infection) 01/09/2007    Priority: Low   Low vitamin D level 03/01/2017   Past Surgical History:  Procedure Laterality Date   CARDIAC CATHETERIZATION     CORONARY ANGIOPLASTY      Family History  Problem Relation Age of Onset   Hypertension Other    Alcohol abuse Other    Hyperlipidemia Other    Heart disease Father     Medications- reviewed and updated Current Outpatient Medications  Medication Sig Dispense Refill   acetaminophen (TYLENOL) 500 MG tablet Take 1,000 mg by mouth every 6 (six) hours as needed for mild pain.     aspirin EC 81 MG tablet Take 81 mg by mouth every other day.     metoprolol tartrate (LOPRESSOR) 25 MG tablet TAKE 0.5  TABLETS BY MOUTH 2 TIMES DAILY. 90 tablet 3   nitrofurantoin (MACRODANTIN) 100 MG capsule Take 100 mg by mouth at bedtime.     omeprazole (PRILOSEC) 20 MG capsule Take 1 capsule (20 mg total) by mouth daily. 90 capsule 3   simvastatin (ZOCOR) 40 MG tablet Take 1 tablet (40 mg total) by mouth daily at 6 PM. 90 tablet 3   vitamin B-12 (CYANOCOBALAMIN) 500 MCG tablet Take 1 tablet (500 mcg total) by mouth daily. 90 tablet 3   folic acid (FOLVITE) 1 MG tablet Take 1 tablet (1 mg total) by mouth daily. 90 tablet 3   meclizine (ANTIVERT) 25 MG tablet Take 1 tablet (25 mg total) by mouth 3 (three) times daily as needed. 30 tablet 5   triamcinolone cream (KENALOG) 0.1 % Apply 1 Application topically 2 (two) times daily. For 7-10 days max with flare up 454 g 1   No current facility-administered medications for this visit.    Allergies-reviewed and updated No Known Allergies  Social History   Social History Narrative   Married (wife patient of Dr. Durene Cal). 2 sons. 1 granddaughter (2001).       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 Packs over 1000 eggs a day.       Hobbies: time with  wife and working at [employer].    Objective  Objective:  BP 124/72   Pulse 63   Temp 98.6 F (37 C)   Ht 5\' 8"  (1.727 m)   Wt 176 lb (79.8 kg)   SpO2 98%   BMI 26.76 kg/m  Gen: NAD, resting comfortably On neck there is 4 x 3 cm mobile lesion likely lipoma vs cyst (declines surgery referral for now)  HEENT: Mucous membranes are moist. Oropharynx normal. Cerumen impaction both sides- some mild itching on left reported (declines irrigation)  Neck: no thyromegaly CV: RRR no murmurs rubs or gallops Lungs: CTAB no crackles, wheeze, rhonchi Abdomen: soft/nontender/nondistended/normal bowel sounds. No rebound or guarding.  Ext: trace edema Skin: warm, dry Neuro: grossly normal, moves all extremities, PERRLA, quadriplegic in wheelchair    Assessment and Plan  82 y.o. male presenting  for annual physical.  Health Maintenance counseling: 1. Anticipatory guidance: Patient counseled regarding regular dental exams - advised q6 months, eye exams -yearly,  avoiding smoking and second hand smoke , limiting alcohol to 2 beverages per day - doesn't drink, no illicit drugs .   2. Risk factor reduction:  Advised patient of need for regular exercise and diet rich and fruits and vegetables to reduce risk of heart attack and stroke.  Exercise- exercise bike daily for 2 miles at least.  Diet/weight management-down 4 lbs from last year with official weight today- other weights were projections.  Wt Readings from Last 3 Encounters:  10/14/22 176 lb (79.8 kg)  06/26/21 180 lb (81.6 kg)  05/08/21 180 lb (81.6 kg)  3. Immunizations/screenings/ancillary studies- opts out of shingrix, Tetanus, Diphtheria, and Pertussis (Tdap) recommended today, opts out of covid Immunization History  Administered Date(s) Administered   Fluad Quad(high Dose 65+) 04/10/2019, 04/08/2021   Influenza Split 01/14/2011, 07/11/2012   Influenza Whole 02/21/2008, 02/18/2009, 02/16/2010   Influenza, High Dose Seasonal PF 03/05/2013, 03/01/2017, 03/14/2018   PFIZER(Purple Top)SARS-COV-2 Vaccination 05/25/2019, 06/25/2019   Pneumococcal Conjugate-13 07/10/2014   Pneumococcal Polysaccharide-23 10/01/2004, 07/11/2012   Td 04/22/1993, 10/02/2012   Zoster, Live 03/05/2013  4. Prostate cancer screening- past age based screening recommendations . Opts out of further testing . Also follows with Dr. Alvester Morin  5. Colon cancer screening - with current health issues opts out 6. Skin cancer screening- saw Dr. Jorja Loa last year- they plan to call around- considering [place]. advised regular sunscreen use. Denies worrisome, changing, or new skin lesions.  7. Smoking associated screening (lung cancer screening, AAA screen 65-75, UA)- former smoker- but quit in 1970s does not need lung cancer screening 8. STD screening - only active with  spouse  Status of chronic or acute concerns   # Spinal cord infarction # History of low B12 S: Patient with history of spinal cord infarction in 2006. Had prolonged rehab at [facility] in [place]. This spans from C4- T1. He had been wheelchair bound since this time as quadriplegic.   - was advised folic acid long term and b12- history of low B12- still taking A/P: noted/stable- continue to monitor   With B12 deficiency- update levels today. Also takes folic acid   #hypertension S: medication: Metoprolol 12.5 mg twice daily BP Readings from Last 3 Encounters:  10/14/22 124/72  06/26/21 126/76  05/08/21 120/78  A/P: stable- continue current medicines   # CAD-follows with Dr. Eden Emms #hyperlipidemia S: Medication:Simvastatin 40 mg, aspirin 81 mg every other day Symptoms:no chest pain or stable   Exercise:Exercise bike regularly /daily Lab Results  Component  Value Date   CHOL 151 05/08/2021   HDL 44.20 05/08/2021   LDLCALC 78 05/08/2021   LDLDIRECT 106.0 08/30/2017   TRIG 143.0 05/08/2021   CHOLHDL 3 05/08/2021   A/P: coronary artery disease asymptomatic continue current medications Lipids- update with labs- I prefer under 70 for LDL if possible but cardiology was ok with 78 last yera  #Vitamin D deficiency S: Medication: 1000 units every few days Last vitamin D Lab Results  Component Value Date   VD25OH 85.50 05/08/2021  A/P: hopefully stable- update vitamin D today. Continue current meds for now    #BPPV- intermittent issues- meclizine available - no recent issues  # UTI prophylaxis-remains on nitrofurantoin to Dr. Alvester Morin of urology. Also has keflex back up and cipro after that   # GERD-omeprazole 20 mg generally helpful-no issues as long as eats early enough  # Eczema-intermittent issues-triamcinolone available as needed    Recommended follow up: Return in about 1 year (around 10/14/2023) for physical or sooner if needed.Schedule b4 you leave. Future  Appointments  Date Time Provider Department Center  11/05/2022  8:45 AM Wendall Stade, MD [id number]   Lab/Order associations:NOT fasting   ICD-10-CM   1. Preventative health care  Z00.00     2. Coronary artery disease involving native coronary artery of native heart without angina pectoris  I25.10     3. Spinal cord infarction (HCC)  G95.11     4. Mixed hyperlipidemia  E78.2 Comprehensive metabolic panel    CBC with Differential/Platelet    Lipid panel    5. Essential hypertension  I10     6. Vitamin B12 deficiency  E53.8 Vitamin B12    7. Low vitamin D level  R79.89 VITAMIN D 25 Hydroxy (Vit-D Deficiency, Fractures)      Meds ordered this encounter  Medications   meclizine (ANTIVERT) 25 MG tablet    Sig: Take 1 tablet (25 mg total) by mouth 3 (three) times daily as needed.    Dispense:  30 tablet    Refill:  5   triamcinolone cream (KENALOG) 0.1 %    Sig: Apply 1 Application topically 2 (two) times daily. For 7-10 days max with flare up    Dispense:  454 g    Refill:  1   folic acid (FOLVITE) 1 MG tablet    Sig: Take 1 tablet (1 mg total) by mouth daily.    Dispense:  90 tablet    Refill:  3    Return precautions advised.  Tana Conch, MD

## 2022-10-15 LAB — CBC WITH DIFFERENTIAL/PLATELET
Basophils Absolute: 0.1 10*3/uL (ref 0.0–0.1)
Basophils Relative: 0.8 % (ref 0.0–3.0)
Eosinophils Absolute: 0.5 10*3/uL (ref 0.0–0.7)
Eosinophils Relative: 6.5 % — ABNORMAL HIGH (ref 0.0–5.0)
HCT: 42.2 % (ref 39.0–52.0)
Hemoglobin: 13.8 g/dL (ref 13.0–17.0)
Lymphocytes Relative: 20.8 % (ref 12.0–46.0)
Lymphs Abs: 1.5 10*3/uL (ref 0.7–4.0)
MCHC: 32.6 g/dL (ref 30.0–36.0)
MCV: 90.4 fl (ref 78.0–100.0)
Monocytes Absolute: 0.7 10*3/uL (ref 0.1–1.0)
Monocytes Relative: 9.2 % (ref 3.0–12.0)
Neutro Abs: 4.7 10*3/uL (ref 1.4–7.7)
Neutrophils Relative %: 62.7 % (ref 43.0–77.0)
Platelets: 233 10*3/uL (ref 150.0–400.0)
RBC: 4.66 Mil/uL (ref 4.22–5.81)
RDW: 13.7 % (ref 11.5–15.5)
WBC: 7.4 10*3/uL (ref 4.0–10.5)

## 2022-10-15 LAB — VITAMIN B12: Vitamin B-12: 708 pg/mL (ref 211–911)

## 2022-10-15 LAB — COMPREHENSIVE METABOLIC PANEL
ALT: 14 U/L (ref 0–53)
AST: 14 U/L (ref 0–37)
Albumin: 4 g/dL (ref 3.5–5.2)
Alkaline Phosphatase: 83 U/L (ref 39–117)
BUN: 17 mg/dL (ref 6–23)
CO2: 27 mEq/L (ref 19–32)
Calcium: 9.3 mg/dL (ref 8.4–10.5)
Chloride: 104 mEq/L (ref 96–112)
Creatinine, Ser: 0.9 mg/dL (ref 0.40–1.50)
GFR: 81 mL/min (ref >60.00)
Glucose, Bld: 99 mg/dL (ref 70–99)
Potassium: 4.4 mEq/L (ref 3.5–5.1)
Sodium: 139 mEq/L (ref 135–145)
Total Bilirubin: 0.4 mg/dL (ref 0.2–1.2)
Total Protein: 6.4 g/dL (ref 6.0–8.3)

## 2022-10-15 LAB — LIPID PANEL
Cholesterol: 140 mg/dL (ref 0–200)
HDL: 41.3 mg/dL (ref >39.00)
LDL Cholesterol: 62 mg/dL (ref 0–99)
NonHDL: 98.85
Total CHOL/HDL Ratio: 3
Triglycerides: 182 mg/dL — ABNORMAL HIGH (ref 0.0–149.0)
VLDL: 36.4 mg/dL (ref 0.0–40.0)

## 2022-10-15 LAB — VITAMIN D 25 HYDROXY (VIT D DEFICIENCY, FRACTURES): VITD: 48.58 ng/mL (ref 30.00–100.00)

## 2022-10-25 DIAGNOSIS — H524 Presbyopia: Secondary | ICD-10-CM | POA: Diagnosis not present

## 2022-10-26 NOTE — Progress Notes (Signed)
CARDIOLOGY CONSULT NOTE       Patient ID: Joseph Hayes MRN: [id number] DOB/AGE: 06/11/1940 82 y.o.  Primary Physician: Joseph Majestic, MD Primary Cardiologist: Joseph Hayes   HPI:  82 y.o. referred by Joseph Joseph Hayes for CAD. First seen by me 06/26/21  I see his wife Joseph Hayes as well. Previously seen by Joseph Joseph Hayes History of remote smoking, HLD, HTN.  Vague history of CAD not documented in chart/Epic with MI/stents in 1999 by Joseph Hayes Stroke in 2006 with spinal infarction and paralysis now in wheelchair Felt there was some relationship to starting Lipitor during stroke Has been on zocor with no issues Currently on ASA and lopressor for his CAD TAkes ASA qod due to bruising  LDL 78 05/08/21   Found his cath note from 02/05/98 had acute inferior MI with 4 mm  NIR stent to proximal RCA  With no significant left sided Dx Noted at time of MI to be "severely decreased EF " Needed to be intubated and sedated for procedure due to agitation and on dopamine   No chest pain Mild edema in left foot dependant Wife looks after him well He is active can move arms a bit Picks up chicken eggs daily and has a standing machine Uses In/Out foley 4 x / day  TTE done 07/13/21 EF 60-65% AV sclerosis   No cardiac symptoms   ROS All other systems reviewed and negative except as noted above  Past Medical History:  Diagnosis Date   BPH (benign prostatic hypertrophy)    Coronary artery disease    ERECTILE DYSFUNCTION, ORGANIC 05/16/2007   H/O: CVA (cardiovascular accident)    Hiatal hernia    wheelchair bound stroke residual   History of shingles 04/07/2009   Treated shingles 2010 s/p immunization       Hyperlipidemia    Hypertension    MI, old    SHINGLES 04/07/2009   Stroke (HCC) 2006    Family History  Problem Relation Age of Onset   Hypertension Other    Alcohol abuse Other    Hyperlipidemia Other    Heart disease Father     Social History   Socioeconomic History   Marital status: Married    Spouse  name: Not on file   Number of children: Not on file   Years of education: Not on file   Highest education level: Not on file  Occupational History   Occupation: self employed  Tobacco Use   Smoking status: Former    Types: Cigarettes    Quit date: 05/03/1974    Years since quitting: 48.5   Smokeless tobacco: Former  Substance and Sexual Activity   Alcohol use: No   Drug use: No   Sexual activity: Yes  Other Topics Concern   Not on file  Social History Narrative   Married (wife patient of Joseph. Durene Hayes). 2 sons. 1 granddaughter (2001).       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 Packs over 1000 eggs a day.       Hobbies: time with wife and working at [employer].    Social Determinants of Health   Financial Resource Strain: Not on file  Food Insecurity: Not on file  Transportation Needs: Not on file  Physical Activity: Not on file  Stress: Not on file  Social Connections: Not on file  Intimate Partner Violence: Not on file    Past Surgical History:  Procedure Laterality Date   CARDIAC CATHETERIZATION  CORONARY ANGIOPLASTY        Current Outpatient Medications:    acetaminophen (TYLENOL) 500 MG tablet, Take 1,000 mg by mouth every 6 (six) hours as needed for mild pain., Disp: , Rfl:    aspirin EC 81 MG tablet, Take 81 mg by mouth every other day., Disp: , Rfl:    folic acid (FOLVITE) 1 MG tablet, Take 1 tablet (1 mg total) by mouth daily., Disp: 90 tablet, Rfl: 3   meclizine (ANTIVERT) 25 MG tablet, Take 1 tablet (25 mg total) by mouth 3 (three) times daily as needed., Disp: 30 tablet, Rfl: 5   metoprolol tartrate (LOPRESSOR) 25 MG tablet, TAKE 0.5 TABLETS BY MOUTH 2 TIMES DAILY., Disp: 90 tablet, Rfl: 3   nitrofurantoin (MACRODANTIN) 100 MG capsule, Take 100 mg by mouth at bedtime., Disp: , Rfl:    omeprazole (PRILOSEC) 20 MG capsule, Take 1 capsule (20 mg total) by mouth daily., Disp: 90 capsule, Rfl: 3   simvastatin (ZOCOR) 40 MG tablet, Take 1  tablet (40 mg total) by mouth daily at 6 PM., Disp: 90 tablet, Rfl: 3   triamcinolone cream (KENALOG) 0.1 %, Apply 1 Application topically 2 (two) times daily. For 7-10 days max with flare up, Disp: 454 g, Rfl: 1   vitamin B-12 (CYANOCOBALAMIN) 500 MCG tablet, Take 1 tablet (500 mcg total) by mouth daily., Disp: 90 tablet, Rfl: 3    Physical Exam: Blood pressure 122/66, pulse (!) 57, height 5\' 9"  (1.753 m), weight 176 lb (79.8 kg), SpO2 97 %.    Affect appropriate Healthy:  appears stated age HEENT: normal Neck supple with no adenopathy JVP normal no bruits no thyromegaly Lungs clear with no wheezing and good diaphragmatic motion Heart:  S1/S2 no murmur, no rub, gallop or click PMI normal Abdomen: benighn, BS positve, no tenderness, no AAA no bruit.  No HSM or HJR Distal pulses intact with no bruits No edema Neuro quadriplegic  Skin warm and dry No muscular weakness   Labs:   Lab Results  Component Value Date   WBC 7.4 10/14/2022   HGB 13.8 10/14/2022   HCT 42.2 10/14/2022   MCV 90.4 10/14/2022   PLT 233.0 10/14/2022   No results for input(s): "NA", "K", "CL", "CO2", "BUN", "CREATININE", "CALCIUM", "PROT", "BILITOT", "ALKPHOS", "ALT", "AST", "GLUCOSE" in the last 168 hours.  Invalid input(s): "LABALBU" No results found for: "CKTOTAL", "CKMB", "CKMBINDEX", "TROPONINI"  Lab Results  Component Value Date   CHOL 140 10/14/2022   CHOL 151 05/08/2021   CHOL 154 07/18/2019   Lab Results  Component Value Date   HDL 41.30 10/14/2022   HDL 44.20 05/08/2021   HDL 44.90 07/18/2019   Lab Results  Component Value Date   LDLCALC 62 10/14/2022   LDLCALC 78 05/08/2021   LDLCALC 87 07/18/2019   Lab Results  Component Value Date   TRIG 182.0 (H) 10/14/2022   TRIG 143.0 05/08/2021   TRIG 112.0 07/18/2019   Lab Results  Component Value Date   CHOLHDL 3 10/14/2022   CHOLHDL 3 05/08/2021   CHOLHDL 3 07/18/2019   Lab Results  Component Value Date   LDLDIRECT 106.0  08/30/2017   LDLDIRECT 77.0 06/04/2015   LDLDIRECT 90.2 02/16/2010      Radiology: No results found.  EKG: 2019 SR rate 73 normal  11/05/2022 NSR rate 54 normal    ASSESSMENT AND PLAN:    CAD:  distant stent to proximal RCA in 1999 with no obstructive dx in left system Preserved EF  by TTE 07/13/21  Continue beta blocker , ASA and statin   HLD:  continue statin LDL acceptable 62 08/03/21   3.  HTN:  Well controlled.  Continue current medications and low sodium Dash type diet.    4. Quadriplegic:   History of spinal cord infarction f/u Joseph Joseph Hayes has some good routines at home including standing chair and he works with chickens every day  5. GERD:  continue prilosec low carb diet    F/U in a year   Signed: Charlton Haws 11/05/2022, 8:49 AM

## 2022-11-03 DIAGNOSIS — G9511 Acute infarction of spinal cord (embolic) (nonembolic): Secondary | ICD-10-CM | POA: Diagnosis not present

## 2022-11-03 DIAGNOSIS — R339 Retention of urine, unspecified: Secondary | ICD-10-CM | POA: Diagnosis not present

## 2022-11-03 DIAGNOSIS — Z87440 Personal history of urinary (tract) infections: Secondary | ICD-10-CM | POA: Diagnosis not present

## 2022-11-05 ENCOUNTER — Encounter: Payer: Self-pay | Admitting: Cardiovascular Disease

## 2022-11-05 ENCOUNTER — Ambulatory Visit: Payer: Medicare Other | Attending: Cardiovascular Disease | Admitting: Cardiovascular Disease

## 2022-11-05 VITALS — BP 122/66 | HR 57 | Ht 69.0 in | Wt 176.0 lb

## 2022-11-05 DIAGNOSIS — I10 Essential (primary) hypertension: Secondary | ICD-10-CM

## 2022-11-05 DIAGNOSIS — I2510 Atherosclerotic heart disease of native coronary artery without angina pectoris: Secondary | ICD-10-CM | POA: Diagnosis not present

## 2022-11-05 NOTE — Patient Instructions (Signed)
Medication Instructions:  Your physician recommends that you continue on your current medications as directed. Please refer to the Current Medication list given to you today.  *If you need a refill on your cardiac medications before your next appointment, please call your pharmacy*  Lab Work: If you have labs (blood work) drawn today and your tests are completely normal, you will receive your results only by: MyChart Message (if you have MyChart) OR A paper copy in the mail If you have any lab test that is abnormal or we need to change your treatment, we will call you to review the results.  Testing/Procedures: None ordered today.  Follow-Up: At [facility], you and your health needs are our priority.  As part of our continuing mission to provide you with exceptional heart care, we have created designated Provider Care Teams.  These Care Teams include your primary Cardiologist (physician) and Advanced Practice Providers (APPs -  Physician Assistants and Nurse Practitioners) who all work together to provide you with the care you need, when you need it.  We recommend signing up for the patient portal called "MyChart".  Sign up information is provided on this After Visit Summary.  MyChart is used to connect with patients for Virtual Visits (Telemedicine).  Patients are able to view lab/test results, encounter notes, upcoming appointments, etc.  Non-urgent messages can be sent to your provider as well.   To learn more about what you can do with MyChart, go to [email or web address].    Your next appointment:   1 year(s)  Provider:   Peter Nishan, MD      

## 2023-02-15 DIAGNOSIS — H02051 Trichiasis without entropian right upper eyelid: Secondary | ICD-10-CM | POA: Diagnosis not present

## 2023-03-05 DIAGNOSIS — G9511 Acute infarction of spinal cord (embolic) (nonembolic): Secondary | ICD-10-CM | POA: Diagnosis not present

## 2023-03-05 DIAGNOSIS — R339 Retention of urine, unspecified: Secondary | ICD-10-CM | POA: Diagnosis not present

## 2023-03-05 DIAGNOSIS — Z87440 Personal history of urinary (tract) infections: Secondary | ICD-10-CM | POA: Diagnosis not present

## 2023-06-03 DIAGNOSIS — H02051 Trichiasis without entropian right upper eyelid: Secondary | ICD-10-CM | POA: Diagnosis not present

## 2023-06-18 ENCOUNTER — Other Ambulatory Visit: Payer: Self-pay | Admitting: Family Medicine

## 2023-06-21 ENCOUNTER — Other Ambulatory Visit: Payer: Self-pay | Admitting: Family Medicine

## 2023-07-06 DIAGNOSIS — Z87440 Personal history of urinary (tract) infections: Secondary | ICD-10-CM | POA: Diagnosis not present

## 2023-07-06 DIAGNOSIS — R339 Retention of urine, unspecified: Secondary | ICD-10-CM | POA: Diagnosis not present

## 2023-07-06 DIAGNOSIS — G9511 Acute infarction of spinal cord (embolic) (nonembolic): Secondary | ICD-10-CM | POA: Diagnosis not present

## 2023-08-08 DIAGNOSIS — H02051 Trichiasis without entropian right upper eyelid: Secondary | ICD-10-CM | POA: Diagnosis not present

## 2023-08-12 ENCOUNTER — Other Ambulatory Visit: Payer: Self-pay | Admitting: Family Medicine

## 2023-08-15 ENCOUNTER — Other Ambulatory Visit: Payer: Self-pay | Admitting: Family Medicine

## 2023-10-15 DIAGNOSIS — G9511 Acute infarction of spinal cord (embolic) (nonembolic): Secondary | ICD-10-CM | POA: Diagnosis not present

## 2023-10-15 DIAGNOSIS — Z87440 Personal history of urinary (tract) infections: Secondary | ICD-10-CM | POA: Diagnosis not present

## 2023-10-15 DIAGNOSIS — R339 Retention of urine, unspecified: Secondary | ICD-10-CM | POA: Diagnosis not present

## 2023-11-12 ENCOUNTER — Other Ambulatory Visit: Payer: Self-pay | Admitting: Family Medicine

## 2023-12-01 DIAGNOSIS — H02051 Trichiasis without entropian right upper eyelid: Secondary | ICD-10-CM | POA: Diagnosis not present

## 2023-12-08 DIAGNOSIS — H524 Presbyopia: Secondary | ICD-10-CM | POA: Diagnosis not present

## 2023-12-20 ENCOUNTER — Other Ambulatory Visit: Payer: Self-pay | Admitting: Family Medicine

## 2024-01-05 DIAGNOSIS — K080 Exfoliation of teeth due to systemic causes: Secondary | ICD-10-CM | POA: Diagnosis not present

## 2024-01-09 ENCOUNTER — Other Ambulatory Visit: Payer: Self-pay | Admitting: Family Medicine

## 2024-01-31 NOTE — Progress Notes (Signed)
 CARDIOLOGY CONSULT NOTE       Patient ID: Joseph Hayes MRN: [id number] DOB/AGE: August 10, 1940 83 y.o.  Primary Physician: Katrinka Garnette KIDD, MD Primary Cardiologist: Delford   HPI:  83 y.o. referred by Dr Katrinka for CAD. First seen by me 06/26/21  I see [name] as well. Previously seen by Dr Pietro History of remote smoking, HLD, HTN.  Vague history of CAD not documented in chart/Epic with MI/stents in 1999 by Dr Pulsipher Stroke in 2006 with spinal infarction and paralysis now in wheelchair Felt there was some relationship to starting Lipitor during stroke Has been on zocor  with no issues Currently on ASA and lopressor  for his CAD TAkes ASA qod due to bruising  LDL 78 05/08/21   Found his cath note from 02/05/98 had acute inferior MI with 4 mm  NIR stent to proximal RCA  With no significant left sided Dx Noted at time of MI to be severely decreased EF  Needed to be intubated and sedated for procedure due to agitation and on dopamine   No chest pain Mild edema in left foot dependant Wife looks after him well He is active can move arms a bit Picks up chicken eggs daily and has a standing machine Uses In/Out foley 4 x / day  TTE done 07/13/21 EF 60-65% AV sclerosis   No cardiac symptoms  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   ROS All other systems reviewed and negative except as noted above  Past Medical History:  Diagnosis Date   BPH (benign prostatic hypertrophy)    Coronary artery disease    ERECTILE DYSFUNCTION, ORGANIC 05/16/2007   H/O: CVA (cardiovascular accident)    Hiatal hernia    wheelchair bound stroke residual   History of shingles 04/07/2009   Treated shingles 2010 s/p immunization       Hyperlipidemia    Hypertension    MI, old    SHINGLES 04/07/2009   Stroke (HCC) 2006    Family History  Problem Relation Age of Onset   Hypertension Other    Alcohol abuse Other    Hyperlipidemia Other     Heart disease Father     Social History   Socioeconomic History   Marital status: Married    Spouse name: Not on file   Number of children: Not on file   Years of education: Not on file   Highest education level: Not on file  Occupational History   Occupation: self employed  Tobacco Use   Smoking status: Former    Current packs/day: 0.00    Types: Cigarettes    Quit date: 05/03/1974    Years since quitting: 49.8   Smokeless tobacco: Former  Substance and Sexual Activity   Alcohol use: No   Drug use: No   Sexual activity: Yes  Other Topics Concern   Not on file  Social History Narrative   Married (wife patient of Dr. Katrinka). 2 sons. 1 granddaughter (2001).       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 Packs over 1000 eggs a day.       Hobbies: time with wife and working at [employer].    Social Drivers of Corporate investment banker Strain: Not on file  Food Insecurity: Not on file  Transportation Needs: Not on file  Physical Activity: Not on file  Stress: Not on file  Social Connections: Not  on file  Intimate Partner Violence: Not on file    Past Surgical History:  Procedure Laterality Date   CARDIAC CATHETERIZATION     CORONARY ANGIOPLASTY        Current Outpatient Medications:    acetaminophen  (TYLENOL ) 500 MG tablet, Take 1,000 mg by mouth every 6 (six) hours as needed for mild pain., Disp: , Rfl:    aspirin  EC 81 MG tablet, Take 81 mg by mouth every other day., Disp: , Rfl:    folic acid  (FOLVITE ) 1 MG tablet, TAKE 1 TABLET BY MOUTH EVERY DAY, Disp: 90 tablet, Rfl: 0   meclizine  (ANTIVERT ) 25 MG tablet, Take 1 tablet (25 mg total) by mouth 3 (three) times daily as needed., Disp: 30 tablet, Rfl: 5   metoprolol  tartrate (LOPRESSOR ) 25 MG tablet, TAKE 1/2 TABLET TWICE A DAY BY MOUTH, Disp: 90 tablet, Rfl: 2   nitrofurantoin (MACRODANTIN) 100 MG capsule, Take 100 mg by mouth at bedtime., Disp: , Rfl:    omeprazole  (PRILOSEC) 20 MG  capsule, TAKE 1 CAPSULE BY MOUTH EVERY DAY, Disp: 90 capsule, Rfl: 2   simvastatin  (ZOCOR ) 40 MG tablet, TAKE 1 TABLET BY MOUTH DAILY AT 6 PM., Disp: 90 tablet, Rfl: 1   triamcinolone  cream (KENALOG ) 0.1 %, Apply 1 Application topically 2 (two) times daily. For 7-10 days max with flare up, Disp: 454 g, Rfl: 1   vitamin B-12 (CYANOCOBALAMIN ) 500 MCG tablet, Take 1 tablet (500 mcg total) by mouth daily., Disp: 90 tablet, Rfl: 3    Physical Exam: Blood pressure 102/60, pulse 61, height 5' 10.8 (1.798 m), weight 185 lb (83.9 kg), SpO2 93%.    Affect appropriate Healthy:  appears stated age HEENT: normal Neck supple with no adenopathy JVP normal no bruits no thyromegaly Lungs clear with no wheezing and good diaphragmatic motion Heart:  S1/S2 no murmur, no rub, gallop or click PMI normal Abdomen: benighn, BS positve, no tenderness, no AAA no bruit.  No HSM or HJR Distal pulses intact with no bruits No edema Neuro quadriplegic  Skin warm and dry   Labs:   Lab Results  Component Value Date   WBC 7.4 10/14/2022   HGB 13.8 10/14/2022   HCT 42.2 10/14/2022   MCV 90.4 10/14/2022   PLT 233.0 10/14/2022   No results for input(s): NA, K, CL, CO2, BUN, CREATININE, CALCIUM , PROT, BILITOT, ALKPHOS, ALT, AST, GLUCOSE in the last 168 hours.  Invalid input(s): LABALBU No results found for: CKTOTAL, CKMB, CKMBINDEX, TROPONINI  Lab Results  Component Value Date   CHOL 140 10/14/2022   CHOL 151 05/08/2021   CHOL 154 07/18/2019   Lab Results  Component Value Date   HDL 41.30 10/14/2022   HDL 44.20 05/08/2021   HDL 44.90 07/18/2019   Lab Results  Component Value Date   LDLCALC 62 10/14/2022   LDLCALC 78 05/08/2021   LDLCALC 87 07/18/2019   Lab Results  Component Value Date   TRIG 182.0 (H) 10/14/2022   TRIG 143.0 05/08/2021   TRIG 112.0 07/18/2019   Lab Results  Component Value Date   CHOLHDL 3 10/14/2022   CHOLHDL 3 05/08/2021   CHOLHDL 3  07/18/2019   Lab Results  Component Value Date   LDLDIRECT 106.0 08/30/2017   LDLDIRECT 77.0 06/04/2015   LDLDIRECT 90.2 02/16/2010      Radiology: No results found.  EKG: 2019 SR rate 73 normal  02/13/2024 NSR rate 54 normal    ASSESSMENT AND PLAN:    CAD:  distant  stent to proximal RCA in 1999 with no obstructive dx in left system Preserved EF by TTE 07/13/21  Continue beta blocker , ASA and statin   HLD:  continue statin LDL acceptable 62 08/03/21   3.  HTN:  Well controlled.  Continue current medications and low sodium Dash type diet.    4. Quadriplegic:   History of spinal cord infarction f/u Dr Katrinka has some good routines at home including standing chair and he works with chickens every day  5. GERD:  continue prilosec low carb diet    F/U in a year   Signed: Maude Emmer 02/13/2024, 8:22 AM

## 2024-02-03 DIAGNOSIS — Z87440 Personal history of urinary (tract) infections: Secondary | ICD-10-CM | POA: Diagnosis not present

## 2024-02-03 DIAGNOSIS — G9511 Acute infarction of spinal cord (embolic) (nonembolic): Secondary | ICD-10-CM | POA: Diagnosis not present

## 2024-02-13 ENCOUNTER — Ambulatory Visit: Attending: Cardiovascular Disease | Admitting: Cardiovascular Disease

## 2024-02-13 VITALS — BP 102/60 | HR 61 | Ht 70.8 in | Wt 185.0 lb

## 2024-02-13 DIAGNOSIS — I10 Essential (primary) hypertension: Secondary | ICD-10-CM

## 2024-02-13 DIAGNOSIS — I2510 Atherosclerotic heart disease of native coronary artery without angina pectoris: Secondary | ICD-10-CM

## 2024-02-13 MED ORDER — HYDROCHLOROTHIAZIDE 25 MG PO TABS: 25.0000 mg | ORAL_TABLET | ORAL | 2 refills | Status: DC | PRN

## 2024-02-13 NOTE — Patient Instructions (Addendum)
 Medication Instructions:  Your physician recommends that you continue on your current medications as directed. Please refer to the Current Medication list given to you today.  Start Hydrochlorothiazide  25 mg as needed *If you need a refill on your cardiac medications before your next appointment, please call your pharmacy*  Lab Work: none If you have labs (blood work) drawn today and your tests are completely normal, you will receive your results only by: MyChart Message (if you have MyChart) OR A paper copy in the mail If you have any lab test that is abnormal or we need to change your treatment, we will call you to review the results.  Testing/Procedures: none  Follow-Up: At [facility], you and your health needs are our priority.  As part of our continuing mission to provide you with exceptional heart care, our providers are all part of one team.  This team includes your primary Cardiologist (physician) and Advanced Practice Providers or APPs (Physician Assistants and Nurse Practitioners) who all work together to provide you with the care you need, when you need it.  Your next appointment:   1 year(s)  Provider:   Maude Emmer, MD    We recommend signing up for the patient portal called MyChart.  Sign up information is provided on this After Visit Summary.  MyChart is used to connect with patients for Virtual Visits (Telemedicine).  Patients are able to view lab/test results, encounter notes, upcoming appointments, etc.  Non-urgent messages can be sent to your provider as well.   To learn more about what you can do with MyChart, go to [email or web address].   Other Instructions none

## 2024-02-24 ENCOUNTER — Encounter: Payer: Self-pay | Admitting: Family

## 2024-02-24 ENCOUNTER — Ambulatory Visit: Payer: Self-pay

## 2024-02-24 ENCOUNTER — Ambulatory Visit (INDEPENDENT_AMBULATORY_CARE_PROVIDER_SITE_OTHER): Admitting: Family

## 2024-02-24 VITALS — BP 138/80 | HR 58 | Temp 97.7°F | Ht 70.0 in | Wt 180.0 lb

## 2024-02-24 DIAGNOSIS — S93401A Sprain of unspecified ligament of right ankle, initial encounter: Secondary | ICD-10-CM

## 2024-02-24 NOTE — Telephone Encounter (Signed)
 FYI Only or Action Required?: FYI only for provider.  Patient was last seen in primary care on 10/14/2022 by Katrinka Garnette KIDD, MD.  Called Nurse Triage reporting Foot Injury.  Symptoms began several days ago.  Symptoms are: gradually worsening.  Triage Disposition: See Physician Within 24 Hours  Patient/caregiver understands and will follow disposition?: Yes     Copied from CRM [id number]. Topic: Clinical - Red Word Triage >> Feb 24, 2024 11:40 AM Suzen RAMAN wrote: Red Word that prompted transfer to Nurse Triage: recent foot injury; ankle is now swollen, red and hot to the touch. Requesting an appt. Patient wife on the line [name].      Reason for Disposition  [1] MODERATE pain (e.g., interferes with normal activities, limping) AND [2] high-risk adult (e.g., age > 60 years, osteoporosis, chronic steroid use)  Answer Assessment - Initial Assessment Questions 1. MECHANISM: How did the injury happen? (e.g., twisting injury, direct blow)      Foot got caught in a closing door  2. ONSET: When did the injury happen? (e.g., minutes or hours ago)      5-6 days ago 3. LOCATION: Where is the injury located?      Right foot  4. APPEARANCE of INJURY: What does the injury look like?      Red, swollen, and hot to touch  5. WEIGHT-BEARING: Can you put weight on that foot? Can you walk (four steps or more)?       Patient is in a wheelchair  6. SIZE: For cuts, bruises, or swelling, ask: How large is it? (e.g., inches or centimeters;  entire joint)      Swelling of foot and ankle goes up to mid-calf 7. PAIN: Is there pain? If Yes, ask: How bad is the pain? What does it keep you from doing? (Scale 0-10; or none, mild, moderate, severe)     Mild to moderate  8. TETANUS: For any breaks in the skin, ask: When was your last tetanus booster?     N/A 9. OTHER SYMPTOMS: Do you have any other symptoms?      No  Protocols used: Foot Injury-A-AH

## 2024-02-24 NOTE — Progress Notes (Signed)
 Patient ID: Joseph Hayes, male    DOB: 1941/04/09, 83 y.o.   MRN: [id number]  Chief Complaint  Patient presents with   Foot Injury    Pt c/o Right foot injury, present for 5 days. Pt c/o right Foot/ knee swelling and redness. Has tried tylenol  and elevating the leg.   Discussed the use of AI scribe software for clinical note transcription with the patient, who gave verbal consent to proceed.  History of Present Illness Joseph Hayes is an 83 year old male w/hx of spinal stroke, who presents with right foot pain and swelling after an injury.  He experienced right foot pain and swelling after catching his foot on a door while in a power chair. Initially, there was mild pain (though is mostly permanently numb) and discoloration, with swelling noted by Wednesday night. Swelling temporarily subsided by Thursday morning but worsened by Friday. The right foot is chronically larger than the left, but it is currently more swollen than usual. He denies significant pain but reports numbness and intermittent heat. Redness and heat have decreased, but swelling persists and may be moving towards the knee. There is no history of blood clots. He has not used ice or compression socks but has been elevating the foot some of the time. Hydrochlorothiazide is prescribed for swelling but has not been started. He takes Tylenol , one in the morning and one at night, for pain management. He uses a standing machine and a bike that moves his feet but is currently resting the foot.  Assessment & Plan Right foot and ankle sprain Swelling and bruising present, numbness (not new) and mild warmth noted, very minimal erythema. Fracture unlikely; potential cellulitis if redness worsens. Hydrochlorothiazide prescribed for swelling. - Elevate right foot and ankle frequently heart level or higher. - Apply ice to affected area 3 times daily for 30 minutes. - Start hydrochlorothiazide in the morning for 3 days. - Avoid standing  on affected foot for more than a few minutes at a time. Minimize use of standing machine. - Use Tylenol  Extra Strength 3 times daily for pain. - Ensure adequate hydration with hydrochlorothiazide. - Contact office Monday if no improvement or redness worsens.  Subjective:    Outpatient Medications Prior to Visit  Medication Sig Dispense Refill   acetaminophen  (TYLENOL ) 500 MG tablet Take 1,000 mg by mouth every 6 (six) hours as needed for mild pain.     aspirin  EC 81 MG tablet Take 81 mg by mouth every other day.     folic acid  (FOLVITE ) 1 MG tablet TAKE 1 TABLET BY MOUTH EVERY DAY 90 tablet 0   hydrochlorothiazide (HYDRODIURIL) 25 MG tablet Take 1 tablet (25 mg total) by mouth as needed. 30 tablet 2   meclizine  (ANTIVERT ) 25 MG tablet Take 1 tablet (25 mg total) by mouth 3 (three) times daily as needed. 30 tablet 5   metoprolol  tartrate (LOPRESSOR ) 25 MG tablet TAKE 1/2 TABLET TWICE A DAY BY MOUTH 90 tablet 2   nitrofurantoin (MACRODANTIN) 100 MG capsule Take 100 mg by mouth at bedtime.     omeprazole  (PRILOSEC) 20 MG capsule TAKE 1 CAPSULE BY MOUTH EVERY DAY 90 capsule 2   simvastatin  (ZOCOR ) 40 MG tablet TAKE 1 TABLET BY MOUTH DAILY AT 6 PM. 90 tablet 1   triamcinolone  cream (KENALOG ) 0.1 % Apply 1 Application topically 2 (two) times daily. For 7-10 days max with flare up 454 g 1   vitamin B-12 (CYANOCOBALAMIN ) 500 MCG tablet Take 1  tablet (500 mcg total) by mouth daily. 90 tablet 3   No facility-administered medications prior to visit.   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  Constitutional:      General: He is not in acute distress.    Appearance: Normal appearance.  HENT:     Head: Normocephalic.  Cardiovascular:     Rate and Rhythm: Normal rate and regular rhythm.  Pulmonary:     Effort: Pulmonary effort is normal.     Breath sounds: Normal breath sounds.  Musculoskeletal:     Cervical back: Normal range of motion.     Right lower leg: 4+ Edema (minimal erythema) present.     Left lower leg: 2+ Edema present.     Right ankle: Swelling and ecchymosis present. Decreased range of motion.     Right foot: Decreased range of motion. Swelling present. No tenderness.  Feet:     Right foot:     Skin integrity: Erythema (mild) and warmth (mild) present.  Skin:    General: Skin is warm and dry.  Neurological:     Mental Status: He is alert and oriented to person, place, and time.  Psychiatric:        Mood and Affect: Mood normal.     BP 138/80   Pulse (!) 58   Temp 97.7 F (36.5 C) (Temporal)   Ht 5' 10 (1.778 m)   Wt 180 lb (81.6 kg)   SpO2 97%   BMI 25.83 kg/m  Wt Readings from Last 3 Encounters:  02/24/24 180 lb (81.6 kg)  02/13/24 185 lb (83.9 kg)  11/05/22 176 lb (79.8 kg)      Lucius Krabbe, NP

## 2024-02-28 ENCOUNTER — Other Ambulatory Visit: Payer: Self-pay | Admitting: Family Medicine

## 2024-02-28 ENCOUNTER — Other Ambulatory Visit: Payer: Self-pay | Admitting: Cardiovascular Disease

## 2024-03-09 ENCOUNTER — Telehealth: Payer: Self-pay | Admitting: Family Medicine

## 2024-03-09 NOTE — Telephone Encounter (Signed)
 Left vm for [facility] wheelchair repair dept.    Copied from CRM [id number]. Topic: Clinical - Order For Equipment >> Mar 09, 2024  3:43 PM Berneda FALCON wrote: Reason for CRM: Wife [name] states that patient has a wheelchair and needs an estate manager/land agent prescription from [facility]. Fax number [phone] attention to Sarah Telephone-[phone] (said they can provide the verbiage) Located in [place]  Additionally they need an dance movement psychotherapist of referral for PT and OT wheelchair seating evaluation-was told they would have to go to [facility] from the [facility]

## 2024-03-16 ENCOUNTER — Encounter: Payer: Self-pay | Admitting: Family Medicine

## 2024-03-16 ENCOUNTER — Ambulatory Visit (INDEPENDENT_AMBULATORY_CARE_PROVIDER_SITE_OTHER): Admitting: Family Medicine

## 2024-03-16 VITALS — BP 138/64 | HR 69 | Temp 97.9°F | Ht 70.0 in | Wt 181.6 lb

## 2024-03-16 DIAGNOSIS — G9511 Acute infarction of spinal cord (embolic) (nonembolic): Secondary | ICD-10-CM

## 2024-03-16 DIAGNOSIS — G8250 Quadriplegia, unspecified: Secondary | ICD-10-CM | POA: Diagnosis not present

## 2024-03-16 DIAGNOSIS — E782 Mixed hyperlipidemia: Secondary | ICD-10-CM

## 2024-03-16 DIAGNOSIS — R739 Hyperglycemia, unspecified: Secondary | ICD-10-CM

## 2024-03-16 DIAGNOSIS — I2510 Atherosclerotic heart disease of native coronary artery without angina pectoris: Secondary | ICD-10-CM

## 2024-03-16 DIAGNOSIS — R7989 Other specified abnormal findings of blood chemistry: Secondary | ICD-10-CM

## 2024-03-16 DIAGNOSIS — Z131 Encounter for screening for diabetes mellitus: Secondary | ICD-10-CM

## 2024-03-16 DIAGNOSIS — I10 Essential (primary) hypertension: Secondary | ICD-10-CM | POA: Diagnosis not present

## 2024-03-16 DIAGNOSIS — E538 Deficiency of other specified B group vitamins: Secondary | ICD-10-CM

## 2024-03-16 NOTE — Patient Instructions (Signed)
 Schedule a lab visit at the check out desk within 2-3 weeks. Return for future fasting labs meaning nothing but water after midnight please. Ok to take your medications with water.   We will fax over note after completed  Call [facility] in 1-2 weeks to make sure they have referral info to try to schedule  If needed we may need to do a virtual early in the year on new insurance  Recommended follow up: Return in about 6 months (around 09/13/2024) for physical or sooner if needed.Schedule b4 you leave. Or at least yarly

## 2024-03-16 NOTE — Progress Notes (Signed)
 Phone [phone] In person visit   Subjective:   Joseph Hayes is a 83 y.o. year old very pleasant male patient who presents for/with See problem oriented charting Chief Complaint  Patient presents with   Referral    Daniel Mcalpine- [facility] Referral for PT OT wheelchair seating evaluation. Thinks paperwork was sent to us .    Hypertension    Past Medical History-  Patient Active Problem List   Diagnosis Date Noted   Recurrent UTI 05/17/2017    Priority: High   CAD (coronary artery disease) 05/23/2008    Priority: High   Spinal cord infarction (HCC) 01/05/2007    Priority: High   Hyperlipidemia 01/05/2007    Priority: Medium    Essential hypertension 01/05/2007    Priority: Medium    Eczema 07/10/2014    Priority: Low   Psoriasis and similar disorders 07/23/2013    Priority: Low   Vitamin B12 deficiency 10/17/2008    Priority: Low   GERD 08/24/2007    Priority: Low   UTI (urinary tract infection) 01/09/2007    Priority: Low   Quadriplegia, unspecified (HCC) 03/16/2024   Low vitamin D  level 03/01/2017    Medications- reviewed and updated Current Outpatient Medications  Medication Sig Dispense Refill   acetaminophen  (TYLENOL ) 500 MG tablet Take 1,000 mg by mouth every 6 (six) hours as needed for mild pain.     aspirin  EC 81 MG tablet Take 81 mg by mouth every other day.     cephALEXin  (KEFLEX ) 500 MG capsule Take 500 mg by mouth 3 (three) times daily. (Patient taking differently: Take 500 mg by mouth as needed.)     folic acid  (FOLVITE ) 1 MG tablet TAKE 1 TABLET BY MOUTH EVERY DAY 90 tablet 0   hydrochlorothiazide (HYDRODIURIL) 25 MG tablet Take 1 tablet (25 mg total) by mouth as needed. 90 tablet 3   meclizine  (ANTIVERT ) 25 MG tablet Take 1 tablet (25 mg total) by mouth 3 (three) times daily as needed. 30 tablet 5   metoprolol  tartrate (LOPRESSOR ) 25 MG tablet TAKE 1/2 TABLET TWICE A DAY BY MOUTH 90 tablet 2   nitrofurantoin (MACRODANTIN) 100 MG capsule Take 100 mg  by mouth at bedtime.     omeprazole  (PRILOSEC) 20 MG capsule TAKE 1 CAPSULE BY MOUTH EVERY DAY 90 capsule 2   simvastatin  (ZOCOR ) 40 MG tablet TAKE 1 TABLET BY MOUTH DAILY AT 6 PM. 90 tablet 1   triamcinolone  cream (KENALOG ) 0.1 % Apply 1 Application topically 2 (two) times daily. For 7-10 days max with flare up 454 g 1   vitamin B-12 (CYANOCOBALAMIN ) 500 MCG tablet Take 1 tablet (500 mcg total) by mouth daily. 90 tablet 3   No current facility-administered medications for this visit.     Objective:  BP 138/64   Pulse 69   Temp 97.9 F (36.6 C) (Temporal)   Ht 5' 10 (1.778 m)   Wt 181 lb 9.6 oz (82.4 kg)   SpO2 97%   BMI 26.06 kg/m  Gen: NAD, resting comfortably CV: RRR no murmurs rubs or gallops Lungs: CTAB no crackles, wheeze, rhonchi Ext: 1+ edema worse on right Skin: warm, dry    Assessment and Plan   # Spinal cord infarction with quadriplegia # History of low B12 S: Patient with history of spinal cord infarction in 2006. Had prolonged rehab at [facility] in [place]. This spans from C4- T1. He had been wheelchair bound since this time as quadriplegic.   -uses power chair  as long as functional- needs repairs- changing to [employer] first of year and they should pay for repairs. Apparently [employer] never paid for the motorized wheelchair and not covering repairs. They have already had a lot of expense that is not getting covered by insurance but new insurance should cover  As His power wheelchair needs to be repaired-we have already sent over an order to [facility] for his medical wheelchair repair   -They are also in need of PT and OT a wheelchair seating eval which is near [facility] in [place] told face-to-face referral today to PT OT evaluation  - was advised folic acid  long term and b12- history of low B12  Lab Results  Component Value Date   VITAMINB12 708 10/14/2022  A/P: spinal cord infarct- necessitating power wheelchair. Significant repair issues on  current chair and Will be due for new wheelchair soon -On the visit date of 03/16/2024 the patient was evaluated for power mobility needs.  This patient has mobility limitation due to spinal cord infarct with quadriplegia that prevents accomplishing ADLs.  This cannot be resolved by the use of fitted cane or walker.  This patient does not have sufficient arm function due to spinal infarct that spans from C4-T1 to propel a wheelchair to perform mobility related ADLs.  This patient could benefit from the use of specifically configured power wheelchair and can safely transfer to and from the power wheelchair and operate the controls. A problem overview has been ruled out.  -also recheck B12 when he comes back-reports still 1-reports still on B12 supplement   #hypertension S: medication: Metoprolol  12.5 mg twice daily BP Readings from Last 3 Encounters:  03/16/24 138/64  02/24/24 138/80  02/13/24 102/60  A/P: well controlled continue current medications   # CAD-follows with Dr. Nishan #hyperlipidemia S: Medication:Simvastatin  40 mg, aspirin  81 mg every other day Symptoms: no chest pain or shortness of breath   Exercise: still doing Exercise bike Lab Results  Component Value Date   CHOL 140 10/14/2022   HDL 41.30 10/14/2022   LDLCALC 62 10/14/2022   LDLDIRECT 106.0 08/30/2017   TRIG 182.0 (H) 10/14/2022   CHOLHDL 3 10/14/2022  A/P: CAD asymptomatic but exercise limited- thankfully doing ok on bike- continue current medications Lipids at goal last year- will come back for lipids  #Vitamin D  deficiency S: Medication: 1000 units every other day Lab Results  Component Value Date   VD25OH 48.58 10/14/2022  A/P: hopefully stable- update vitamin D  today. Continue current meds for now    #BPPV- intermittent issues- meclizine  available but thankfully no recent issues  # UTI prophylaxis-remains on nitrofurantoin to Dr. Carolee of urology-has Keflex  available with early signs of UTI as  well  #right ankle sprain doing better   Recommended follow up: Return in about 6 months (around 09/13/2024) for physical or sooner if needed.Schedule b4 you leave. Future Appointments  Date Time Provider Department Center  09/25/2024  9:00 AM Katrinka Garnette KIDD, MD [facility]    Lab/Order associations: banana sandwich, [facility]   ICD-10-CM   1. Spinal cord infarction [facility])  G95.11 Ambulatory referral to Physical Therapy    Ambulatory referral to Occupational Therapy    2. Quadriplegia, unspecified (HCC)  G82.50     3. Mixed hyperlipidemia  E78.2 Comprehensive metabolic panel with GFR    CBC with Differential/Platelet    Lipid panel    4. Coronary artery disease involving native coronary artery of native heart without angina pectoris  I25.10     5. Essential hypertension  I10 Comprehensive metabolic panel with GFR    CBC with Differential/Platelet    Lipid panel    6. Vitamin B12 deficiency  E53.8 Vitamin B12    7. Low vitamin D  level  R79.89 VITAMIN D  25 Hydroxy (Vit-D Deficiency, Fractures)    8. Screening for diabetes mellitus  Z13.1 Hemoglobin A1c    9. Hyperglycemia  R73.9 Hemoglobin A1c      No orders of the defined types were placed in this encounter.   Return precautions advised.  Garnette Lukes, MD

## 2024-05-10 ENCOUNTER — Other Ambulatory Visit: Payer: Self-pay | Admitting: Family Medicine

## 2024-05-10 ENCOUNTER — Telehealth: Payer: Self-pay | Admitting: Family Medicine

## 2024-05-10 NOTE — Telephone Encounter (Signed)
 Spoke with patients wife and let her know to call [facility] for this script as he sees them and they better mange his urinary case and scripts.   Copied from CRM [id number]. Topic: Clinical - Medical Advice >> May 10, 2024  4:43 PM Chasity T wrote: Reason for CRM: wife is calling stating that they switched insurance and they need a new prescription for 3-4 month supply for 450 units self cath straight intermittent catheter  14 fr 16  and lubricating gel 4 oz flip top tube.  Fax number: [phone]

## 2024-05-25 ENCOUNTER — Other Ambulatory Visit: Payer: Self-pay | Admitting: Family Medicine

## 2024-09-25 ENCOUNTER — Encounter: Admitting: Family Medicine
